# Patient Record
Sex: Female | Born: 1950
Health system: Southern US, Community
[De-identification: ages and names within clinical notes are randomized; demographics above are authoritative.]

## PROBLEM LIST (undated history)

## (undated) DIAGNOSIS — R519 Headache, unspecified: Secondary | ICD-10-CM

## (undated) DIAGNOSIS — J189 Pneumonia, unspecified organism: Secondary | ICD-10-CM

## (undated) DIAGNOSIS — F329 Major depressive disorder, single episode, unspecified: Secondary | ICD-10-CM

## (undated) DIAGNOSIS — T7840XA Allergy, unspecified, initial encounter: Secondary | ICD-10-CM

## (undated) DIAGNOSIS — H269 Unspecified cataract: Secondary | ICD-10-CM

## (undated) DIAGNOSIS — M797 Fibromyalgia: Secondary | ICD-10-CM

## (undated) DIAGNOSIS — M25512 Pain in left shoulder: Secondary | ICD-10-CM

## (undated) DIAGNOSIS — M1990 Unspecified osteoarthritis, unspecified site: Secondary | ICD-10-CM

## (undated) DIAGNOSIS — M75120 Complete rotator cuff tear or rupture of unspecified shoulder, not specified as traumatic: Secondary | ICD-10-CM

## (undated) DIAGNOSIS — I10 Essential (primary) hypertension: Secondary | ICD-10-CM

## (undated) DIAGNOSIS — E039 Hypothyroidism, unspecified: Secondary | ICD-10-CM

## (undated) DIAGNOSIS — R011 Cardiac murmur, unspecified: Secondary | ICD-10-CM

## (undated) DIAGNOSIS — F32A Depression, unspecified: Secondary | ICD-10-CM

## (undated) DIAGNOSIS — Z8619 Personal history of other infectious and parasitic diseases: Secondary | ICD-10-CM

## (undated) HISTORY — DX: Unspecified cataract: H26.9

## (undated) HISTORY — DX: Allergy, unspecified, initial encounter: T78.40XA

## (undated) HISTORY — PX: FRACTURE SURGERY: SHX138

## (undated) HISTORY — PX: SPINE SURGERY: SHX786

## (undated) HISTORY — PX: EYE SURGERY: SHX253

## (undated) HISTORY — PX: TONSILLECTOMY: SUR1361

## (undated) HISTORY — PX: BUNIONECTOMY: SHX129

## (undated) HISTORY — PX: OTHER SURGICAL HISTORY: SHX169

## (undated) HISTORY — PX: JOINT REPLACEMENT: SHX530

## (undated) HISTORY — PX: BREAST SURGERY: SHX581

## (undated) HISTORY — PX: TOTAL ABDOMINAL HYSTERECTOMY: SHX209

## (undated) HISTORY — PX: MANDIBLE FRACTURE SURGERY: SHX706

---

## 1976-07-18 HISTORY — PX: TUBAL LIGATION: SHX77

## 1979-07-19 HISTORY — PX: FRACTURE SURGERY: SHX138

## 1985-07-18 HISTORY — PX: BREAST BIOPSY: SHX20

## 2007-03-21 ENCOUNTER — Ambulatory Visit: Payer: Self-pay

## 2007-05-14 ENCOUNTER — Ambulatory Visit: Payer: Self-pay

## 2008-06-02 ENCOUNTER — Ambulatory Visit: Payer: Self-pay | Admitting: Family Medicine

## 2008-06-04 ENCOUNTER — Ambulatory Visit: Payer: Self-pay | Admitting: Family Medicine

## 2009-06-23 ENCOUNTER — Ambulatory Visit: Payer: Self-pay | Admitting: Internal Medicine

## 2009-07-18 HISTORY — PX: COLONOSCOPY: SHX174

## 2010-04-19 ENCOUNTER — Ambulatory Visit: Payer: Self-pay | Admitting: Family Medicine

## 2012-08-18 DIAGNOSIS — Z8739 Personal history of other diseases of the musculoskeletal system and connective tissue: Secondary | ICD-10-CM | POA: Insufficient documentation

## 2013-08-14 DIAGNOSIS — B999 Unspecified infectious disease: Secondary | ICD-10-CM | POA: Insufficient documentation

## 2013-12-25 ENCOUNTER — Ambulatory Visit: Payer: Self-pay | Admitting: Family Medicine

## 2014-11-03 DIAGNOSIS — E039 Hypothyroidism, unspecified: Secondary | ICD-10-CM | POA: Diagnosis not present

## 2014-11-21 DIAGNOSIS — M797 Fibromyalgia: Secondary | ICD-10-CM | POA: Diagnosis not present

## 2014-11-21 DIAGNOSIS — Z86010 Personal history of colonic polyps: Secondary | ICD-10-CM | POA: Diagnosis not present

## 2014-11-21 DIAGNOSIS — R5382 Chronic fatigue, unspecified: Secondary | ICD-10-CM | POA: Diagnosis not present

## 2014-11-21 DIAGNOSIS — I10 Essential (primary) hypertension: Secondary | ICD-10-CM | POA: Diagnosis not present

## 2014-11-21 DIAGNOSIS — B949 Sequelae of unspecified infectious and parasitic disease: Secondary | ICD-10-CM | POA: Diagnosis not present

## 2014-11-21 DIAGNOSIS — Z1239 Encounter for other screening for malignant neoplasm of breast: Secondary | ICD-10-CM | POA: Diagnosis not present

## 2014-11-21 DIAGNOSIS — E063 Autoimmune thyroiditis: Secondary | ICD-10-CM | POA: Diagnosis not present

## 2014-11-27 DIAGNOSIS — R5382 Chronic fatigue, unspecified: Secondary | ICD-10-CM | POA: Diagnosis not present

## 2014-11-27 DIAGNOSIS — Z0000 Encounter for general adult medical examination without abnormal findings: Secondary | ICD-10-CM | POA: Diagnosis not present

## 2014-11-27 DIAGNOSIS — M797 Fibromyalgia: Secondary | ICD-10-CM | POA: Diagnosis not present

## 2014-11-27 DIAGNOSIS — Z23 Encounter for immunization: Secondary | ICD-10-CM | POA: Diagnosis not present

## 2014-11-27 DIAGNOSIS — E063 Autoimmune thyroiditis: Secondary | ICD-10-CM | POA: Diagnosis not present

## 2014-11-27 DIAGNOSIS — I10 Essential (primary) hypertension: Secondary | ICD-10-CM | POA: Diagnosis not present

## 2014-11-27 DIAGNOSIS — M25512 Pain in left shoulder: Secondary | ICD-10-CM | POA: Diagnosis not present

## 2014-12-16 DIAGNOSIS — M7542 Impingement syndrome of left shoulder: Secondary | ICD-10-CM | POA: Diagnosis not present

## 2014-12-18 ENCOUNTER — Other Ambulatory Visit: Payer: Self-pay | Admitting: Unknown Physician Specialty

## 2014-12-18 DIAGNOSIS — M7542 Impingement syndrome of left shoulder: Secondary | ICD-10-CM

## 2014-12-30 ENCOUNTER — Ambulatory Visit
Admission: RE | Admit: 2014-12-30 | Discharge: 2014-12-30 | Disposition: A | Discharge status: Home / Self Care | Payer: Commercial Managed Care - HMO | Source: Ambulatory Visit | Attending: Unknown Physician Specialty | Admitting: Unknown Physician Specialty

## 2014-12-30 DIAGNOSIS — M25512 Pain in left shoulder: Secondary | ICD-10-CM | POA: Diagnosis not present

## 2014-12-30 DIAGNOSIS — M7552 Bursitis of left shoulder: Secondary | ICD-10-CM | POA: Insufficient documentation

## 2014-12-30 DIAGNOSIS — M7542 Impingement syndrome of left shoulder: Secondary | ICD-10-CM

## 2014-12-30 DIAGNOSIS — M75102 Unspecified rotator cuff tear or rupture of left shoulder, not specified as traumatic: Secondary | ICD-10-CM | POA: Diagnosis not present

## 2014-12-31 ENCOUNTER — Other Ambulatory Visit: Payer: Self-pay | Admitting: Internal Medicine

## 2014-12-31 ENCOUNTER — Encounter: Payer: Self-pay | Admitting: Internal Medicine

## 2014-12-31 DIAGNOSIS — I10 Essential (primary) hypertension: Secondary | ICD-10-CM | POA: Insufficient documentation

## 2014-12-31 DIAGNOSIS — M179 Osteoarthritis of knee, unspecified: Secondary | ICD-10-CM | POA: Insufficient documentation

## 2014-12-31 DIAGNOSIS — E079 Disorder of thyroid, unspecified: Secondary | ICD-10-CM | POA: Insufficient documentation

## 2014-12-31 DIAGNOSIS — M1990 Unspecified osteoarthritis, unspecified site: Secondary | ICD-10-CM | POA: Insufficient documentation

## 2014-12-31 DIAGNOSIS — F32A Depression, unspecified: Secondary | ICD-10-CM | POA: Insufficient documentation

## 2014-12-31 DIAGNOSIS — F329 Major depressive disorder, single episode, unspecified: Secondary | ICD-10-CM | POA: Insufficient documentation

## 2015-01-30 ENCOUNTER — Encounter: Payer: Self-pay | Admitting: Unknown Physician Specialty

## 2015-01-30 ENCOUNTER — Ambulatory Visit: Payer: Commercial Managed Care - HMO | Admitting: Anesthesiology

## 2015-01-30 ENCOUNTER — Encounter
Admission: RE | Disposition: A | Discharge status: Home / Self Care | Payer: Self-pay | Source: Ambulatory Visit | Attending: Unknown Physician Specialty

## 2015-01-30 ENCOUNTER — Ambulatory Visit
Admission: RE | Admit: 2015-01-30 | Discharge: 2015-01-30 | Disposition: A | Discharge status: Home / Self Care | Payer: Commercial Managed Care - HMO | Source: Ambulatory Visit | Attending: Unknown Physician Specialty | Admitting: Unknown Physician Specialty

## 2015-01-30 DIAGNOSIS — G8918 Other acute postprocedural pain: Secondary | ICD-10-CM | POA: Diagnosis not present

## 2015-01-30 DIAGNOSIS — Z79899 Other long term (current) drug therapy: Secondary | ICD-10-CM | POA: Diagnosis not present

## 2015-01-30 DIAGNOSIS — F329 Major depressive disorder, single episode, unspecified: Secondary | ICD-10-CM | POA: Diagnosis not present

## 2015-01-30 DIAGNOSIS — Z90710 Acquired absence of both cervix and uterus: Secondary | ICD-10-CM | POA: Insufficient documentation

## 2015-01-30 DIAGNOSIS — E079 Disorder of thyroid, unspecified: Secondary | ICD-10-CM | POA: Insufficient documentation

## 2015-01-30 DIAGNOSIS — I10 Essential (primary) hypertension: Secondary | ICD-10-CM | POA: Insufficient documentation

## 2015-01-30 DIAGNOSIS — M1990 Unspecified osteoarthritis, unspecified site: Secondary | ICD-10-CM | POA: Diagnosis not present

## 2015-01-30 DIAGNOSIS — Z8249 Family history of ischemic heart disease and other diseases of the circulatory system: Secondary | ICD-10-CM | POA: Insufficient documentation

## 2015-01-30 DIAGNOSIS — M75102 Unspecified rotator cuff tear or rupture of left shoulder, not specified as traumatic: Secondary | ICD-10-CM | POA: Diagnosis not present

## 2015-01-30 DIAGNOSIS — M75122 Complete rotator cuff tear or rupture of left shoulder, not specified as traumatic: Secondary | ICD-10-CM | POA: Diagnosis not present

## 2015-01-30 DIAGNOSIS — M7522 Bicipital tendinitis, left shoulder: Secondary | ICD-10-CM | POA: Insufficient documentation

## 2015-01-30 DIAGNOSIS — M7542 Impingement syndrome of left shoulder: Secondary | ICD-10-CM | POA: Insufficient documentation

## 2015-01-30 HISTORY — PX: SHOULDER ARTHROSCOPY WITH OPEN ROTATOR CUFF REPAIR: SHX6092

## 2015-01-30 HISTORY — DX: Major depressive disorder, single episode, unspecified: F32.9

## 2015-01-30 HISTORY — DX: Depression, unspecified: F32.A

## 2015-01-30 HISTORY — DX: Pain in left shoulder: M25.512

## 2015-01-30 HISTORY — DX: Essential (primary) hypertension: I10

## 2015-01-30 HISTORY — DX: Unspecified osteoarthritis, unspecified site: M19.90

## 2015-01-30 HISTORY — DX: Hypothyroidism, unspecified: E03.9

## 2015-01-30 HISTORY — DX: Cardiac murmur, unspecified: R01.1

## 2015-01-30 SURGERY — ARTHROSCOPY, SHOULDER WITH REPAIR, ROTATOR CUFF, OPEN
Anesthesia: Regional | Laterality: Left | Wound class: Clean

## 2015-01-30 MED ORDER — METOCLOPRAMIDE HCL 5 MG/ML IJ SOLN
INTRAMUSCULAR | Status: DC | PRN
  Administered 2015-01-30: 10 mg via INTRAVENOUS

## 2015-01-30 MED ORDER — LABETALOL HCL 5 MG/ML IV SOLN
INTRAVENOUS | Status: DC | PRN
  Administered 2015-01-30 (×4): 5 mg via INTRAVENOUS

## 2015-01-30 MED ORDER — FENTANYL CITRATE (PF) 100 MCG/2ML IJ SOLN
INTRAMUSCULAR | Status: DC | PRN
  Administered 2015-01-30: 100 ug via INTRAVENOUS
  Administered 2015-01-30: 50 ug via INTRAVENOUS

## 2015-01-30 MED ORDER — OXYCODONE-ACETAMINOPHEN 5-325 MG PO TABS: 1.0000 | ORAL_TABLET | Freq: Four times a day (QID) | ORAL | Status: DC | PRN

## 2015-01-30 MED ORDER — FENTANYL CITRATE (PF) 100 MCG/2ML IJ SOLN: 25.0000 ug | INTRAMUSCULAR | Status: DC | PRN

## 2015-01-30 MED ORDER — LIDOCAINE HCL (CARDIAC) 20 MG/ML IV SOLN
INTRAVENOUS | Status: DC | PRN
  Administered 2015-01-30: 30 mg via INTRAVENOUS

## 2015-01-30 MED ORDER — ACETAMINOPHEN 325 MG PO TABS: 325.0000 mg | ORAL_TABLET | ORAL | Status: DC | PRN

## 2015-01-30 MED ORDER — ONDANSETRON HCL 4 MG/2ML IJ SOLN
INTRAMUSCULAR | Status: DC | PRN
  Administered 2015-01-30: 4 mg via INTRAVENOUS

## 2015-01-30 MED ORDER — CEFAZOLIN SODIUM-DEXTROSE 2-3 GM-% IV SOLR
2.0000 g | Freq: Once | INTRAVENOUS | Status: AC
  Administered 2015-01-30: 2 g via INTRAVENOUS

## 2015-01-30 MED ORDER — ROPIVACAINE HCL 5 MG/ML IJ SOLN
INTRAMUSCULAR | Status: DC | PRN
  Administered 2015-01-30: 35 mL via PERINEURAL

## 2015-01-30 MED ORDER — DEXAMETHASONE SODIUM PHOSPHATE 4 MG/ML IJ SOLN
INTRAMUSCULAR | Status: DC | PRN
  Administered 2015-01-30: 4 mg via INTRAVENOUS
  Administered 2015-01-30: 4 mg via PERINEURAL

## 2015-01-30 MED ORDER — CEFAZOLIN SODIUM-DEXTROSE 2-3 GM-% IV SOLR
INTRAVENOUS | Status: DC | PRN
  Administered 2015-01-30: 2 g via INTRAVENOUS

## 2015-01-30 MED ORDER — OXYCODONE HCL 5 MG PO TABS: 5.0000 mg | ORAL_TABLET | Freq: Once | ORAL | Status: DC | PRN

## 2015-01-30 MED ORDER — MIDAZOLAM HCL 2 MG/2ML IJ SOLN
INTRAMUSCULAR | Status: DC | PRN
  Administered 2015-01-30: 2 mg via INTRAVENOUS
  Administered 2015-01-30: 1 mg via INTRAVENOUS

## 2015-01-30 MED ORDER — LACTATED RINGERS IV SOLN
INTRAVENOUS | Status: DC | PRN
  Administered 2015-01-30: 11100 mL

## 2015-01-30 MED ORDER — PROPOFOL 10 MG/ML IV BOLUS
INTRAVENOUS | Status: DC | PRN
  Administered 2015-01-30: 200 mg via INTRAVENOUS

## 2015-01-30 MED ORDER — ACETAMINOPHEN 160 MG/5ML PO SOLN: 325.0000 mg | ORAL | Status: DC | PRN

## 2015-01-30 MED ORDER — EPHEDRINE SULFATE 50 MG/ML IJ SOLN
INTRAMUSCULAR | Status: DC | PRN
  Administered 2015-01-30 (×6): 5 mg via INTRAVENOUS

## 2015-01-30 MED ORDER — LIDOCAINE HCL (CARDIAC) 20 MG/ML IV SOLN: INTRAVENOUS | Status: DC | PRN

## 2015-01-30 MED ORDER — ONDANSETRON HCL 4 MG/2ML IJ SOLN: 4.0000 mg | Freq: Once | INTRAMUSCULAR | Status: DC | PRN

## 2015-01-30 MED ORDER — GLYCOPYRROLATE 0.2 MG/ML IJ SOLN
INTRAMUSCULAR | Status: DC | PRN
  Administered 2015-01-30: .1 mg via INTRAVENOUS

## 2015-01-30 MED ORDER — LACTATED RINGERS IV SOLN
INTRAVENOUS | Status: DC
  Administered 2015-01-30 (×2): via INTRAVENOUS

## 2015-01-30 MED ORDER — OXYCODONE HCL 5 MG/5ML PO SOLN: 5.0000 mg | Freq: Once | ORAL | Status: DC | PRN

## 2015-01-30 SURGICAL SUPPLY — 77 items
ADAPTER IRRIG TUBE 2 SPIKE SOL (ADAPTER) ×6 IMPLANT
ANCHOR ALL-SUT Q-FIX 2.8 (Anchor) ×6 IMPLANT
ANCHOR SUT 5.5 SPEEDSCREW (Screw) ×6 IMPLANT
ARTHROWAND PARAGON T2 (SURGICAL WAND)
BLADE SURG 15 STRL LF DISP TIS (BLADE) IMPLANT
BLADE SURG 15 STRL SS (BLADE)
BUR BR 5.5 12 FLUTE (BURR) ×3 IMPLANT
BUR HOODED 3.0 ABRASION (BURR) IMPLANT
BUR RADIUS 4.0X18.5 (BURR) IMPLANT
BUR RADIUS 5.5 (BURR) IMPLANT
CANNULA 8.5X75 THRED (CANNULA) IMPLANT
CANNULA SHAVER 8MMX76MM (CANNULA) IMPLANT
CAP LOCK ULTRA CANNULA (MISCELLANEOUS) ×3 IMPLANT
CUTTER CANN W/HOLE 4.5 (CUTTER) ×3 IMPLANT
CUTTER SLOTTED WHISKER 4.0 (BURR) ×3 IMPLANT
DRAPE STERI 35X30 U-POUCH (DRAPES) ×3 IMPLANT
DURAPREP 26ML APPLICATOR (WOUND CARE) ×3 IMPLANT
GAUZE SPONGE 4X4 12PLY STRL (GAUZE/BANDAGES/DRESSINGS) ×3 IMPLANT
GLOVE BIO SURGEON STRL SZ7.5 (GLOVE) ×6 IMPLANT
GLOVE BIO SURGEON STRL SZ8 (GLOVE) ×3 IMPLANT
GLOVE INDICATOR 8.0 STRL GRN (GLOVE) ×6 IMPLANT
GOWN STRL REIN 2XL LVL4 (GOWN DISPOSABLE) IMPLANT
GOWN STRL REUS W/ TWL LRG LVL3 (GOWN DISPOSABLE) ×2 IMPLANT
GOWN STRL REUS W/TWL LRG LVL3 (GOWN DISPOSABLE) ×4
IV LACTATED RINGER IRRG 3000ML (IV SOLUTION) ×8
IV LR IRRIG 3000ML ARTHROMATIC (IV SOLUTION) ×4 IMPLANT
KIT SHOULDER TRACTION (DRAPES) ×3 IMPLANT
KIT SUTURE 2.8 Q-FIX DISP (MISCELLANEOUS) ×3 IMPLANT
MANIFOLD 4PT FOR NEPTUNE1 (MISCELLANEOUS) ×3 IMPLANT
NEEDLE 18GX1X1/2 (RX/OR ONLY) (NEEDLE) ×3 IMPLANT
NEEDLE MAYO CATGUT SZ 1.5 (NEEDLE)
NEEDLE MAYO CATGUT SZ 2 (NEEDLE) IMPLANT
NEEDLE SPNL 18GX3.5 QUINCKE PK (NEEDLE) IMPLANT
PACK ARTHROSCOPY SHOULDER (MISCELLANEOUS) IMPLANT
PAD GROUND ADULT SPLIT (MISCELLANEOUS) ×3 IMPLANT
PASSER SUT CAPTURE FIRST (SUTURE) ×3 IMPLANT
SET TUBE SUCT SHAVER OUTFL 24K (TUBING) ×3 IMPLANT
SLING ULTRA II M (MISCELLANEOUS) ×3 IMPLANT
SOL PREP PVP 2OZ (MISCELLANEOUS) ×3
SOLUTION PREP PVP 2OZ (MISCELLANEOUS) ×1 IMPLANT
STAPLER SKIN PROX 35W (STAPLE) ×3 IMPLANT
STRAP BODY AND KNEE 60X3 (MISCELLANEOUS) ×9 IMPLANT
SUCTION FRAZIER TIP 10 FR DISP (SUCTIONS) ×3 IMPLANT
SUT ETHIBOND NAB CT1 #1 30IN (SUTURE) ×3 IMPLANT
SUT ETHILON 3-0 FS-10 30 BLK (SUTURE) ×6
SUT MAGNUM WIRE 2 (SUTURE) IMPLANT
SUT PDS AB 1 CT1 27 (SUTURE) IMPLANT
SUT PDSII 0 (SUTURE) IMPLANT
SUT PERFECTPASSER WHITE CART (SUTURE) IMPLANT
SUT PROLENE 2 0 CT2 30 (SUTURE) IMPLANT
SUT SMART STITCH CARTRIDGE (SUTURE) IMPLANT
SUT TICRON 2-0 30IN 311381 (SUTURE) IMPLANT
SUT VIC AB 0 CT1 36 (SUTURE) IMPLANT
SUT VIC AB 0 CT2 27 (SUTURE) ×3 IMPLANT
SUT VIC AB 2-0 CT1 27 (SUTURE) ×2
SUT VIC AB 2-0 CT1 TAPERPNT 27 (SUTURE) ×1 IMPLANT
SUT VIC AB 2-0 CT2 27 (SUTURE) IMPLANT
SUT VIC AB 2-0 SH 27 (SUTURE)
SUT VIC AB 2-0 SH 27XBRD (SUTURE) IMPLANT
SUT VIC AB 3-0 SH 27 (SUTURE)
SUT VIC AB 3-0 SH 27X BRD (SUTURE) IMPLANT
SUTURE EHLN 3-0 FS-10 30 BLK (SUTURE) ×2 IMPLANT
SUTURE MAGNUM WIRE 2X48 BLK (SUTURE) IMPLANT
SYRINGE 10CC LL (SYRINGE) ×3 IMPLANT
TAPE MICROFOAM 4IN (TAPE) ×3 IMPLANT
TUBING ARTHRO INFLOW-ONLY STRL (TUBING) ×3 IMPLANT
WAND 30 DEG SABER W/CORD (SURGICAL WAND) IMPLANT
WAND ARTHRO PARAGON T2 (SURGICAL WAND) IMPLANT
WAND COVAC 50 IFS (MISCELLANEOUS) IMPLANT
WAND COVATOR 20 (MISCELLANEOUS) IMPLANT
WAND HAND CNTRL MULTIVAC 50 (MISCELLANEOUS) IMPLANT
WAND HAND CNTRL MULTIVAC 90 (MISCELLANEOUS) IMPLANT
WAND MEGAVAC 90 (MISCELLANEOUS) ×3 IMPLANT
WAND TENDON TOPAZ 0 ANGL (MISCELLANEOUS) IMPLANT
WAND TOPAZ EPF  WAS Q (MISCELLANEOUS)
WAND TOPAZ EPF WAS Q (MISCELLANEOUS) IMPLANT
WIRE MAGNUM (SUTURE) IMPLANT

## 2015-01-30 NOTE — Anesthesia Postprocedure Evaluation (Signed)
  Anesthesia Post-op Note  Patient: Deanna Johnson  Procedure(s) Performed: Procedure(s) with comments: SHOULDER ARTHROSCOPY WITH POSS OPEN ROTATOR CUFF REPAIR (Left) - Release of long head of biceps tendon Subacromial decompression Mini open rotator cuff repair  Anesthesia type:General LMA, Regional  Patient location: PACU  Post pain: Pain level controlled  Post assessment: Post-op Vital signs reviewed, Patient's Cardiovascular Status Stable, Respiratory Function Stable, Patent Airway and No signs of Nausea or vomiting  Post vital signs: Reviewed and stable  Last Vitals:  Filed Vitals:   01/30/15 1100  BP: 123/59  Pulse: 94  Temp:   Resp: 15    Level of consciousness: awake, alert  and patient cooperative  Complications: No apparent anesthesia complications

## 2015-01-30 NOTE — Anesthesia Procedure Notes (Signed)
Anesthesia Regional Block:  Interscalene brachial plexus block  Pre-Anesthetic Checklist: ,, timeout performed, Correct Patient, Correct Site, Correct Laterality, Correct Procedure, Correct Position, site marked, Risks and benefits discussed,  Surgical consent,  Pre-op evaluation,  At surgeon's request and post-op pain management  Laterality: Left  Prep: chloraprep       Needles:  Injection technique: Single-shot  Needle Type: Stimiplex     Needle Length: 10cm 10 cm Needle Gauge: 21 and 21 G    Additional Needles:  Procedures: ultrasound guided (picture in chart) Interscalene brachial plexus block Narrative:  Start time: 01/30/2015 6:59 AM End time: 01/30/2015 6:53 AM Injection made incrementally with aspirations every 5 mL.  Performed by: Personally  Anesthesiologist: Ronelle Nigh  Additional Notes: Functioning IV was confirmed and monitors applied. Ultrasound guidance: relevant anatomy identified, needle position confirmed, local anesthetic spread visualized around nerve(s)., vascular puncture avoided.  Image printed for medical record.  Negative aspiration and no paresthesias; incremental administration of local anesthetic. The patient tolerated the procedure well. Vitals signes recorded in RN notes.

## 2015-01-30 NOTE — Op Note (Signed)
01/30/2015  11:54 AM  Patient:   Deanna Johnson  Pre-Op Diagnosis:   IMPINGEMENT SYNDROME LEFT SHOULDER M75.42  Postoperative diagnosis: Impingement right shoulder plus bicipital tendinitis and small rotator cuff tear  Procedure: Arthroscopic subacromial decompression plus arthroscopic release of the long head of biceps tendon followed by mini incision rotator cuff repair left shoulder  Anesthesia: General endotracheal with interscalene block placed preoperatively by the anesthesiologist.  Findings: As above.   Complications: None  Estimated blood loss: negligable  Tourniquet time: None  Drains: None   Brief clinical note:  The patient's symptoms had progressed despite medications, activity modification, etc. The patient's history and examination were consistent with impingement symptomatology right shoulder and probable rotator cuff tear. These findings were confirmed by MRI scan. It was thought that the patient did have a small cuff tear on the MRI. The patient was admitted at this time for definitive management of these shoulder symptoms.  Procedure: The patient was brought into the operating room and placed in the supine position. The patient then underwent general endotracheal intubation and anesthesia before being repositioned in the lateral decubitus position. The left shoulder and upper extremity were prepped and draped in usual fashion. The shoulder was suspended with the Acufex shoulder suspension device. 10 pounds of traction was utilized. Preoperative antibiotics were administered. A timeout was performed . A posterior portal was created. The arthroscope was introduced into the glenohumeral joint. The joint was distended with lactated Ringer's. The glenohumeral joint was thoroughly inspected revealing grade 2-3 chondral changes in the glenoid and on the humeral head. These lesions were debrided with a turbo whisker.. An anterior portal was created. An  ArthroCare wand was inserted and used to obtain hemostasis as well as to perform a limited synovectomy.The biceps tendon was evaluated and then released from its labral attachment using an ArthroCare wand.  The scope was repositioned through the posterior portal into the subacromial space. A separate lateral portal was created using an outside-in technique. An ArthroCare 90 wand followed by a 4.0 full-radius resector was introduced and used to perform a subtotal bursectomy. The ArthroCare wand was then inserted and used to remove the periosteal tissue off the undersurface of the anterior third of the acromion as well as to recess the coracoacromial ligament from its attachment along the anterior and lateral margins of the acromion.   With the scope in the lateral portal a 5.30mm acromionizing bur was introduced through the posterior portal and used to perform the decompression by removing the undersurface of the anterior third of the acromion. At this time I was able to find a very small complete tear in the anterior portion of the supraspinatus insertion onto the greater tuberosity. The instruments were removed from the subacromial space.  An approximately 3 to 4 cm incision was made over the anterolateral aspect of the shoulder.   This incision was carried down through the subcutaneous tissues onto the deltoid. The deltoid was divided in line with its fibers to provide access into the subacromial space. The rotator cuff tear was readily identified. The margins were debrided. I then used a bur to lightly decorticate the greater tuberosity in the area of intended reattachment of the torn cuff. I used a small awl to make ventcement holes in the greater tuberosity at the area of the intended repair. The tear was repaired using 2 Q-Fix horizontal mattress sutures that were tied down and then crisscrossed over to 2 laterally placed 5.5 speed screws.   The wound was  copiously irrigated with sterile saline solution  before the deltoid was repaired to bone with #1 ethibond sutures.  Deltoid interval closed with 0 vicryl. The subcutaneous tissues were closed  using 2-0 Vicryl interrupted sutures before the skin was closed with 4-0 nylon sutures in vertical mattress fashion. The portal sites also were closed using 4-O nylon sutures. A sterile bulky dressing was applied to the shoulder followed by a shoulder immobilizer. The patient was then awakened, extubated, and returned to the recovery room in satisfactory condition after tolerating the procedure well.  Blood loss was negligible.

## 2015-01-30 NOTE — Anesthesia Preprocedure Evaluation (Signed)
Anesthesia Evaluation  Patient identified by MRN, date of birth, ID band  Reviewed: Allergy & Precautions, H&P , NPO status , Patient's Chart, lab work & pertinent test results  Airway Mallampati: II  TM Distance: >3 FB Neck ROM: full    Dental no notable dental hx.    Pulmonary former smoker,    Pulmonary exam normal       Cardiovascular hypertension, Rhythm:regular Rate:Normal     Neuro/Psych PSYCHIATRIC DISORDERS    GI/Hepatic   Endo/Other  Hypothyroidism Morbid obesity  Renal/GU      Musculoskeletal   Abdominal   Peds  Hematology   Anesthesia Other Findings   Reproductive/Obstetrics                             Anesthesia Physical Anesthesia Plan  ASA: II  Anesthesia Plan: General LMA and Regional   Post-op Pain Management: GA combined w/ Regional for post-op pain   Induction:   Airway Management Planned:   Additional Equipment:   Intra-op Plan:   Post-operative Plan:   Informed Consent: I have reviewed the patients History and Physical, chart, labs and discussed the procedure including the risks, benefits and alternatives for the proposed anesthesia with the patient or authorized representative who has indicated his/her understanding and acceptance.     Plan Discussed with: CRNA  Anesthesia Plan Comments:         Anesthesia Quick Evaluation

## 2015-01-30 NOTE — Progress Notes (Signed)
Assisted Mike Stella ANMD with left, interscalene  block. Side rails up, monitors on throughout procedure. See vital signs in flow sheet. Tolerated Procedure well.  

## 2015-01-30 NOTE — Transfer of Care (Signed)
Immediate Anesthesia Transfer of Care Note  Patient: Deanna Johnson  Procedure(s) Performed: Procedure(s) with comments: SHOULDER ARTHROSCOPY WITH POSS OPEN ROTATOR CUFF REPAIR (Left) - Release of long head of biceps tendon Subacromial decompression Mini open rotator cuff repair  Patient Location: PACU  Anesthesia Type: General LMA, Regional  Level of Consciousness: awake, alert  and patient cooperative  Airway and Oxygen Therapy: Patient Spontanous Breathing and Patient connected to supplemental oxygen  Post-op Assessment: Post-op Vital signs reviewed, Patient's Cardiovascular Status Stable, Respiratory Function Stable, Patent Airway and No signs of Nausea or vomiting  Post-op Vital Signs: Reviewed and stable  Complications: No apparent anesthesia complications

## 2015-01-30 NOTE — Discharge Instructions (Signed)
General Anesthesia, Care After Refer to this sheet in the next few weeks. These instructions provide you with information on caring for yourself after your procedure. Your health care provider may also give you more specific instructions. Your treatment has been planned according to current medical practices, but problems sometimes occur. Call your health care provider if you have any problems or questions after your procedure. WHAT TO EXPECT AFTER THE PROCEDURE After the procedure, it is typical to experience:  Sleepiness.  Nausea and vomiting. HOME CARE INSTRUCTIONS  For the first 24 hours after general anesthesia:  Have a responsible person with you.  Do not drive a car. If you are alone, do not take public transportation.  Do not drink alcohol.  Do not take medicine that has not been prescribed by your health care provider.  Do not sign important papers or make important decisions.  You may resume a normal diet and activities as directed by your health care provider.  Change bandages (dressings) as directed.  If you have questions or problems that seem related to general anesthesia, call the hospital and ask for the anesthetist or anesthesiologist on call. SEEK MEDICAL CARE IF:  You have nausea and vomiting that continue the day after anesthesia.  You develop a rash. SEEK IMMEDIATE MEDICAL CARE IF:   You have difficulty breathing.  You have chest pain.  You have any allergic problems. Document Released: 10/10/2000 Document Revised: 07/09/2013 Document Reviewed: 01/17/2013 [facility] Patient Information 2015 [place], [place]. This information is not intended to replace advice given to you by your health care provider. Make sure you discuss any questions you have with your health care provider. General Anesthesia, Care After Refer to this sheet in the next few weeks. These instructions provide you with information on caring for yourself after your procedure. Your health care  provider may also give you more specific instructions. Your treatment has been planned according to current medical practices, but problems sometimes occur. Call your health care provider if you have any problems or questions after your procedure. WHAT TO EXPECT AFTER THE PROCEDURE After the procedure, it is typical to experience:  Sleepiness.  Nausea and vomiting. HOME CARE INSTRUCTIONS  For the first 24 hours after general anesthesia:  Have a responsible person with you.  Do not drive a car. If you are alone, do not take public transportation.  Do not drink alcohol.  Do not take medicine that has not been prescribed by your health care provider.  Do not sign important papers or make important decisions.  You may resume a normal diet and activities as directed by your health care provider.  Change bandages (dressings) as directed.  If you have questions or problems that seem related to general anesthesia, call the hospital and ask for the anesthetist or anesthesiologist on call. SEEK MEDICAL CARE IF:  You have nausea and vomiting that continue the day after anesthesia.  You develop a rash. SEEK IMMEDIATE MEDICAL CARE IF:   You have difficulty breathing.  You have chest pain.  You have any allergic problems.  Document Released: 10/10/2000 Document Revised: 07/09/2013 Document Reviewed: 01/17/2013 [facility] Patient Information 2015 [place], [place]. This information is not intended to replace advice given to you by your health care provider. Make sure you discuss any questions you have with your health care provider. Diet: As you were doing prior to hospitalization   Shower:  May shower but keep the wounds dry, use an occlusive plastic wrap or extremity protector. NO SOAKING IN  TUB.   Dressing: Leave dressing in place  Activity:  Increase activity slowly as tolerated. Can drive when comfortable.    Sling: leave in place-   RTC: 1 week  Ice pack as needed   To  prevent constipation: you may use a stool softener such as - Miralax (over the counter) for constipation as needed.    To prevent venous clotting Take one 81 mg. ASA tablet  2X per day for about 2 weeks post surgery.  Precautions:  If you experience chest pain or shortness of breath - call 911 immediately for transfer to the hospital emergency department!!  If you develop a fever greater that 101 F, purulent drainage from wound, increased redness or drainage from wound, or calf pain -- Call the office at [phone]                                             Follow- Up Appointment:  Please call for an appointment to be seen in 1 wk.

## 2015-03-16 DIAGNOSIS — M6281 Muscle weakness (generalized): Secondary | ICD-10-CM | POA: Diagnosis not present

## 2015-03-16 DIAGNOSIS — M25612 Stiffness of left shoulder, not elsewhere classified: Secondary | ICD-10-CM | POA: Diagnosis not present

## 2015-03-16 DIAGNOSIS — Z9889 Other specified postprocedural states: Secondary | ICD-10-CM | POA: Diagnosis not present

## 2015-03-16 DIAGNOSIS — M25512 Pain in left shoulder: Secondary | ICD-10-CM | POA: Diagnosis not present

## 2015-03-18 DIAGNOSIS — M25512 Pain in left shoulder: Secondary | ICD-10-CM | POA: Diagnosis not present

## 2015-03-18 DIAGNOSIS — Z9889 Other specified postprocedural states: Secondary | ICD-10-CM | POA: Diagnosis not present

## 2015-03-18 DIAGNOSIS — M25612 Stiffness of left shoulder, not elsewhere classified: Secondary | ICD-10-CM | POA: Diagnosis not present

## 2015-03-18 DIAGNOSIS — M6281 Muscle weakness (generalized): Secondary | ICD-10-CM | POA: Diagnosis not present

## 2015-03-20 DIAGNOSIS — M25512 Pain in left shoulder: Secondary | ICD-10-CM | POA: Diagnosis not present

## 2015-03-20 DIAGNOSIS — M25612 Stiffness of left shoulder, not elsewhere classified: Secondary | ICD-10-CM | POA: Diagnosis not present

## 2015-03-20 DIAGNOSIS — M6281 Muscle weakness (generalized): Secondary | ICD-10-CM | POA: Diagnosis not present

## 2015-03-20 DIAGNOSIS — Z9889 Other specified postprocedural states: Secondary | ICD-10-CM | POA: Diagnosis not present

## 2015-03-24 DIAGNOSIS — Z9889 Other specified postprocedural states: Secondary | ICD-10-CM | POA: Diagnosis not present

## 2015-03-24 DIAGNOSIS — M6281 Muscle weakness (generalized): Secondary | ICD-10-CM | POA: Diagnosis not present

## 2015-03-24 DIAGNOSIS — M25612 Stiffness of left shoulder, not elsewhere classified: Secondary | ICD-10-CM | POA: Diagnosis not present

## 2015-03-24 DIAGNOSIS — M25512 Pain in left shoulder: Secondary | ICD-10-CM | POA: Diagnosis not present

## 2015-03-26 DIAGNOSIS — M6281 Muscle weakness (generalized): Secondary | ICD-10-CM | POA: Diagnosis not present

## 2015-03-26 DIAGNOSIS — M25512 Pain in left shoulder: Secondary | ICD-10-CM | POA: Diagnosis not present

## 2015-03-26 DIAGNOSIS — Z9889 Other specified postprocedural states: Secondary | ICD-10-CM | POA: Diagnosis not present

## 2015-03-26 DIAGNOSIS — M25612 Stiffness of left shoulder, not elsewhere classified: Secondary | ICD-10-CM | POA: Diagnosis not present

## 2015-04-01 DIAGNOSIS — Z9889 Other specified postprocedural states: Secondary | ICD-10-CM | POA: Diagnosis not present

## 2015-04-01 DIAGNOSIS — M6281 Muscle weakness (generalized): Secondary | ICD-10-CM | POA: Diagnosis not present

## 2015-04-01 DIAGNOSIS — M25612 Stiffness of left shoulder, not elsewhere classified: Secondary | ICD-10-CM | POA: Diagnosis not present

## 2015-04-06 DIAGNOSIS — M6281 Muscle weakness (generalized): Secondary | ICD-10-CM | POA: Diagnosis not present

## 2015-04-06 DIAGNOSIS — M25612 Stiffness of left shoulder, not elsewhere classified: Secondary | ICD-10-CM | POA: Diagnosis not present

## 2015-04-06 DIAGNOSIS — Z9889 Other specified postprocedural states: Secondary | ICD-10-CM | POA: Diagnosis not present

## 2015-04-06 DIAGNOSIS — M25512 Pain in left shoulder: Secondary | ICD-10-CM | POA: Diagnosis not present

## 2015-04-08 DIAGNOSIS — M25512 Pain in left shoulder: Secondary | ICD-10-CM | POA: Diagnosis not present

## 2015-04-08 DIAGNOSIS — M25612 Stiffness of left shoulder, not elsewhere classified: Secondary | ICD-10-CM | POA: Diagnosis not present

## 2015-04-08 DIAGNOSIS — Z9889 Other specified postprocedural states: Secondary | ICD-10-CM | POA: Diagnosis not present

## 2015-04-08 DIAGNOSIS — M6281 Muscle weakness (generalized): Secondary | ICD-10-CM | POA: Diagnosis not present

## 2015-04-10 DIAGNOSIS — Z9889 Other specified postprocedural states: Secondary | ICD-10-CM | POA: Diagnosis not present

## 2015-04-10 DIAGNOSIS — M6281 Muscle weakness (generalized): Secondary | ICD-10-CM | POA: Diagnosis not present

## 2015-04-10 DIAGNOSIS — M25512 Pain in left shoulder: Secondary | ICD-10-CM | POA: Diagnosis not present

## 2015-04-10 DIAGNOSIS — M25612 Stiffness of left shoulder, not elsewhere classified: Secondary | ICD-10-CM | POA: Diagnosis not present

## 2015-04-13 DIAGNOSIS — M6281 Muscle weakness (generalized): Secondary | ICD-10-CM | POA: Diagnosis not present

## 2015-04-13 DIAGNOSIS — M25512 Pain in left shoulder: Secondary | ICD-10-CM | POA: Diagnosis not present

## 2015-04-13 DIAGNOSIS — M25612 Stiffness of left shoulder, not elsewhere classified: Secondary | ICD-10-CM | POA: Diagnosis not present

## 2015-04-13 DIAGNOSIS — Z9889 Other specified postprocedural states: Secondary | ICD-10-CM | POA: Diagnosis not present

## 2015-04-17 DIAGNOSIS — M25512 Pain in left shoulder: Secondary | ICD-10-CM | POA: Diagnosis not present

## 2015-04-17 DIAGNOSIS — M6281 Muscle weakness (generalized): Secondary | ICD-10-CM | POA: Diagnosis not present

## 2015-04-17 DIAGNOSIS — Z9889 Other specified postprocedural states: Secondary | ICD-10-CM | POA: Diagnosis not present

## 2015-04-17 DIAGNOSIS — M25612 Stiffness of left shoulder, not elsewhere classified: Secondary | ICD-10-CM | POA: Diagnosis not present

## 2015-04-20 DIAGNOSIS — M25512 Pain in left shoulder: Secondary | ICD-10-CM | POA: Diagnosis not present

## 2015-04-20 DIAGNOSIS — Z9889 Other specified postprocedural states: Secondary | ICD-10-CM | POA: Diagnosis not present

## 2015-04-20 DIAGNOSIS — M6281 Muscle weakness (generalized): Secondary | ICD-10-CM | POA: Diagnosis not present

## 2015-04-20 DIAGNOSIS — M25612 Stiffness of left shoulder, not elsewhere classified: Secondary | ICD-10-CM | POA: Diagnosis not present

## 2015-04-23 DIAGNOSIS — M25512 Pain in left shoulder: Secondary | ICD-10-CM | POA: Diagnosis not present

## 2015-04-23 DIAGNOSIS — M25612 Stiffness of left shoulder, not elsewhere classified: Secondary | ICD-10-CM | POA: Diagnosis not present

## 2015-04-23 DIAGNOSIS — M6281 Muscle weakness (generalized): Secondary | ICD-10-CM | POA: Diagnosis not present

## 2015-04-23 DIAGNOSIS — Z9889 Other specified postprocedural states: Secondary | ICD-10-CM | POA: Diagnosis not present

## 2015-04-27 DIAGNOSIS — Z9889 Other specified postprocedural states: Secondary | ICD-10-CM | POA: Diagnosis not present

## 2015-04-27 DIAGNOSIS — M25512 Pain in left shoulder: Secondary | ICD-10-CM | POA: Diagnosis not present

## 2015-04-27 DIAGNOSIS — M6281 Muscle weakness (generalized): Secondary | ICD-10-CM | POA: Diagnosis not present

## 2015-04-27 DIAGNOSIS — M25612 Stiffness of left shoulder, not elsewhere classified: Secondary | ICD-10-CM | POA: Diagnosis not present

## 2015-04-29 DIAGNOSIS — M25612 Stiffness of left shoulder, not elsewhere classified: Secondary | ICD-10-CM | POA: Diagnosis not present

## 2015-04-29 DIAGNOSIS — M6281 Muscle weakness (generalized): Secondary | ICD-10-CM | POA: Diagnosis not present

## 2015-04-29 DIAGNOSIS — M25512 Pain in left shoulder: Secondary | ICD-10-CM | POA: Diagnosis not present

## 2015-04-29 DIAGNOSIS — Z9889 Other specified postprocedural states: Secondary | ICD-10-CM | POA: Diagnosis not present

## 2015-05-01 DIAGNOSIS — M25512 Pain in left shoulder: Secondary | ICD-10-CM | POA: Diagnosis not present

## 2015-05-01 DIAGNOSIS — M6281 Muscle weakness (generalized): Secondary | ICD-10-CM | POA: Diagnosis not present

## 2015-05-01 DIAGNOSIS — Z9889 Other specified postprocedural states: Secondary | ICD-10-CM | POA: Diagnosis not present

## 2015-05-01 DIAGNOSIS — M25612 Stiffness of left shoulder, not elsewhere classified: Secondary | ICD-10-CM | POA: Diagnosis not present

## 2015-05-04 DIAGNOSIS — Z9889 Other specified postprocedural states: Secondary | ICD-10-CM | POA: Diagnosis not present

## 2015-05-04 DIAGNOSIS — M25612 Stiffness of left shoulder, not elsewhere classified: Secondary | ICD-10-CM | POA: Diagnosis not present

## 2015-05-04 DIAGNOSIS — M6281 Muscle weakness (generalized): Secondary | ICD-10-CM | POA: Diagnosis not present

## 2015-05-04 DIAGNOSIS — M25512 Pain in left shoulder: Secondary | ICD-10-CM | POA: Diagnosis not present

## 2015-05-08 DIAGNOSIS — M75121 Complete rotator cuff tear or rupture of right shoulder, not specified as traumatic: Secondary | ICD-10-CM | POA: Diagnosis not present

## 2015-05-08 DIAGNOSIS — Z9889 Other specified postprocedural states: Secondary | ICD-10-CM | POA: Diagnosis not present

## 2015-05-11 DIAGNOSIS — M25512 Pain in left shoulder: Secondary | ICD-10-CM | POA: Diagnosis not present

## 2015-05-11 DIAGNOSIS — M6281 Muscle weakness (generalized): Secondary | ICD-10-CM | POA: Diagnosis not present

## 2015-05-11 DIAGNOSIS — Z9889 Other specified postprocedural states: Secondary | ICD-10-CM | POA: Diagnosis not present

## 2015-05-11 DIAGNOSIS — M25612 Stiffness of left shoulder, not elsewhere classified: Secondary | ICD-10-CM | POA: Diagnosis not present

## 2015-05-13 DIAGNOSIS — M25512 Pain in left shoulder: Secondary | ICD-10-CM | POA: Diagnosis not present

## 2015-05-13 DIAGNOSIS — M25612 Stiffness of left shoulder, not elsewhere classified: Secondary | ICD-10-CM | POA: Diagnosis not present

## 2015-05-13 DIAGNOSIS — Z9889 Other specified postprocedural states: Secondary | ICD-10-CM | POA: Diagnosis not present

## 2015-05-13 DIAGNOSIS — M6281 Muscle weakness (generalized): Secondary | ICD-10-CM | POA: Diagnosis not present

## 2015-05-15 DIAGNOSIS — Z9889 Other specified postprocedural states: Secondary | ICD-10-CM | POA: Diagnosis not present

## 2015-05-15 DIAGNOSIS — M6281 Muscle weakness (generalized): Secondary | ICD-10-CM | POA: Diagnosis not present

## 2015-05-15 DIAGNOSIS — M25612 Stiffness of left shoulder, not elsewhere classified: Secondary | ICD-10-CM | POA: Diagnosis not present

## 2015-05-15 DIAGNOSIS — M25512 Pain in left shoulder: Secondary | ICD-10-CM | POA: Diagnosis not present

## 2015-11-09 ENCOUNTER — Other Ambulatory Visit: Payer: Self-pay | Admitting: Internal Medicine

## 2015-11-09 DIAGNOSIS — Z1231 Encounter for screening mammogram for malignant neoplasm of breast: Secondary | ICD-10-CM

## 2015-11-16 ENCOUNTER — Other Ambulatory Visit: Payer: Self-pay | Admitting: Internal Medicine

## 2015-11-16 ENCOUNTER — Ambulatory Visit
Admission: RE | Admit: 2015-11-16 | Discharge: 2015-11-16 | Disposition: A | Discharge status: Home / Self Care | Payer: Commercial Managed Care - HMO | Source: Ambulatory Visit | Attending: Internal Medicine | Admitting: Internal Medicine

## 2015-11-16 DIAGNOSIS — R5381 Other malaise: Secondary | ICD-10-CM | POA: Diagnosis not present

## 2015-11-16 DIAGNOSIS — G909 Disorder of the autonomic nervous system, unspecified: Secondary | ICD-10-CM | POA: Diagnosis not present

## 2015-11-16 DIAGNOSIS — Z1231 Encounter for screening mammogram for malignant neoplasm of breast: Secondary | ICD-10-CM | POA: Insufficient documentation

## 2015-11-16 DIAGNOSIS — R5383 Other fatigue: Secondary | ICD-10-CM | POA: Diagnosis not present

## 2015-11-16 DIAGNOSIS — M2550 Pain in unspecified joint: Secondary | ICD-10-CM | POA: Diagnosis not present

## 2015-11-16 DIAGNOSIS — M549 Dorsalgia, unspecified: Secondary | ICD-10-CM | POA: Diagnosis not present

## 2015-11-30 NOTE — Telephone Encounter (Signed)
pts coming in on 6/7 for a check up on thryoid and 8/28 for her cpe

## 2015-12-23 ENCOUNTER — Ambulatory Visit: Payer: Self-pay | Admitting: Internal Medicine

## 2016-02-26 ENCOUNTER — Other Ambulatory Visit: Payer: Self-pay | Admitting: Internal Medicine

## 2016-03-02 DIAGNOSIS — M5442 Lumbago with sciatica, left side: Secondary | ICD-10-CM | POA: Diagnosis not present

## 2016-03-02 DIAGNOSIS — M545 Low back pain: Secondary | ICD-10-CM | POA: Diagnosis not present

## 2016-03-02 DIAGNOSIS — M5136 Other intervertebral disc degeneration, lumbar region: Secondary | ICD-10-CM | POA: Diagnosis not present

## 2016-03-05 ENCOUNTER — Other Ambulatory Visit: Payer: Self-pay | Admitting: Internal Medicine

## 2016-03-14 ENCOUNTER — Encounter: Payer: Self-pay | Admitting: Internal Medicine

## 2016-03-19 ENCOUNTER — Other Ambulatory Visit: Payer: Self-pay | Admitting: Internal Medicine

## 2016-04-11 ENCOUNTER — Ambulatory Visit (INDEPENDENT_AMBULATORY_CARE_PROVIDER_SITE_OTHER): Payer: Commercial Managed Care - HMO | Admitting: Internal Medicine

## 2016-04-11 ENCOUNTER — Encounter: Payer: Self-pay | Admitting: Internal Medicine

## 2016-04-11 VITALS — BP 118/78 | HR 84 | Resp 16 | Ht 61.0 in | Wt 219.0 lb

## 2016-04-11 DIAGNOSIS — Z0000 Encounter for general adult medical examination without abnormal findings: Secondary | ICD-10-CM | POA: Diagnosis not present

## 2016-04-11 DIAGNOSIS — E034 Atrophy of thyroid (acquired): Secondary | ICD-10-CM

## 2016-04-11 DIAGNOSIS — F3341 Major depressive disorder, recurrent, in partial remission: Secondary | ICD-10-CM | POA: Diagnosis not present

## 2016-04-11 DIAGNOSIS — Z8739 Personal history of other diseases of the musculoskeletal system and connective tissue: Secondary | ICD-10-CM | POA: Diagnosis not present

## 2016-04-11 DIAGNOSIS — Z1159 Encounter for screening for other viral diseases: Secondary | ICD-10-CM

## 2016-04-11 DIAGNOSIS — Z860100 Personal history of colon polyps, unspecified: Secondary | ICD-10-CM | POA: Insufficient documentation

## 2016-04-11 DIAGNOSIS — I10 Essential (primary) hypertension: Secondary | ICD-10-CM

## 2016-04-11 DIAGNOSIS — E038 Other specified hypothyroidism: Secondary | ICD-10-CM

## 2016-04-11 DIAGNOSIS — M7542 Impingement syndrome of left shoulder: Secondary | ICD-10-CM | POA: Diagnosis not present

## 2016-04-11 DIAGNOSIS — Z23 Encounter for immunization: Secondary | ICD-10-CM

## 2016-04-11 LAB — POCT URINALYSIS DIPSTICK
Bilirubin, UA: NEGATIVE
Glucose, UA: NEGATIVE
KETONES UA: NEGATIVE
Leukocytes, UA: NEGATIVE
Nitrite, UA: NEGATIVE
PROTEIN UA: NEGATIVE
RBC UA: NEGATIVE
Urobilinogen, UA: 0.2
pH, UA: 5

## 2016-04-11 MED ORDER — LISINOPRIL 20 MG PO TABS: 20.0000 mg | ORAL_TABLET | Freq: Every day | ORAL | 12 refills | Status: DC

## 2016-04-11 MED ORDER — FLUOXETINE HCL 20 MG PO CAPS: ORAL_CAPSULE | ORAL | 12 refills | Status: DC

## 2016-04-11 NOTE — Patient Instructions (Addendum)
Health Maintenance  Topic Date Due  . Hepatitis C Screening  [birth year]  . TETANUS/TDAP  09/29/1968  . ZOSTAVAX  09/29/2009  . DEXA SCAN  09/30/2014  . COLONOSCOPY  12/31/2014  . PNA vac Low Risk Adult (2 of 2 - PPSV23) 11/27/2015  . INFLUENZA VACCINE  02/16/2016  . MAMMOGRAM  11/15/2016   Pneumococcal Polysaccharide Vaccine: What You Need to Know 1. Why get vaccinated? Vaccination can protect older adults (and some children and younger adults) from pneumococcal disease. Pneumococcal disease is caused by bacteria that can spread from person to person through close contact. It can cause ear infections, and it can also lead to more serious infections of the:   Lungs (pneumonia),  Blood (bacteremia), and  Covering of the brain and spinal cord (meningitis). Meningitis can cause deafness and brain damage, and it can be fatal. Anyone can get pneumococcal disease, but children under 69 years of age, people with certain medical conditions, adults over 90 years of age, and cigarette smokers are at the highest risk. About 18,000 older adults die each year from pneumococcal disease in the [place]. Treatment of pneumococcal infections with penicillin and other drugs used to be more effective. But some strains of the disease have become resistant to these drugs. This makes prevention of the disease, through vaccination, even more important. 2. Pneumococcal polysaccharide vaccine (PPSV23) Pneumococcal polysaccharide vaccine (PPSV23) protects against 23 types of pneumococcal bacteria. It will not prevent all pneumococcal disease. PPSV23 is recommended for:  All adults 80 years of age and older,  Anyone 2 through 66 years of age with certain long-term health problems,  Anyone 2 through 66 years of age with a weakened immune system,  Adults 87 through 66 years of age who smoke cigarettes or have asthma. Most people need only one dose of PPSV. A second dose is recommended for certain high-risk  groups. People 40 and older should get a dose even if they have gotten one or more doses of the vaccine before they turned 65. Your healthcare provider can give you more information about these recommendations. Most healthy adults develop protection within 2 to 3 weeks of getting the shot. 3. Some people should not get this vaccine  Anyone who has had a life-threatening allergic reaction to PPSV should not get another dose.  Anyone who has a severe allergy to any component of PPSV should not receive it. Tell your provider if you have any severe allergies.  Anyone who is moderately or severely ill when the shot is scheduled may be asked to wait until they recover before getting the vaccine. Someone with a mild illness can usually be vaccinated.  Children less than 64 years of age should not receive this vaccine.  There is no evidence that PPSV is harmful to either a pregnant woman or to her fetus. However, as a precaution, women who need the vaccine should be vaccinated before becoming pregnant, if possible. 4. Risks of a vaccine reaction With any medicine, including vaccines, there is a chance of side effects. These are usually mild and go away on their own, but serious reactions are also possible. About half of people who get PPSV have mild side effects, such as redness or pain where the shot is given, which go away within about two days. Less than 1 out of 100 people develop a fever, muscle aches, or more severe local reactions. Problems that could happen after any vaccine:  People sometimes faint after a medical procedure, including vaccination. Sitting  or lying down for about 15 minutes can help prevent fainting, and injuries caused by a fall. Tell your doctor if you feel dizzy, or have vision changes or ringing in the ears.  Some people get severe pain in the shoulder and have difficulty moving the arm where a shot was given. This happens very rarely.  Any medication can cause a severe  allergic reaction. Such reactions from a vaccine are very rare, estimated at about 1 in a million doses, and would happen within a few minutes to a few hours after the vaccination. As with any medicine, there is a very remote chance of a vaccine causing a serious injury or death. The safety of vaccines is always being monitored. For more information, visit: http://www.aguilar.org/ 5. What if there is a serious reaction? What should I look for? Look for anything that concerns you, such as signs of a severe allergic reaction, very high fever, or unusual behavior.  Signs of a severe allergic reaction can include hives, swelling of the face and throat, difficulty breathing, a fast heartbeat, dizziness, and weakness. These would usually start a few minutes to a few hours after the vaccination. What should I do? If you think it is a severe allergic reaction or other emergency that can't wait, call 9-1-1 or get to the nearest hospital. Otherwise, call your doctor. Afterward, the reaction should be reported to the Vaccine Adverse Event Reporting System (VAERS). Your doctor might file this report, or you can do it yourself through the VAERS web site at www.vaers.SamedayNews.es, or by calling (571)717-2758.  VAERS does not give medical advice. 6. How can I learn more?  Ask your doctor. He or she can give you the vaccine package insert or suggest other sources of information.  Call your local or state health department.  Contact the Centers for Disease Control and Prevention (CDC):  Call 909-013-2630 (1-800-CDC-INFO) or  Visit CDC's website at http://hunter.com/ CDC Pneumococcal Polysaccharide Vaccine VIS (11/08/13)   This information is not intended to replace advice given to you by your health care provider. Make sure you discuss any questions you have with your health care provider.   Document Released: 05/01/2006 Document Revised: 07/25/2014 Document Reviewed: 11/11/2013 Elsevier Interactive  Patient Education [employer].

## 2016-04-11 NOTE — Progress Notes (Signed)
Patient: Deanna Johnson, Female    DOB: 1950/09/24, 65 y.o.   MRN: [id number] Visit Date: 04/11/2016  Today's Provider: Halina Maidens, MD   Chief Complaint  Patient presents with  . Medicare Wellness   Subjective:    Annual wellness visit Deanna Johnson is a 65 y.o. female who presents today for her Subsequent Annual Wellness Visit. She feels fairly well. She reports exercising walking. She reports she is sleeping fairly well.   ----------------------------------------------------------- Hypertension  Pertinent negatives include no chest pain, headaches, palpitations or shortness of breath. Hypertensive end-organ damage includes a thyroid problem.  Thyroid Problem  Presents for follow-up visit. Patient reports no anxiety, constipation, diarrhea, fatigue, palpitations or tremors. The symptoms have been stable. Her past medical history is significant for hyperlipidemia.  Hyperlipidemia  This is a chronic problem. The current episode started more than 1 year ago. Pertinent negatives include no chest pain or shortness of breath. Current antihyperlipidemic treatment includes herbal therapy.    Review of Systems  Constitutional: Negative for chills, fatigue and fever.  HENT: Negative for congestion, hearing loss, tinnitus, trouble swallowing and voice change.   Eyes: Negative for visual disturbance.  Respiratory: Negative for cough, chest tightness, shortness of breath and wheezing.   Cardiovascular: Negative for chest pain, palpitations and leg swelling.  Gastrointestinal: Negative for abdominal pain, constipation, diarrhea and vomiting.  Endocrine: Negative for polydipsia and polyuria.  Genitourinary: Negative for dysuria, frequency, genital sores, vaginal bleeding and vaginal discharge.  Musculoskeletal: Negative for arthralgias, gait problem and joint swelling.  Skin: Negative for color change and rash.  Neurological: Negative for dizziness, tremors, light-headedness and headaches.   Hematological: Negative for adenopathy. Does not bruise/bleed easily.  Psychiatric/Behavioral: Negative for dysphoric mood and sleep disturbance. The patient is not nervous/anxious.     Social History   Social History  . Marital status: Divorced    Spouse name: N/A  . Number of children: N/A  . Years of education: N/A   Occupational History  . Not on file.   Social History Main Topics  . Smoking status: Former Research scientist (life sciences)  . Smokeless tobacco: Never Used  . Alcohol use No  . Drug use: Unknown  . Sexual activity: Not on file   Other Topics Concern  . Not on file   Social History Narrative  . No narrative on file    Patient Active Problem List   Diagnosis Date Noted  . Hypothyroidism due to acquired atrophy of thyroid 04/11/2016  . Recurrent major depression in partial remission ([place]) 04/11/2016  . Hx of colonic polyp 04/11/2016  . Arthritis 12/31/2014  . Essential hypertension 12/31/2014  . Impingement syndrome of left shoulder 12/16/2014  . H/O fibromyalgia 08/18/2012    Past Surgical History:  Procedure Laterality Date  . BREAST BIOPSY Right 1987   neg  . BREAST SURGERY     RIGHT BREAST BIOPSY  . BUNIONECTOMY    . COLONOSCOPY  2011   benign polyps - f/u 2016  . MANDIBLE FRACTURE SURGERY     jaw resection  . shoulder arthroscopy Right   . SHOULDER ARTHROSCOPY WITH OPEN ROTATOR CUFF REPAIR Left 01/30/2015   Procedure: SHOULDER ARTHROSCOPY WITH POSS OPEN ROTATOR CUFF REPAIR;  Surgeon: Leanor Kail, MD;  Location: [facility];  Service: Orthopedics;  Laterality: Left;  Release of long head of biceps tendon Subacromial decompression Mini open rotator cuff repair  . TONSILLECTOMY    . TOTAL ABDOMINAL HYSTERECTOMY      Her family history includes  Breast cancer in her maternal aunt; Diabetes in her maternal aunt.    Previous Medications   CHOLECALCIFEROL 1000 UNITS TABLET    Take 1,000 Units by mouth daily.   FLAXSEED, LINSEED, (FLAXSEED OIL PO)    Take  by mouth.   FLUOXETINE (PROZAC) 20 MG CAPSULE    TAKE (1) CAPSULE BY MOUTH EVERY DAY   FLUTICASONE (FLONASE) 50 MCG/ACT NASAL SPRAY    INSTILL 2 SPRAYS INTO BOTH NOSTRILS ONCEA DAY AS DIRECTED   HYDROCHLOROTHIAZIDE (HYDRODIURIL) 25 MG TABLET    TAKE (1) TABLET BY MOUTH EVERY DAY   LISINOPRIL (PRINIVIL,ZESTRIL) 20 MG TABLET    TAKE (1) TABLET BY MOUTH EVERY DAY   MULTIPLE VITAMINS-MINERALS (CENTRUM SILVER PO)    Take by mouth.   NALTREXONE (DEPADE) 50 MG TABLET    Take 1 tablet by mouth daily.   OMEGA-3 FATTY ACIDS (OMEGA 3 PO)    Take by mouth.   PROBIOTIC PRODUCT (ACIDOPHILUS PROBIOTIC BLEND PO)    Take by mouth.   RED YEAST RICE 600 MG TABS    Take by mouth.   RIFAMPIN (RIFADIN) 300 MG CAPSULE    Take 1 capsule by mouth 2 (two) times daily as needed.    Patient Care Team: Glean Hess, MD as PCP - General (Family Medicine) Reche Dixon, PA-C (Orthopedic Surgery) Anne Hahn, MD as Referring Physician (Family Medicine) Leanor Kail, MD (Orthopedic Surgery)     Objective:   Vitals: BP 118/78   Pulse 84   Resp 16   Ht 5\' 1"  (1.549 m)   Wt 219 lb (99.3 kg)   SpO2 99%   BMI 41.38 kg/m   Physical Exam  Constitutional: She is oriented to person, place, and time. She appears well-developed and well-nourished. No distress.  HENT:  Head: Normocephalic and atraumatic.  Right Ear: Tympanic membrane and ear canal normal.  Left Ear: Tympanic membrane and ear canal normal.  Nose: Right sinus exhibits no maxillary sinus tenderness. Left sinus exhibits no maxillary sinus tenderness.  Mouth/Throat: Uvula is midline and oropharynx is clear and moist.  Eyes: Conjunctivae and EOM are normal. Right eye exhibits no discharge. Left eye exhibits no discharge. No scleral icterus.  Neck: Normal range of motion. Carotid bruit is not present. No erythema present. No thyromegaly present.  Cardiovascular: Normal rate, regular rhythm, normal heart sounds and normal pulses.   No murmur  heard. Pulmonary/Chest: Effort normal. No respiratory distress. She has no wheezes. Right breast exhibits no mass, no nipple discharge, no skin change and no tenderness. Left breast exhibits no mass, no nipple discharge, no skin change and no tenderness.  Abdominal: Soft. Bowel sounds are normal. There is no hepatosplenomegaly. There is no tenderness. There is no CVA tenderness.  Musculoskeletal: Normal range of motion. She exhibits edema. She exhibits no tenderness.  Lymphadenopathy:    She has no cervical adenopathy.    She has no axillary adenopathy.  Neurological: She is alert and oriented to person, place, and time. She has normal reflexes. No cranial nerve deficit or sensory deficit.  Skin: Skin is warm, dry and intact. No rash noted. No erythema.  Psychiatric: She has a normal mood and affect. Her speech is normal and behavior is normal. Thought content normal.  Nursing note and vitals reviewed.   Activities of Daily Living In your present state of health, do you have any difficulty performing the following activities: 04/11/2016  Hearing? N  Vision? N  Difficulty concentrating or making decisions? N  Walking  or climbing stairs? N  Dressing or bathing? N  Doing errands, shopping? N  Preparing Food and eating ? N  Using the Toilet? N  In the past six months, have you accidently leaked urine? Y  Do you have problems with loss of bowel control? N  Managing your Medications? N  Managing your Finances? N  Housekeeping or managing your Housekeeping? N  Some recent data might be hidden    Fall Risk Assessment Fall Risk  04/11/2016  Falls in the past year? No      Depression Screen PHQ 2/9 Scores 04/11/2016  PHQ - 2 Score 2  PHQ- 9 Score 2    Cognitive Testing - 6-CIT   Correct? Score   What year is it? yes 0 Yes = 0    No = 4  What month is it? yes 0 Yes = 0    No = 3  Remember:     Pia [address], [place], [place]     What time is it? yes 0 Yes = 0    No = 3  Count  backwards from 20 to 1 yes 0 Correct = 0    1 error = 2   More than 1 error = 4  Say the months of the year in reverse. yes 0 Correct = 0    1 error = 2   More than 1 error = 4  What address did I ask you to remember? yes 0 Correct = 0  1 error = 2    2 error = 4    3 error = 6    4 error = 8    All wrong = 10       TOTAL SCORE  0/28   Interpretation:  Normal  Normal (0-7) Abnormal (8-28)        Medicare Annual Wellness Visit Summary:  Reviewed patient's Family Medical History Reviewed and updated list of patient's medical providers Assessment of cognitive impairment was done Assessed patient's functional ability Established a written schedule for health screening [place] Completed and Reviewed  Exercise Activities and Dietary recommendations Goals    None      Immunization History  Administered Date(s) Administered  . Pneumococcal Conjugate-13 11/27/2014    Health Maintenance  Topic Date Due  . TETANUS/TDAP  09/29/1968  . ZOSTAVAX  09/29/2009  . DEXA SCAN  09/30/2014  . COLONOSCOPY  12/31/2014  . MAMMOGRAM  11/15/2016  . INFLUENZA VACCINE  Completed  . Hepatitis C Screening  Addressed  . PNA vac Low Risk Adult  Completed     Discussed health benefits of physical activity, and encouraged her to engage in regular exercise appropriate for her age and condition.    ------------------------------------------------------------------------------------------------------------   Assessment & Plan:     1. Medicare annual wellness visit, subsequent Measures satisfied Mammogram up to date - Lipid panel  2. Essential hypertension controlled - CBC with Differential/Platelet - Comprehensive metabolic panel - POCT urinalysis dipstick  3. Hypothyroidism due to acquired atrophy of thyroid On compounded medication - TSH  4. Impingement syndrome of left shoulder Followed by Ortho  5. H/O fibromyalgia Chronic Lyme managed by Dr. Anne Hahn  6. Recurrent major depression in partial remission ([facility]) Stable on medication  7. Need for pneumococcal vaccination - Pneumococcal polysaccharide vaccine 23-valent greater than or equal to 2yo subcutaneous/IM    Halina Maidens, MD [facility]  04/11/2016

## 2016-04-12 LAB — COMPREHENSIVE METABOLIC PANEL
ALBUMIN: 3.9 g/dL (ref 3.6–4.8)
ALT: 15 IU/L (ref 0–32)
AST: 14 IU/L (ref 0–40)
Albumin/Globulin Ratio: 1.6 (ref 1.2–2.2)
Alkaline Phosphatase: 63 IU/L (ref 39–117)
BUN/Creatinine Ratio: 20 (ref 12–28)
BUN: 15 mg/dL (ref 8–27)
Bilirubin Total: 0.6 mg/dL (ref 0.0–1.2)
CO2: 26 mmol/L (ref 18–29)
CREATININE: 0.74 mg/dL (ref 0.57–1.00)
Calcium: 9.5 mg/dL (ref 8.7–10.3)
Chloride: 103 mmol/L (ref 96–106)
GFR calc Af Amer: 98 mL/min/{1.73_m2} (ref >59)
GFR, EST NON AFRICAN AMERICAN: 85 mL/min/{1.73_m2} (ref >59)
GLUCOSE: 93 mg/dL (ref 65–99)
Globulin, Total: 2.4 g/dL (ref 1.5–4.5)
Potassium: 4.8 mmol/L (ref 3.5–5.2)
Sodium: 144 mmol/L (ref 134–144)
TOTAL PROTEIN: 6.3 g/dL (ref 6.0–8.5)

## 2016-04-12 LAB — CBC WITH DIFFERENTIAL/PLATELET
BASOS ABS: 0 10*3/uL (ref 0.0–0.2)
Basos: 0 %
EOS (ABSOLUTE): 0.2 10*3/uL (ref 0.0–0.4)
EOS: 4 %
HEMATOCRIT: 39.6 % (ref 34.0–46.6)
HEMOGLOBIN: 13.2 g/dL (ref 11.1–15.9)
Immature Grans (Abs): 0 10*3/uL (ref 0.0–0.1)
Immature Granulocytes: 0 %
LYMPHS: 30 %
Lymphocytes Absolute: 1.5 10*3/uL (ref 0.7–3.1)
MCH: 28.1 pg (ref 26.6–33.0)
MCHC: 33.3 g/dL (ref 31.5–35.7)
MCV: 84 fL (ref 79–97)
Monocytes Absolute: 0.5 10*3/uL (ref 0.1–0.9)
Monocytes: 10 %
Neutrophils Absolute: 2.8 10*3/uL (ref 1.4–7.0)
Neutrophils: 56 %
Platelets: 274 10*3/uL (ref 150–379)
RBC: 4.69 x10E6/uL (ref 3.77–5.28)
RDW: 14.1 % (ref 12.3–15.4)
WBC: 5 10*3/uL (ref 3.4–10.8)

## 2016-04-12 LAB — LIPID PANEL
CHOL/HDL RATIO: 3.3 ratio (ref 0.0–4.4)
Cholesterol, Total: 222 mg/dL — ABNORMAL HIGH (ref 100–199)
HDL: 68 mg/dL (ref >39)
LDL CALC: 126 mg/dL — AB (ref 0–99)
TRIGLYCERIDES: 139 mg/dL (ref 0–149)
VLDL CHOLESTEROL CAL: 28 mg/dL (ref 5–40)

## 2016-04-12 LAB — TSH

## 2016-04-29 DIAGNOSIS — H40003 Preglaucoma, unspecified, bilateral: Secondary | ICD-10-CM | POA: Diagnosis not present

## 2016-09-19 DIAGNOSIS — M2550 Pain in unspecified joint: Secondary | ICD-10-CM | POA: Diagnosis not present

## 2016-09-19 DIAGNOSIS — G909 Disorder of the autonomic nervous system, unspecified: Secondary | ICD-10-CM | POA: Diagnosis not present

## 2016-09-19 DIAGNOSIS — M549 Dorsalgia, unspecified: Secondary | ICD-10-CM | POA: Diagnosis not present

## 2016-09-19 DIAGNOSIS — R5383 Other fatigue: Secondary | ICD-10-CM | POA: Diagnosis not present

## 2016-09-19 DIAGNOSIS — R5381 Other malaise: Secondary | ICD-10-CM | POA: Diagnosis not present

## 2016-12-26 ENCOUNTER — Telehealth: Payer: Self-pay | Admitting: Internal Medicine

## 2016-12-26 DIAGNOSIS — M2550 Pain in unspecified joint: Secondary | ICD-10-CM | POA: Diagnosis not present

## 2016-12-26 DIAGNOSIS — M549 Dorsalgia, unspecified: Secondary | ICD-10-CM | POA: Diagnosis not present

## 2016-12-26 DIAGNOSIS — G909 Disorder of the autonomic nervous system, unspecified: Secondary | ICD-10-CM | POA: Diagnosis not present

## 2016-12-26 DIAGNOSIS — R5381 Other malaise: Secondary | ICD-10-CM | POA: Diagnosis not present

## 2016-12-26 DIAGNOSIS — R5383 Other fatigue: Secondary | ICD-10-CM | POA: Diagnosis not present

## 2016-12-26 NOTE — Telephone Encounter (Signed)
Pt came in stated she does need a referral for her colonoscopy with [facility]. Pt stated its her 5 year screening colonoscopy. Please advise DS.

## 2017-01-03 ENCOUNTER — Other Ambulatory Visit: Payer: Self-pay | Admitting: Internal Medicine

## 2017-01-03 DIAGNOSIS — Z86010 Personal history of colonic polyps: Secondary | ICD-10-CM

## 2017-01-03 NOTE — Telephone Encounter (Signed)
Done

## 2017-01-03 NOTE — Telephone Encounter (Signed)
Pt informed referral sent

## 2017-02-10 DIAGNOSIS — G909 Disorder of the autonomic nervous system, unspecified: Secondary | ICD-10-CM | POA: Diagnosis not present

## 2017-02-10 DIAGNOSIS — B600 Babesiosis: Secondary | ICD-10-CM | POA: Diagnosis not present

## 2017-02-10 DIAGNOSIS — M2550 Pain in unspecified joint: Secondary | ICD-10-CM | POA: Diagnosis not present

## 2017-02-10 DIAGNOSIS — A6920 Lyme disease, unspecified: Secondary | ICD-10-CM | POA: Diagnosis not present

## 2017-02-10 DIAGNOSIS — M549 Dorsalgia, unspecified: Secondary | ICD-10-CM | POA: Diagnosis not present

## 2017-02-10 DIAGNOSIS — A449 Bartonellosis, unspecified: Secondary | ICD-10-CM | POA: Diagnosis not present

## 2017-02-10 DIAGNOSIS — R5383 Other fatigue: Secondary | ICD-10-CM | POA: Diagnosis not present

## 2017-02-10 DIAGNOSIS — R5381 Other malaise: Secondary | ICD-10-CM | POA: Diagnosis not present

## 2017-02-11 LAB — VITAMIN D 25 HYDROXY (VIT D DEFICIENCY, FRACTURES): VIT D 25 HYDROXY: 35.5

## 2017-02-11 LAB — CBC AND DIFFERENTIAL
HCT: 43 (ref 36–46)
Hemoglobin: 14.5 (ref 12.0–16.0)
Platelets: 300 (ref 150–399)
WBC: 7.2

## 2017-02-11 LAB — BASIC METABOLIC PANEL
BUN: 13 (ref 4–21)
Creatinine: 1 (ref 0.5–1.1)
GLUCOSE: 121
POTASSIUM: 4.2 (ref 3.4–5.3)
Sodium: 140 (ref 137–147)

## 2017-02-11 LAB — TSH: TSH: 12.5 — AB (ref 0.41–5.90)

## 2017-02-11 LAB — HEPATIC FUNCTION PANEL
ALT: 11 (ref 7–35)
AST: 16 (ref 13–35)
Alkaline Phosphatase: 60 (ref 25–125)
BILIRUBIN, TOTAL: 0.4

## 2017-02-13 ENCOUNTER — Telehealth: Payer: Self-pay | Admitting: Internal Medicine

## 2017-02-13 NOTE — Telephone Encounter (Signed)
Called pt to schedule for Annual Wellness Visit with Nurse Health Advisor, Tiffany Hill, my c/b # is [phone]  Kathryn Brown ° °

## 2017-02-22 ENCOUNTER — Other Ambulatory Visit: Payer: Self-pay | Admitting: Internal Medicine

## 2017-02-27 DIAGNOSIS — Z86010 Personal history of colonic polyps: Secondary | ICD-10-CM | POA: Diagnosis not present

## 2017-03-06 ENCOUNTER — Telehealth: Payer: Self-pay | Admitting: Internal Medicine

## 2017-03-06 NOTE — Telephone Encounter (Signed)
Chassidy, will you please ck with Dr. Army Melia to see if she'd like this pt to come in early to do their Labs ahead of their physical? She has her AWV on that Monday 9/24 and the pt was wanting to know if necessary. Please call her to confirm. Thanks, knb

## 2017-03-06 NOTE — Telephone Encounter (Signed)
Deanna Johnson, She will not do labs prior to physicals. She will just do them at her visit. Thanks!

## 2017-03-06 NOTE — Telephone Encounter (Signed)
recvd vm from pt on Friday and have confimed her AWV for 11:30am Monday Sept 24th with [facility]. Followed up with email confirmation also. Pt does not need to fast for this appt. knb

## 2017-03-15 ENCOUNTER — Other Ambulatory Visit: Payer: Self-pay | Admitting: Internal Medicine

## 2017-04-10 ENCOUNTER — Ambulatory Visit (INDEPENDENT_AMBULATORY_CARE_PROVIDER_SITE_OTHER): Payer: Commercial Managed Care - HMO

## 2017-04-10 VITALS — BP 130/76 | HR 74 | Temp 97.4°F | Resp 16 | Ht 61.0 in | Wt 231.2 lb

## 2017-04-10 DIAGNOSIS — Z23 Encounter for immunization: Secondary | ICD-10-CM | POA: Diagnosis not present

## 2017-04-10 DIAGNOSIS — Z0000 Encounter for general adult medical examination without abnormal findings: Secondary | ICD-10-CM | POA: Diagnosis not present

## 2017-04-10 DIAGNOSIS — Z1231 Encounter for screening mammogram for malignant neoplasm of breast: Secondary | ICD-10-CM | POA: Diagnosis not present

## 2017-04-10 DIAGNOSIS — Z13820 Encounter for screening for osteoporosis: Secondary | ICD-10-CM | POA: Diagnosis not present

## 2017-04-10 DIAGNOSIS — G909 Disorder of the autonomic nervous system, unspecified: Secondary | ICD-10-CM | POA: Diagnosis not present

## 2017-04-10 DIAGNOSIS — M549 Dorsalgia, unspecified: Secondary | ICD-10-CM | POA: Diagnosis not present

## 2017-04-10 DIAGNOSIS — R5381 Other malaise: Secondary | ICD-10-CM | POA: Diagnosis not present

## 2017-04-10 DIAGNOSIS — M2550 Pain in unspecified joint: Secondary | ICD-10-CM | POA: Diagnosis not present

## 2017-04-10 DIAGNOSIS — R5383 Other fatigue: Secondary | ICD-10-CM | POA: Diagnosis not present

## 2017-04-10 NOTE — Patient Instructions (Addendum)
Deanna Johnson , Thank you for taking time to come for your Medicare Wellness Visit. I appreciate your ongoing commitment to your health goals. Please review the following plan we discussed and let me know if I can assist you in the future.   Screening recommendations/referrals: Colonoscopy: completed 07/18/2009 Mammogram: due now, Please call [phone] to schedule your mammogram.  Bone Density: due now, Please call [phone] to schedule Recommended yearly ophthalmology/optometry visit for glaucoma screening and checkup Recommended yearly dental visit for hygiene and checkup  Vaccinations: Influenza vaccine: done today  Pneumococcal vaccine: up to date Tdap vaccine: up to date Shingles vaccine: up to date  Advanced directives: Please bring a copy of your health care power of attorney and living will to the office at your convenience.  Conditions/risks identified: none  Next appointment: Follow up on 04/14/2017 at 10:30am with Dr.Berglund. Follow up in one year for your annual wellness exam.    Preventive Care 65 Years and Older, Female Preventive care refers to lifestyle choices and visits with your health care provider that can promote health and wellness. What does preventive care include?  A yearly physical exam. This is also called an annual well check.  Dental exams once or twice a year.  Routine eye exams. Ask your health care provider how often you should have your eyes checked.  Personal lifestyle choices, including:  Daily care of your teeth and gums.  Regular physical activity.  Eating a healthy diet.  Avoiding tobacco and drug use.  Limiting alcohol use.  Practicing safe sex.  Taking low-dose aspirin every day.  Taking vitamin and mineral supplements as recommended by your health care provider. What happens during an annual well check? The services and screenings done by your health care provider during your annual well check will depend on your age,  overall health, lifestyle risk factors, and family history of disease. Counseling  Your health care provider may ask you questions about your:  Alcohol use.  Tobacco use.  Drug use.  Emotional well-being.  Home and relationship well-being.  Sexual activity.  Eating habits.  History of falls.  Memory and ability to understand (cognition).  Work and work Statistician.  Reproductive health. Screening  You may have the following tests or measurements:  Height, weight, and BMI.  Blood pressure.  Lipid and cholesterol levels. These may be checked every 5 years, or more frequently if you are over 62 years old.  Skin check.  Lung cancer screening. You may have this screening every year starting at age 67 if you have a 30-pack-year history of smoking and currently smoke or have quit within the past 15 years.  Fecal occult blood test (FOBT) of the stool. You may have this test every year starting at age 65.  Flexible sigmoidoscopy or colonoscopy. You may have a sigmoidoscopy every 5 years or a colonoscopy every 10 years starting at age 60.  Hepatitis C blood test.  Hepatitis B blood test.  Sexually transmitted disease (STD) testing.  Diabetes screening. This is done by checking your blood sugar (glucose) after you have not eaten for a while (fasting). You may have this done every 1-3 years.  Bone density scan. This is done to screen for osteoporosis. You may have this done starting at age 72.  Mammogram. This may be done every 1-2 years. Talk to your health care provider about how often you should have regular mammograms. Talk with your health care provider about your test results, treatment options, and if necessary, the  need for more tests. Vaccines  Your health care provider may recommend certain vaccines, such as:  Influenza vaccine. This is recommended every year.  Tetanus, diphtheria, and acellular pertussis (Tdap, Td) vaccine. You may need a Td booster every 10  years.  Zoster vaccine. You may need this after age 60.  Pneumococcal 13-valent conjugate (PCV13) vaccine. One dose is recommended after age 61.  Pneumococcal polysaccharide (PPSV23) vaccine. One dose is recommended after age 12. Talk to your health care provider about which screenings and vaccines you need and how often you need them. This information is not intended to replace advice given to you by your health care provider. Make sure you discuss any questions you have with your health care provider. Document Released: 07/31/2015 Document Revised: 03/23/2016 Document Reviewed: 05/05/2015 Elsevier Interactive Patient Education  2017 [place] Prevention in the Home Falls can cause injuries. They can happen to people of all ages. There are many things you can do to make your home safe and to help prevent falls. What can I do on the outside of my home?  Regularly fix the edges of walkways and driveways and fix any cracks.  Remove anything that might make you trip as you walk through a door, such as a raised step or threshold.  Trim any bushes or trees on the path to your home.  Use bright outdoor lighting.  Clear any walking paths of anything that might make someone trip, such as rocks or tools.  Regularly check to see if handrails are loose or broken. Make sure that both sides of any steps have handrails.  Any raised decks and porches should have guardrails on the edges.  Have any leaves, snow, or ice cleared regularly.  Use sand or salt on walking paths during winter.  Clean up any spills in your garage right away. This includes oil or grease spills. What can I do in the bathroom?  Use night lights.  Install grab bars by the toilet and in the tub and shower. Do not use towel bars as grab bars.  Use non-skid mats or decals in the tub or shower.  If you need to sit down in the shower, use a plastic, non-slip stool.  Keep the floor dry. Clean up any water that  spills on the floor as soon as it happens.  Remove soap buildup in the tub or shower regularly.  Attach bath mats securely with double-sided non-slip rug tape.  Do not have throw rugs and other things on the floor that can make you trip. What can I do in the bedroom?  Use night lights.  Make sure that you have a light by your bed that is easy to reach.  Do not use any sheets or blankets that are too big for your bed. They should not hang down onto the floor.  Have a firm chair that has side arms. You can use this for support while you get dressed.  Do not have throw rugs and other things on the floor that can make you trip. What can I do in the kitchen?  Clean up any spills right away.  Avoid walking on wet floors.  Keep items that you use a lot in easy-to-reach places.  If you need to reach something above you, use a strong step stool that has a grab bar.  Keep electrical cords out of the way.  Do not use floor polish or wax that makes floors slippery. If you must use wax,  use non-skid floor wax.  Do not have throw rugs and other things on the floor that can make you trip. What can I do with my stairs?  Do not leave any items on the stairs.  Make sure that there are handrails on both sides of the stairs and use them. Fix handrails that are broken or loose. Make sure that handrails are as long as the stairways.  Check any carpeting to make sure that it is firmly attached to the stairs. Fix any carpet that is loose or worn.  Avoid having throw rugs at the top or bottom of the stairs. If you do have throw rugs, attach them to the floor with carpet tape.  Make sure that you have a light switch at the top of the stairs and the bottom of the stairs. If you do not have them, ask someone to add them for you. What else can I do to help prevent falls?  Wear shoes that:  Do not have high heels.  Have rubber bottoms.  Are comfortable and fit you well.  Are closed at the  toe. Do not wear sandals.  If you use a stepladder:  Make sure that it is fully opened. Do not climb a closed stepladder.  Make sure that both sides of the stepladder are locked into place.  Ask someone to hold it for you, if possible.  Clearly mark and make sure that you can see:  Any grab bars or handrails.  First and last steps.  Where the edge of each step is.  Use tools that help you move around (mobility aids) if they are needed. These include:  Canes.  Walkers.  Scooters.  Crutches.  Turn on the lights when you go into a dark area. Replace any light bulbs as soon as they burn out.  Set up your furniture so you have a clear path. Avoid moving your furniture around.  If any of your floors are uneven, fix them.  If there are any pets around you, be aware of where they are.  Review your medicines with your doctor. Some medicines can make you feel dizzy. This can increase your chance of falling. Ask your doctor what other things that you can do to help prevent falls. This information is not intended to replace advice given to you by your health care provider. Make sure you discuss any questions you have with your health care provider. Document Released: 04/30/2009 Document Revised: 12/10/2015 Document Reviewed: 08/08/2014 Elsevier Interactive Patient Education  2017 [place].  Influenza (Flu) Vaccine (Inactivated or Recombinant): What You Need to Know 1. Why get vaccinated? Influenza ("flu") is a contagious disease that spreads around the [place] every year, usually between October and May. Flu is caused by influenza viruses, and is spread mainly by coughing, sneezing, and close contact. Anyone can get flu. Flu strikes suddenly and can last several days. Symptoms vary by age, but can include:  fever/chills  sore throat  muscle aches  fatigue  cough  headache  runny or stuffy nose  Flu can also lead to pneumonia and blood infections, and cause  diarrhea and seizures in children. If you have a medical condition, such as heart or lung disease, flu can make it worse. Flu is more dangerous for some people. Infants and young children, people 17 years of age and older, pregnant women, and people with certain health conditions or a weakened immune system are at greatest risk. Each year thousands of people in the  [place] die from flu, and many more are hospitalized. Flu vaccine can:  keep you from getting flu,  make flu less severe if you do get it, and  keep you from spreading flu to your family and other people. 2. Inactivated and recombinant flu vaccines A dose of flu vaccine is recommended every flu season. Children 6 months through 92 years of age may need two doses during the same flu season. Everyone else needs only one dose each flu season. Some inactivated flu vaccines contain a very small amount of a mercury-based preservative called thimerosal. Studies have not shown thimerosal in vaccines to be harmful, but flu vaccines that do not contain thimerosal are available. There is no live flu virus in flu shots. They cannot cause the flu. There are many flu viruses, and they are always changing. Each year a new flu vaccine is made to protect against three or four viruses that are likely to cause disease in the upcoming flu season. But even when the vaccine doesn't exactly match these viruses, it may still provide some protection. Flu vaccine cannot prevent:  flu that is caused by a virus not covered by the vaccine, or  illnesses that look like flu but are not.  It takes about 2 weeks for protection to develop after vaccination, and protection lasts through the flu season. 3. Some people should not get this vaccine Tell the person who is giving you the vaccine:  If you have any severe, life-threatening allergies. If you ever had a life-threatening allergic reaction after a dose of flu vaccine, or have a severe allergy to any part  of this vaccine, you may be advised not to get vaccinated. Most, but not all, types of flu vaccine contain a small amount of egg protein.  If you ever had Guillain-Barr Syndrome (also called GBS). Some people with a history of GBS should not get this vaccine. This should be discussed with your doctor.  If you are not feeling well. It is usually okay to get flu vaccine when you have a mild illness, but you might be asked to come back when you feel better.  4. Risks of a vaccine reaction With any medicine, including vaccines, there is a chance of reactions. These are usually mild and go away on their own, but serious reactions are also possible. Most people who get a flu shot do not have any problems with it. Minor problems following a flu shot include:  soreness, redness, or swelling where the shot was given  hoarseness  sore, red or itchy eyes  cough  fever  aches  headache  itching  fatigue  If these problems occur, they usually begin soon after the shot and last 1 or 2 days. More serious problems following a flu shot can include the following:  There may be a small increased risk of Guillain-Barre Syndrome (GBS) after inactivated flu vaccine. This risk has been estimated at 1 or 2 additional cases per million people vaccinated. This is much lower than the risk of severe complications from flu, which can be prevented by flu vaccine.  Young children who get the flu shot along with pneumococcal vaccine (PCV13) and/or DTaP vaccine at the same time might be slightly more likely to have a seizure caused by fever. Ask your doctor for more information. Tell your doctor if a child who is getting flu vaccine has ever had a seizure.  Problems that could happen after any injected vaccine:  People sometimes faint after a  medical procedure, including vaccination. Sitting or lying down for about 15 minutes can help prevent fainting, and injuries caused by a fall. Tell your doctor if you  feel dizzy, or have vision changes or ringing in the ears.  Some people get severe pain in the shoulder and have difficulty moving the arm where a shot was given. This happens very rarely.  Any medication can cause a severe allergic reaction. Such reactions from a vaccine are very rare, estimated at about 1 in a million doses, and would happen within a few minutes to a few hours after the vaccination. As with any medicine, there is a very remote chance of a vaccine causing a serious injury or death. The safety of vaccines is always being monitored. For more information, visit: http://www.aguilar.org/ 5. What if there is a serious reaction? What should I look for? Look for anything that concerns you, such as signs of a severe allergic reaction, very high fever, or unusual behavior. Signs of a severe allergic reaction can include hives, swelling of the face and throat, difficulty breathing, a fast heartbeat, dizziness, and weakness. These would start a few minutes to a few hours after the vaccination. What should I do?  If you think it is a severe allergic reaction or other emergency that can't wait, call 9-1-1 and get the person to the nearest hospital. Otherwise, call your doctor.  Reactions should be reported to the Vaccine Adverse Event Reporting System (VAERS). Your doctor should file this report, or you can do it yourself through the VAERS web site at www.vaers.SamedayNews.es, or by calling (614) 805-1117. ? VAERS does not give medical advice. 6. The National Vaccine Injury Compensation Program The [employer] Vaccine Injury Compensation Program (VICP) is a federal program that was created to compensate people who may have been injured by certain vaccines. Persons who believe they may have been injured by a vaccine can learn about the program and about filing a claim by calling (804) 884-2535 or visiting the [place] website at GoldCloset.com.ee. There is a time limit to file a claim for  compensation. 7. How can I learn more?  Ask your healthcare provider. He or she can give you the vaccine package insert or suggest other sources of information.  Call your local or state health department.  Contact the Centers for Disease Control and Prevention (CDC): ? Call 413-850-7826 (1-800-CDC-INFO) or ? Visit CDC's website at https://gibson.com/ Vaccine Information Statement, Inactivated Influenza Vaccine (02/21/2014) This information is not intended to replace advice given to you by your health care provider. Make sure you discuss any questions you have with your health care provider. Document Released: 04/28/2006 Document Revised: 03/24/2016 Document Reviewed: 03/24/2016 Elsevier Interactive Patient Education  2017 [employer].

## 2017-04-10 NOTE — Progress Notes (Signed)
Subjective:   Deanna Johnson is a 66 y.o. female who presents for Medicare Annual (Subsequent) preventive examination.  Review of Systems:  Cardiac Risk Factors include: advanced age (>31men, >62 women);obesity (BMI >30kg/m2);hypertension     Objective:     Vitals: BP 130/76 (BP Location: Right Arm, Patient Position: Sitting)   Pulse 74   Temp (!) 97.4 F (36.3 C)   Resp 16   Ht 5\' 1"  (1.549 m)   Wt 231 lb 3.2 oz (104.9 kg)   BMI 43.68 kg/m   Body mass index is 43.68 kg/m.   Tobacco History  Smoking Status  . Former Smoker  Smokeless Tobacco  . Never Used    Comment: quit at age 57     Counseling given: Not Answered   Past Medical History:  Diagnosis Date  . Arthritis   . Depression   . Heart murmur    slight no problems  . Hypertension   . Hypothyroidism   . Shoulder pain, left    TORN ROTATOR CUFF   Past Surgical History:  Procedure Laterality Date  . BREAST BIOPSY Right 1987   neg  . BREAST SURGERY     RIGHT BREAST BIOPSY  . BUNIONECTOMY    . COLONOSCOPY  2011   benign polyps - f/u 2016  . MANDIBLE FRACTURE SURGERY     jaw resection  . shoulder arthroscopy Right   . SHOULDER ARTHROSCOPY WITH OPEN ROTATOR CUFF REPAIR Left 01/30/2015   Procedure: SHOULDER ARTHROSCOPY WITH POSS OPEN ROTATOR CUFF REPAIR;  Surgeon: Leanor Kail, MD;  Location: [facility];  Service: Orthopedics;  Laterality: Left;  Release of long head of biceps tendon Subacromial decompression Mini open rotator cuff repair  . TONSILLECTOMY    . TOTAL ABDOMINAL HYSTERECTOMY     Family History  Problem Relation Age of Onset  . Breast cancer Maternal Aunt   . Diabetes Maternal Aunt   . Hypertension Mother   . Bursitis Mother    History  Sexual Activity  . Sexual activity: Not on file    Outpatient Encounter Prescriptions as of 04/10/2017  Medication Sig  . Cholecalciferol 1000 UNITS tablet Take 1,000 Units by mouth daily.  . Flaxseed, Linseed, (FLAXSEED OIL PO)  Take by mouth.  Marland Kitchen FLUoxetine (PROZAC) 20 MG capsule TAKE (1) CAPSULE BY MOUTH EVERY DAY  . fluticasone (FLONASE) 50 MCG/ACT nasal spray INSTILL 2 SPRAYS INTO BOTH NOSTRILS ONCEA DAY AS DIRECTED  . hydrochlorothiazide (HYDRODIURIL) 25 MG tablet TAKE ONE (1) TABLET BY MOUTH ONCE DAILY  . lisinopril (PRINIVIL,ZESTRIL) 20 MG tablet Take 1 tablet (20 mg total) by mouth daily.  . Multiple Vitamins-Minerals (CENTRUM SILVER PO) Take by mouth.  . NONFORMULARY OR COMPOUNDED ITEM   . Probiotic Product (ACIDOPHILUS PROBIOTIC BLEND PO) Take by mouth.  . rifampin (RIFADIN) 300 MG capsule Take 1 capsule by mouth 2 (two) times daily as needed.  . [DISCONTINUED] naltrexone (DEPADE) 50 MG tablet Take 1 tablet by mouth daily.  . [DISCONTINUED] Omega-3 Fatty Acids (OMEGA 3 PO) Take by mouth.  . [DISCONTINUED] Red Yeast Rice 600 MG TABS Take by mouth.   No facility-administered encounter medications on file as of 04/10/2017.     Activities of Daily Living In your present state of health, do you have any difficulty performing the following activities: 04/10/2017 04/11/2016  Hearing? N N  Vision? Y N  Comment occasionally  -  Difficulty concentrating or making decisions? N N  Walking or climbing stairs? N N  Dressing or bathing? N N  Doing errands, shopping? N N  Preparing Food and eating ? N N  Using the Toilet? N N  In the past six months, have you accidently leaked urine? Y Y  Comment wears pads -  Do you have problems with loss of bowel control? N N  Managing your Medications? N N  Managing your Finances? N N  Housekeeping or managing your Housekeeping? N N  Some recent data might be hidden    Patient Care Team: Glean Hess, MD as PCP - General (Family Medicine) Reche Dixon, PA-C (Orthopedic Surgery) Anne Hahn, MD as Referring Physician (Family Medicine) Leanor Kail, MD (Orthopedic Surgery)    Assessment:     Exercise Activities and Dietary recommendations Current Exercise  Habits: Home exercise routine, Type of exercise: yoga, Time (Minutes): 60, Frequency (Times/Week): 3, Weekly Exercise (Minutes/Week): 180, Intensity: Mild, Exercise limited by: None identified  Goals    None     Fall Risk Fall Risk  04/10/2017 04/11/2016  Falls in the past year? No No   Depression Screen PHQ 2/9 Scores 04/10/2017 04/11/2016  PHQ - 2 Score 0 2  PHQ- 9 Score - 2     Cognitive Function     6CIT Screen 04/10/2017 04/11/2016  What Year? 0 points 0 points  What month? 0 points 0 points  What time? 0 points 0 points  Count back from 20 0 points 0 points  Months in reverse 0 points 0 points  Repeat phrase 2 points 0 points  Total Score 2 0    Immunization History  Administered Date(s) Administered  . Influenza, High Dose Seasonal PF 04/10/2017  . Influenza,inj,Quad PF,6+ Mos 04/11/2016  . Influenza-Unspecified 08/01/2011, 04/02/2014  . Pneumococcal Conjugate-13 11/27/2014  . Pneumococcal Polysaccharide-23 04/11/2016  . Tdap 08/14/2013  . Zoster 08/14/2013   Screening Tests Health Maintenance  Topic Date Due  . MAMMOGRAM  05/18/2017 (Originally 11/15/2016)  . DEXA SCAN  05/18/2017 (Originally 09/30/2014)  . COLONOSCOPY  07/22/2017 (Originally 12/31/2014)  . TETANUS/TDAP  08/15/2023  . INFLUENZA VACCINE  Completed  . Hepatitis C Screening  Addressed  . PNA vac Low Risk Adult  Completed      Plan:     I have personally reviewed and addressed the Medicare Annual Wellness questionnaire and have noted the following in the patient's chart:  A. Medical and social history B. Use of alcohol, tobacco or illicit drugs  C. Current medications and supplements D. Functional ability and status E.  Nutritional status F.  Physical activity G. Advance directives H. List of other physicians I.  Hospitalizations, surgeries, and ER visits in previous 12 months J.  [place] such as hearing and vision if needed, cognitive and depression L. Referrals and  appointments   In addition, I have reviewed and discussed with patient certain preventive protocols, quality metrics, and best practice recommendations. A written personalized care plan for preventive services as well as general preventive health recommendations were provided to patient.   Signed,  Tyler Aas, LPN Nurse Health Advisor   MD Recommendations: none

## 2017-04-14 ENCOUNTER — Encounter: Payer: Commercial Managed Care - HMO | Admitting: Internal Medicine

## 2017-04-20 ENCOUNTER — Other Ambulatory Visit: Payer: Self-pay | Admitting: Internal Medicine

## 2017-04-20 DIAGNOSIS — F3341 Major depressive disorder, recurrent, in partial remission: Secondary | ICD-10-CM

## 2017-04-20 DIAGNOSIS — I10 Essential (primary) hypertension: Secondary | ICD-10-CM

## 2017-04-23 ENCOUNTER — Encounter: Payer: Self-pay | Admitting: Internal Medicine

## 2017-04-24 ENCOUNTER — Ambulatory Visit: Payer: Commercial Managed Care - HMO | Admitting: Internal Medicine

## 2017-05-05 ENCOUNTER — Ambulatory Visit (INDEPENDENT_AMBULATORY_CARE_PROVIDER_SITE_OTHER): Payer: Medicare HMO | Admitting: Internal Medicine

## 2017-05-05 ENCOUNTER — Encounter: Payer: Self-pay | Admitting: Internal Medicine

## 2017-05-05 VITALS — BP 124/82 | HR 93 | Ht 61.0 in | Wt 228.0 lb

## 2017-05-05 DIAGNOSIS — I10 Essential (primary) hypertension: Secondary | ICD-10-CM

## 2017-05-05 DIAGNOSIS — E782 Mixed hyperlipidemia: Secondary | ICD-10-CM

## 2017-05-05 DIAGNOSIS — E063 Autoimmune thyroiditis: Secondary | ICD-10-CM | POA: Insufficient documentation

## 2017-05-05 DIAGNOSIS — E038 Other specified hypothyroidism: Secondary | ICD-10-CM | POA: Diagnosis not present

## 2017-05-05 DIAGNOSIS — Z23 Encounter for immunization: Secondary | ICD-10-CM | POA: Diagnosis not present

## 2017-05-05 DIAGNOSIS — F3341 Major depressive disorder, recurrent, in partial remission: Secondary | ICD-10-CM

## 2017-05-05 HISTORY — DX: Mixed hyperlipidemia: E78.2

## 2017-05-05 MED ORDER — FLUOXETINE HCL 20 MG PO CAPS: 20.0000 mg | ORAL_CAPSULE | Freq: Every day | ORAL | 12 refills | Status: DC

## 2017-05-05 MED ORDER — HYDROCHLOROTHIAZIDE 25 MG PO TABS: 25.0000 mg | ORAL_TABLET | Freq: Every day | ORAL | 12 refills | Status: DC

## 2017-05-05 MED ORDER — LISINOPRIL 20 MG PO TABS: 20.0000 mg | ORAL_TABLET | Freq: Every day | ORAL | 12 refills | Status: DC

## 2017-05-05 MED ORDER — ZOSTER VAC RECOMB ADJUVANTED 50 MCG/0.5ML IM SUSR: 0.5000 mL | Freq: Once | INTRAMUSCULAR | 1 refills | Status: AC

## 2017-05-05 NOTE — Progress Notes (Signed)
Date:  05/05/2017   Name:  Deanna Johnson   DOB:  Mar 14, 1951   MRN:  [id number]   Chief Complaint: Hypertension; Hypothyroidism; and Depression Hypertension  This is a chronic problem. The problem is controlled. Associated symptoms include headaches (occasional migraine). Pertinent negatives include no chest pain, palpitations or shortness of breath. Past treatments include diuretics and ACE inhibitors. The current treatment provides moderate improvement.  Depression         This is a chronic problem.  The problem occurs rarely.  Associated symptoms include fatigue and headaches (occasional migraine).  Past treatments include SSRIs - Selective serotonin reuptake inhibitors.  Compliance with treatment is good.  Hypothyroid - recent labs were very elevated.  Has been adjusting her thyroid compounded medication monthly.  She brings her labs for review.   Review of Systems  Constitutional: Positive for fatigue. Negative for chills, fever and unexpected weight change.  HENT: Negative for congestion.   Respiratory: Negative for chest tightness, shortness of breath and wheezing.   Cardiovascular: Positive for leg swelling (mild, intermittent). Negative for chest pain and palpitations.  Genitourinary: Negative for dysuria.  Skin: Negative for rash.  Neurological: Positive for headaches (occasional migraine). Negative for dizziness and tremors.  Psychiatric/Behavioral: Positive for depression. Negative for dysphoric mood and sleep disturbance.    Patient Active Problem List   Diagnosis Date Noted  . Hyperlipidemia, mixed 05/05/2017  . Hypothyroidism due to Hashimoto's thyroiditis 05/05/2017  . Recurrent major depression in partial remission ([place]) 04/11/2016  . Hx of colonic polyp 04/11/2016  . Arthritis 12/31/2014  . Essential hypertension 12/31/2014  . Impingement syndrome of left shoulder 12/16/2014  . H/O fibromyalgia 08/18/2012    Prior to Admission medications   Medication Sig  Start Date End Date Taking? Authorizing Provider  Cholecalciferol 1000 UNITS tablet Take 1,000 Units by mouth daily.   Yes [provider]  Flaxseed, Linseed, (FLAXSEED OIL PO) Take by mouth.   Yes [provider]  FLUoxetine (PROZAC) 20 MG capsule TAKE ONE (1) CAPSULE EACH DAY. 04/20/17  Yes Glean Hess, MD  fluticasone (FLONASE) 50 MCG/ACT nasal spray INSTILL 2 SPRAYS INTO BOTH NOSTRILS ONCEA DAY AS DIRECTED 02/22/17  Yes Glean Hess, MD  hydrochlorothiazide (HYDRODIURIL) 25 MG tablet TAKE ONE (1) TABLET BY MOUTH ONCE DAILY 03/15/17  Yes Glean Hess, MD  lisinopril (PRINIVIL,ZESTRIL) 20 MG tablet TAKE ONE (1) TABLET BY MOUTH ONCE DAILY 04/20/17  Yes Glean Hess, MD  Multiple Vitamins-Minerals (CENTRUM SILVER PO) Take by mouth.   Yes [provider]  NONFORMULARY OR COMPOUNDED ITEM 152 mcg T4/ 36 mcg T3   Yes [provider]  Probiotic Product (ACIDOPHILUS PROBIOTIC BLEND PO) Take by mouth.   Yes [provider]  rifampin (RIFADIN) 300 MG capsule Take 1 capsule by mouth 2 (two) times daily as needed.   Yes [provider]    No Known Allergies  Past Surgical History:  Procedure Laterality Date  . BREAST BIOPSY Right 1987   neg  . BREAST SURGERY     RIGHT BREAST BIOPSY  . BUNIONECTOMY    . COLONOSCOPY  2011   benign polyps - f/u 2016  . MANDIBLE FRACTURE SURGERY     jaw resection  . shoulder arthroscopy Right   . SHOULDER ARTHROSCOPY WITH OPEN ROTATOR CUFF REPAIR Left 01/30/2015   Procedure: SHOULDER ARTHROSCOPY WITH POSS OPEN ROTATOR CUFF REPAIR;  Surgeon: Leanor Kail, MD;  Location: [facility];  Service: Orthopedics;  Laterality:  Left;  Release of long head of biceps tendon Subacromial decompression Mini open rotator cuff repair  . TONSILLECTOMY    . TOTAL ABDOMINAL HYSTERECTOMY      Social History  Substance Use Topics  . Smoking status: Former Research scientist (life sciences)  . Smokeless tobacco: Never Used      Comment: quit at age 64  . Alcohol use No     Medication list has been reviewed and updated.  PHQ 2/9 Scores 05/05/2017 04/10/2017 04/11/2016  PHQ - 2 Score 0 0 2  PHQ- 9 Score 0 - 2    Physical Exam  Constitutional: She is oriented to person, place, and time. She appears well-developed. No distress.  HENT:  Head: Normocephalic and atraumatic.  Neck: Normal range of motion. Neck supple. No thyromegaly present.  Cardiovascular: Normal rate, regular rhythm and normal heart sounds.   Pulmonary/Chest: Effort normal and breath sounds normal. No respiratory distress. She has no wheezes.  Musculoskeletal: Normal range of motion. She exhibits edema. She exhibits no tenderness.  Neurological: She is alert and oriented to person, place, and time.  Skin: Skin is warm and dry. No rash noted.  Psychiatric: She has a normal mood and affect. Her behavior is normal. Thought content normal.  Nursing note and vitals reviewed.   BP 124/82   Pulse 93   Ht 5\' 1"  (1.549 m)   Wt 228 lb (103.4 kg)   SpO2 96%   BMI 43.08 kg/m   Assessment and Plan: 1. Essential hypertension controlled - lisinopril (PRINIVIL,ZESTRIL) 20 MG tablet; Take 1 tablet (20 mg total) by mouth daily.  Dispense: 30 tablet; Refill: 12 - hydrochlorothiazide (HYDRODIURIL) 25 MG tablet; Take 1 tablet (25 mg total) by mouth daily.  Dispense: 30 tablet; Refill: 12  2. Hypothyroidism due to Hashimoto's thyroiditis Followed by Dr. Maris Berger  3. Recurrent major depression in partial remission ([facility]) Doing well - FLUoxetine (PROZAC) 20 MG capsule; Take 1 capsule (20 mg total) by mouth daily.  Dispense: 30 capsule; Refill: 12  4. Hyperlipidemia, mixed Will check labs at next visit fasting  5. Need for shingles vaccine - Zoster Vaccine Adjuvanted [facility]) injection; Inject 0.5 mLs into the muscle once.  Dispense: 0.5 mL; Refill: 1   Meds ordered this encounter  Medications  . lisinopril (PRINIVIL,ZESTRIL) 20 MG tablet    Sig:  Take 1 tablet (20 mg total) by mouth daily.    Dispense:  30 tablet    Refill:  12  . hydrochlorothiazide (HYDRODIURIL) 25 MG tablet    Sig: Take 1 tablet (25 mg total) by mouth daily.    Dispense:  30 tablet    Refill:  12  . FLUoxetine (PROZAC) 20 MG capsule    Sig: Take 1 capsule (20 mg total) by mouth daily.    Dispense:  30 capsule    Refill:  12  . Zoster Vaccine Adjuvanted [facility]) injection    Sig: Inject 0.5 mLs into the muscle once.    Dispense:  0.5 mL    Refill:  1    Partially dictated using Editor, commissioning. Any errors are unintentional.  Halina Maidens, MD [facility]  05/05/2017

## 2017-05-25 ENCOUNTER — Other Ambulatory Visit: Payer: Self-pay

## 2017-05-25 ENCOUNTER — Ambulatory Visit
Admission: RE | Admit: 2017-05-25 | Discharge: 2017-05-25 | Disposition: A | Discharge status: Home / Self Care | Payer: Medicare HMO | Source: Ambulatory Visit | Attending: Internal Medicine | Admitting: Internal Medicine

## 2017-05-25 DIAGNOSIS — Z1231 Encounter for screening mammogram for malignant neoplasm of breast: Secondary | ICD-10-CM

## 2017-05-25 DIAGNOSIS — Z780 Asymptomatic menopausal state: Secondary | ICD-10-CM | POA: Diagnosis not present

## 2017-05-25 DIAGNOSIS — Z13820 Encounter for screening for osteoporosis: Secondary | ICD-10-CM | POA: Diagnosis not present

## 2017-07-20 ENCOUNTER — Encounter: Payer: Self-pay | Admitting: *Deleted

## 2017-07-21 ENCOUNTER — Encounter
Admission: RE | Disposition: A | Discharge status: Home / Self Care | Payer: Self-pay | Source: Ambulatory Visit | Attending: Gastroenterology

## 2017-07-21 ENCOUNTER — Encounter: Payer: Self-pay | Admitting: *Deleted

## 2017-07-21 ENCOUNTER — Ambulatory Visit: Payer: Medicare HMO | Admitting: Anesthesiology

## 2017-07-21 ENCOUNTER — Ambulatory Visit
Admission: RE | Admit: 2017-07-21 | Discharge: 2017-07-21 | Disposition: A | Discharge status: Home / Self Care | Payer: Medicare HMO | Source: Ambulatory Visit | Attending: Gastroenterology | Admitting: Gastroenterology

## 2017-07-21 DIAGNOSIS — Z87891 Personal history of nicotine dependence: Secondary | ICD-10-CM | POA: Diagnosis not present

## 2017-07-21 DIAGNOSIS — F329 Major depressive disorder, single episode, unspecified: Secondary | ICD-10-CM | POA: Insufficient documentation

## 2017-07-21 DIAGNOSIS — K5730 Diverticulosis of large intestine without perforation or abscess without bleeding: Secondary | ICD-10-CM | POA: Insufficient documentation

## 2017-07-21 DIAGNOSIS — R011 Cardiac murmur, unspecified: Secondary | ICD-10-CM | POA: Insufficient documentation

## 2017-07-21 DIAGNOSIS — E039 Hypothyroidism, unspecified: Secondary | ICD-10-CM | POA: Insufficient documentation

## 2017-07-21 DIAGNOSIS — K635 Polyp of colon: Secondary | ICD-10-CM | POA: Diagnosis not present

## 2017-07-21 DIAGNOSIS — M1990 Unspecified osteoarthritis, unspecified site: Secondary | ICD-10-CM | POA: Diagnosis not present

## 2017-07-21 DIAGNOSIS — D120 Benign neoplasm of cecum: Secondary | ICD-10-CM | POA: Diagnosis not present

## 2017-07-21 DIAGNOSIS — I10 Essential (primary) hypertension: Secondary | ICD-10-CM | POA: Insufficient documentation

## 2017-07-21 DIAGNOSIS — Z86010 Personal history of colonic polyps: Secondary | ICD-10-CM | POA: Insufficient documentation

## 2017-07-21 DIAGNOSIS — M797 Fibromyalgia: Secondary | ICD-10-CM | POA: Diagnosis not present

## 2017-07-21 DIAGNOSIS — Z79899 Other long term (current) drug therapy: Secondary | ICD-10-CM | POA: Insufficient documentation

## 2017-07-21 DIAGNOSIS — Z1211 Encounter for screening for malignant neoplasm of colon: Secondary | ICD-10-CM | POA: Insufficient documentation

## 2017-07-21 HISTORY — DX: Personal history of other infectious and parasitic diseases: Z86.19

## 2017-07-21 HISTORY — DX: Complete rotator cuff tear or rupture of unspecified shoulder, not specified as traumatic: M75.120

## 2017-07-21 HISTORY — PX: COLONOSCOPY WITH PROPOFOL: SHX5780

## 2017-07-21 HISTORY — DX: Fibromyalgia: M79.7

## 2017-07-21 SURGERY — COLONOSCOPY WITH PROPOFOL
Anesthesia: General

## 2017-07-21 MED ORDER — SODIUM CHLORIDE 0.9 % IV SOLN
INTRAVENOUS | Status: DC
  Administered 2017-07-21 (×2): via INTRAVENOUS

## 2017-07-21 MED ORDER — PROPOFOL 500 MG/50ML IV EMUL
INTRAVENOUS | Status: AC
  Filled 2017-07-21: qty 50

## 2017-07-21 MED ORDER — PROPOFOL 500 MG/50ML IV EMUL
INTRAVENOUS | Status: DC | PRN
  Administered 2017-07-21: 125 ug/kg/min via INTRAVENOUS

## 2017-07-21 MED ORDER — PROPOFOL 10 MG/ML IV BOLUS
INTRAVENOUS | Status: DC | PRN
  Administered 2017-07-21: 50 mg via INTRAVENOUS

## 2017-07-21 MED ORDER — SODIUM CHLORIDE 0.9 % IV SOLN: INTRAVENOUS | Status: DC

## 2017-07-21 NOTE — Anesthesia Post-op Follow-up Note (Signed)
Anesthesia QCDR form completed.        

## 2017-07-21 NOTE — Anesthesia Preprocedure Evaluation (Signed)
Anesthesia Evaluation  Patient identified by MRN, date of birth, ID band Patient awake    Reviewed: Allergy & Precautions, NPO status , Patient's Chart, lab work & pertinent test results  History of Anesthesia Complications Negative for: history of anesthetic complications  Airway Mallampati: III       Dental   Pulmonary neg sleep apnea, neg COPD, former smoker,           Cardiovascular hypertension, Pt. on medications (-) Past MI and (-) CHF (-) dysrhythmias + Valvular Problems/Murmurs (murmur, no tx)      Neuro/Psych neg Seizures Depression    GI/Hepatic Neg liver ROS, neg GERD  ,  Endo/Other  neg diabetesHypothyroidism Morbid obesity  Renal/GU negative Renal ROS     Musculoskeletal  (+) Arthritis , Fibromyalgia -  Abdominal   Peds  Hematology   Anesthesia Other Findings   Reproductive/Obstetrics                            Anesthesia Physical Anesthesia Plan  ASA: III  Anesthesia Plan: General   Post-op Pain Management:    Induction: Intravenous  PONV Risk Score and Plan: 3 and TIVA, Propofol infusion and Ondansetron  Airway Management Planned: Nasal Cannula  Additional Equipment:   Intra-op Plan:   Post-operative Plan:   Informed Consent: I have reviewed the patients History and Physical, chart, labs and discussed the procedure including the risks, benefits and alternatives for the proposed anesthesia with the patient or authorized representative who has indicated his/her understanding and acceptance.     Plan Discussed with:   Anesthesia Plan Comments:         Anesthesia Quick Evaluation

## 2017-07-21 NOTE — Anesthesia Postprocedure Evaluation (Signed)
Anesthesia Post Note  Patient: Deanna Johnson  Procedure(s) Performed: COLONOSCOPY WITH PROPOFOL (N/A )  Patient location during evaluation: Endoscopy Anesthesia Type: General Level of consciousness: awake and alert Pain management: pain level controlled Vital Signs Assessment: post-procedure vital signs reviewed and stable Respiratory status: spontaneous breathing, nonlabored ventilation, respiratory function stable and patient connected to nasal cannula oxygen Cardiovascular status: blood pressure returned to baseline and stable Postop Assessment: no apparent nausea or vomiting Anesthetic complications: no     Last Vitals:  Vitals:   07/21/17 1353 07/21/17 1403  BP: (!) 152/75 122/70  Pulse: 86 86  Resp: 20 17  Temp:    SpO2: 97% 97%    Last Pain:  Vitals:   07/21/17 1343  TempSrc: Tympanic                 Martha Clan

## 2017-07-21 NOTE — Transfer of Care (Signed)
Immediate Anesthesia Transfer of Care Note  Patient: Deanna Johnson  Procedure(s) Performed: COLONOSCOPY WITH PROPOFOL (N/A )  Patient Location: PACU  Anesthesia Type:General  Level of Consciousness: awake, alert  and oriented  Airway & Oxygen Therapy: Patient Spontanous Breathing and Patient connected to nasal cannula oxygen  Post-op Assessment: Report given to RN and Post -op Vital signs reviewed and stable  Post vital signs: Reviewed and stable  Last Vitals:  Vitals:   07/21/17 1221  BP: (!) 151/80  Pulse: 97  Resp: 18  Temp: 37.1 C  SpO2: 98%    Last Pain:  Vitals:   07/21/17 1221  TempSrc: Tympanic         Complications: No apparent anesthesia complications

## 2017-07-21 NOTE — Op Note (Signed)
[facility] Gastroenterology Patient Name: Deanna Johnson Procedure Date: 07/21/2017 1:10 PM MRN: [id number] Account #: [id number] Date of Birth: 09/11/1950 Admit Type: Outpatient Age: 67 Room: [facility] ENDO ROOM 3 Gender: Female Note Status: Finalized Procedure:            Colonoscopy Indications:          Personal history of colonic polyps Providers:            Lollie Sails, MD Referring MD:         Halina Maidens, MD (Referring MD) Medicines:            Monitored Anesthesia Care Complications:        No immediate complications. Procedure:            Pre-Anesthesia Assessment:                       - ASA Grade Assessment: III - A patient with severe                        systemic disease.                       After obtaining informed consent, the colonoscope was                        passed under direct vision. Throughout the procedure,                        the patient's blood pressure, pulse, and oxygen                        saturations were monitored continuously. The Olympus                        PCF-H180AL colonoscope ( S#: [id number] ) was introduced                        through the anus and advanced to the the cecum,                        identified by appendiceal orifice and ileocecal valve.                        The colonoscopy was performed without difficulty. The                        patient tolerated the procedure well. The quality of                        the bowel preparation was good. Findings:      Three sessile polyps were found in the cecum. The polyps were 1 to 2 mm       in size. These polyps were removed with a cold biopsy forceps. Resection       and retrieval were complete.      Multiple small-mouthed diverticula were found in the sigmoid colon and       distal descending colon.      The digital rectal exam was normal.      The exam was otherwise without abnormality. Impression:           -  Three 1 to 2 mm polyps in the cecum,  removed with a                        cold biopsy forceps. Resected and retrieved.                       - Diverticulosis in the sigmoid colon and in the distal                        descending colon.                       - The examination was otherwise normal. Recommendation:       - Discharge patient to home.                       - Telephone GI clinic for pathology results in 1 week.                       - Await pathology results. Procedure Code(s):    --- Professional ---                       [phone], Colonoscopy, flexible; with biopsy, single or                        multiple Diagnosis Code(s):    --- Professional ---                       D12.0, Benign neoplasm of cecum                       Z86.010, Personal history of colonic polyps                       K57.30, Diverticulosis of large intestine without                        perforation or abscess without bleeding CPT copyright 2016 American Medical Association. All rights reserved. The codes documented in this report are preliminary and upon coder review may  be revised to meet current compliance requirements. Lollie Sails, MD 07/21/2017 1:41:13 PM This report has been signed electronically. Number of Addenda: 0 Note Initiated On: 07/21/2017 1:10 PM Scope Withdrawal Time: 0 hours 12 minutes 19 seconds  Total Procedure Duration: 0 hours 22 minutes 17 seconds       [facility]

## 2017-07-21 NOTE — H&P (Signed)
Outpatient short stay form Pre-procedure 07/21/2017 1:09 PM Deanna Sails MD  Primary Physician: Dr. Halina Maidens  Reason for visit:  Colonoscopy  History of present illness:  Patient is a 67 year old female presenting today as above. She has personal history of adenomatous colon polyps with her last colonoscopy being about 6-7 years ago. She tolerated her prep well. She takes no aspirin or blood thinning agent.     Current Facility-Administered Medications:  .  0.9 %  sodium chloride infusion, , Intravenous, Continuous, Deanna Sails, MD, Last Rate: 20 mL/hr at 07/21/17 1251 .  0.9 %  sodium chloride infusion, , Intravenous, Continuous, Deanna Sails, MD  Medications Prior to Admission  Medication Sig Dispense Refill Last Dose  . Cholecalciferol 1000 UNITS tablet Take 1,000 Units by mouth daily.   Past Week at Unknown time  . Flaxseed, Linseed, (FLAXSEED OIL PO) Take by mouth.   Past Week at Unknown time  . FLUoxetine (PROZAC) 20 MG capsule Take 1 capsule (20 mg total) by mouth daily. 30 capsule 12 07/20/2017 at Unknown time  . fluticasone (FLONASE) 50 MCG/ACT nasal spray INSTILL 2 SPRAYS INTO BOTH NOSTRILS ONCEA DAY AS DIRECTED 16 g 5 Past Week at Unknown time  . hydrochlorothiazide (HYDRODIURIL) 25 MG tablet Take 1 tablet (25 mg total) by mouth daily. 30 tablet 12 07/20/2017 at Unknown time  . lisinopril (PRINIVIL,ZESTRIL) 20 MG tablet Take 1 tablet (20 mg total) by mouth daily. 30 tablet 12 07/21/2017 at Unknown time  . Multiple Vitamins-Minerals (CENTRUM SILVER PO) Take by mouth.   Past Week at Unknown time  . naproxen sodium (ALEVE) 220 MG tablet Take 220 mg by mouth.   Past Week at Unknown time  . NONFORMULARY OR COMPOUNDED ITEM 152 mcg T4/ 36 mcg T3   07/20/2017 at Unknown time  . polyethylene glycol powder (GLYCOLAX/MIRALAX) powder Take 1 Container by mouth once.     . tinidazole (TINDAMAX) 250 MG tablet Take by mouth daily with breakfast.     . Probiotic Product  (ACIDOPHILUS PROBIOTIC BLEND PO) Take by mouth.   Taking  . rifampin (RIFADIN) 300 MG capsule Take 1 capsule by mouth 2 (two) times daily as needed.   Not Taking at Unknown time     No Known Allergies   Past Medical History:  Diagnosis Date  . Arthritis   . Complete tear of rotator cuff    RIGHT  . Depression   . Fibromyalgia   . Heart murmur    slight no problems  . History of tick-borne disease   . Hypertension   . Hypothyroidism   . Shoulder pain, left    TORN ROTATOR CUFF    Review of systems:      Physical Exam    Heart and lungs: Regular rate and rhythm without rub or gallop, lungs are bilaterally clear.    HEENT: Normocephalic atraumatic eyes are anicteric    Other:     Pertinant exam for procedure: Soft nontender nondistended bowel sounds positive normoactive.    Planned proceedures: Colonoscopy and indicated procedures. I have discussed the risks benefits and complications of procedures to include not limited to bleeding, infection, perforation and the risk of sedation and the patient wishes to proceed.    Deanna Sails, MD Gastroenterology 07/21/2017  1:09 PM

## 2017-07-24 ENCOUNTER — Encounter: Payer: Self-pay | Admitting: Gastroenterology

## 2017-07-24 LAB — SURGICAL PATHOLOGY

## 2017-08-24 DIAGNOSIS — M2550 Pain in unspecified joint: Secondary | ICD-10-CM | POA: Diagnosis not present

## 2017-08-24 DIAGNOSIS — M549 Dorsalgia, unspecified: Secondary | ICD-10-CM | POA: Diagnosis not present

## 2017-08-24 DIAGNOSIS — A449 Bartonellosis, unspecified: Secondary | ICD-10-CM | POA: Diagnosis not present

## 2017-08-24 DIAGNOSIS — R5381 Other malaise: Secondary | ICD-10-CM | POA: Diagnosis not present

## 2017-08-24 DIAGNOSIS — A6920 Lyme disease, unspecified: Secondary | ICD-10-CM | POA: Diagnosis not present

## 2017-08-24 DIAGNOSIS — B600 Babesiosis: Secondary | ICD-10-CM | POA: Diagnosis not present

## 2017-08-24 DIAGNOSIS — R5383 Other fatigue: Secondary | ICD-10-CM | POA: Diagnosis not present

## 2017-08-24 DIAGNOSIS — G909 Disorder of the autonomic nervous system, unspecified: Secondary | ICD-10-CM | POA: Diagnosis not present

## 2017-08-24 LAB — BASIC METABOLIC PANEL
BUN: 16 (ref 4–21)
Creatinine: 1 (ref 0.5–1.1)

## 2017-08-24 LAB — TSH: TSH: 0.24 — AB (ref 0.41–5.90)

## 2017-08-28 ENCOUNTER — Encounter: Payer: Self-pay | Admitting: Internal Medicine

## 2017-10-23 ENCOUNTER — Ambulatory Visit (INDEPENDENT_AMBULATORY_CARE_PROVIDER_SITE_OTHER): Payer: Medicare HMO | Admitting: Internal Medicine

## 2017-10-23 ENCOUNTER — Encounter: Payer: Self-pay | Admitting: Internal Medicine

## 2017-10-23 VITALS — BP 134/84 | HR 91 | Resp 16 | Ht 61.0 in | Wt 235.0 lb

## 2017-10-23 DIAGNOSIS — I10 Essential (primary) hypertension: Secondary | ICD-10-CM

## 2017-10-23 DIAGNOSIS — Z0000 Encounter for general adult medical examination without abnormal findings: Secondary | ICD-10-CM

## 2017-10-23 DIAGNOSIS — E063 Autoimmune thyroiditis: Secondary | ICD-10-CM

## 2017-10-23 DIAGNOSIS — E782 Mixed hyperlipidemia: Secondary | ICD-10-CM

## 2017-10-23 DIAGNOSIS — Z1231 Encounter for screening mammogram for malignant neoplasm of breast: Secondary | ICD-10-CM | POA: Diagnosis not present

## 2017-10-23 DIAGNOSIS — E038 Other specified hypothyroidism: Secondary | ICD-10-CM

## 2017-10-23 DIAGNOSIS — F3341 Major depressive disorder, recurrent, in partial remission: Secondary | ICD-10-CM

## 2017-10-23 LAB — POCT URINALYSIS DIPSTICK
Bilirubin, UA: NEGATIVE
Blood, UA: NEGATIVE
Glucose, UA: NEGATIVE
Ketones, UA: NEGATIVE
LEUKOCYTES UA: NEGATIVE
Nitrite, UA: NEGATIVE
PH UA: 5 (ref 5.0–8.0)
PROTEIN UA: NEGATIVE
SPEC GRAV UA: 1.02 (ref 1.010–1.025)
UROBILINOGEN UA: 0.2 U/dL

## 2017-10-23 NOTE — Patient Instructions (Signed)

## 2017-10-23 NOTE — Progress Notes (Signed)
Date:  10/23/2017   Name:  Deanna Johnson   DOB:  1951-01-30   MRN:  [id number]   Chief Complaint: Annual Exam (Breast Exam. ) and Toe Injury (L foot 3 rd and 4 th digit toes bruised and feel broken after near falling hanging up pictures.) Deanna Johnson is a 67 y.o. female who presents today for her Complete Annual Exam. She feels fairly well. She reports exercising none. She reports she is sleeping fairly well. She had MAW in September.   She denies breast complaints.  Mammogram was done in November.  Hypertension  This is a chronic problem. The problem is controlled. Pertinent negatives include no chest pain, headaches, palpitations or shortness of breath. Past treatments include ACE inhibitors and diuretics. The current treatment provides significant improvement. Identifiable causes of hypertension include a thyroid problem.  Hyperlipidemia  This is a chronic problem. Pertinent negatives include no chest pain or shortness of breath. Current antihyperlipidemic treatment includes diet change.  Depression         This is a chronic problem.  The problem has been resolved since onset.  Associated symptoms include no fatigue and no headaches.  Past treatments include SSRIs - Selective serotonin reuptake inhibitors.  Previous treatment provided significant relief.  Past medical history includes thyroid problem.   Thyroid Problem  Presents for follow-up visit. Patient reports no anxiety, constipation, diarrhea, fatigue, palpitations or tremors. The symptoms have been stable. Her past medical history is significant for hyperlipidemia.  Toe pain - injured toe three days ago.  Stepped off a stool and hyperflexed her left toes.  Lab Results  Component Value Date   CREATININE 1.0 08/24/2017   BUN 16 08/24/2017   NA 140 02/11/2017   K 4.2 02/11/2017   CL 103 04/11/2016   CO2 26 04/11/2016   Lab Results  Component Value Date   TSH 0.24 (A) 08/24/2017     Review of Systems  Constitutional:  Negative for chills, fatigue and fever.  HENT: Negative for congestion, hearing loss, tinnitus, trouble swallowing and voice change.   Eyes: Negative for visual disturbance.  Respiratory: Negative for cough, chest tightness, shortness of breath and wheezing.   Cardiovascular: Negative for chest pain, palpitations and leg swelling.  Gastrointestinal: Negative for abdominal pain, constipation, diarrhea and vomiting.  Endocrine: Negative for polydipsia and polyuria.  Genitourinary: Negative for dysuria, frequency, genital sores, vaginal bleeding and vaginal discharge.  Musculoskeletal: Positive for arthralgias (left toes). Negative for gait problem and joint swelling.  Skin: Negative for color change and rash.  Neurological: Negative for dizziness, tremors, light-headedness and headaches.  Hematological: Negative for adenopathy. Does not bruise/bleed easily.  Psychiatric/Behavioral: Positive for depression. Negative for dysphoric mood and sleep disturbance. The patient is not nervous/anxious.     Patient Active Problem List   Diagnosis Date Noted  . Hyperlipidemia, mixed 05/05/2017  . Hypothyroidism due to Hashimoto's thyroiditis 05/05/2017  . Recurrent major depression in partial remission ([place]) 04/11/2016  . Hx of colonic polyp 04/11/2016  . Arthritis 12/31/2014  . Essential hypertension 12/31/2014  . Impingement syndrome of left shoulder 12/16/2014  . H/O fibromyalgia 08/18/2012    Prior to Admission medications   Medication Sig Start Date End Date Taking? Authorizing Provider  Cholecalciferol 1000 UNITS tablet Take 1,000 Units by mouth daily.    [provider]  Flaxseed, Linseed, (FLAXSEED OIL PO) Take by mouth.    [provider]  FLUoxetine (PROZAC) 20 MG capsule Take 1 capsule (20 mg total)  by mouth daily. 05/05/17   Glean Hess, MD  fluticasone [facility]) 50 MCG/ACT nasal spray INSTILL 2 SPRAYS INTO BOTH NOSTRILS ONCEA DAY AS DIRECTED 02/22/17   Glean Hess, MD  hydrochlorothiazide (HYDRODIURIL) 25 MG tablet Take 1 tablet (25 mg total) by mouth daily. 05/05/17   Glean Hess, MD  lisinopril (PRINIVIL,ZESTRIL) 20 MG tablet Take 1 tablet (20 mg total) by mouth daily. 05/05/17   Glean Hess, MD  Multiple Vitamins-Minerals (CENTRUM SILVER PO) Take by mouth.    [provider]  naproxen sodium (ALEVE) 220 MG tablet Take 220 mg by mouth.    [provider]  NONFORMULARY OR COMPOUNDED ITEM 152 mcg T4/ 36 mcg T3    [provider]  polyethylene glycol powder (GLYCOLAX/MIRALAX) powder Take 1 Container by mouth once.    [provider]  Probiotic Product (ACIDOPHILUS PROBIOTIC BLEND PO) Take by mouth.    [provider]  rifampin (RIFADIN) 300 MG capsule Take 1 capsule by mouth 2 (two) times daily as needed.    [provider]  tinidazole (TINDAMAX) 250 MG tablet Take by mouth daily with breakfast.    [provider]    No Known Allergies  Past Surgical History:  Procedure Laterality Date  . BREAST BIOPSY Right 1987   neg  . BREAST SURGERY     RIGHT BREAST BIOPSY  . BUNIONECTOMY    . COLONOSCOPY  2011   benign polyps - f/u 2016  . COLONOSCOPY WITH PROPOFOL N/A 07/21/2017   Procedure: COLONOSCOPY WITH PROPOFOL;  Surgeon: Lollie Sails, MD;  Location: [facility] ENDOSCOPY;  Service: Endoscopy;  Laterality: N/A;  . FRACTURE SURGERY     MAXILLARY FACIAL SURGERY  . MANDIBLE FRACTURE SURGERY     jaw resection  . shoulder arthroscopy Right   . SHOULDER ARTHROSCOPY WITH OPEN ROTATOR CUFF REPAIR Left 01/30/2015   Procedure: SHOULDER ARTHROSCOPY WITH POSS OPEN ROTATOR CUFF REPAIR;  Surgeon: Leanor Kail, MD;  Location: [place];  Service: Orthopedics;  Laterality: Left;  Release of long head of biceps tendon Subacromial decompression Mini open rotator cuff repair  . TONSILLECTOMY    . TOTAL ABDOMINAL HYSTERECTOMY      Social History   Tobacco Use  . Smoking  status: Former Research scientist (life sciences)  . Smokeless tobacco: Never Used  . Tobacco comment: quit at age 15  Substance Use Topics  . Alcohol use: No    Alcohol/week: 0.0 oz  . Drug use: No     Medication list has been reviewed and updated.  PHQ 2/9 Scores 10/23/2017 05/05/2017 04/10/2017 04/11/2016  PHQ - 2 Score 0 0 0 2  PHQ- 9 Score 0 0 - 2    Physical Exam  Constitutional: She is oriented to person, place, and time. She appears well-developed and well-nourished. No distress.  HENT:  Head: Normocephalic and atraumatic.  Right Ear: Tympanic membrane and ear canal normal.  Left Ear: Tympanic membrane and ear canal normal.  Nose: Right sinus exhibits no maxillary sinus tenderness. Left sinus exhibits no maxillary sinus tenderness.  Mouth/Throat: Uvula is midline and oropharynx is clear and moist.  Eyes: Conjunctivae and EOM are normal. Right eye exhibits no discharge. Left eye exhibits no discharge. No scleral icterus.  Neck: Normal range of motion. Carotid bruit is not present. No erythema present. No thyromegaly present.  Cardiovascular: Normal rate, regular rhythm, normal heart sounds and normal pulses.  Pulmonary/Chest: Effort normal. No respiratory distress. She has no wheezes. Right breast exhibits  no mass, no nipple discharge, no skin change and no tenderness. Left breast exhibits no mass, no nipple discharge, no skin change and no tenderness.  Abdominal: Soft. Bowel sounds are normal. There is no hepatosplenomegaly. There is no tenderness. There is no CVA tenderness.  Musculoskeletal: She exhibits tenderness.       Feet:  Lymphadenopathy:    She has no cervical adenopathy.    She has no axillary adenopathy.  Neurological: She is alert and oriented to person, place, and time. She has normal reflexes. No cranial nerve deficit or sensory deficit.  Skin: Skin is warm, dry and intact. No rash noted.  Psychiatric: She has a normal mood and affect. Her speech is normal and behavior is normal. Thought  content normal.  Nursing note and vitals reviewed.   BP 134/84 (BP Location: Left Arm, Patient Position: Sitting, Cuff Size: Large)   Pulse 91   Resp 16   Ht 5\' 1"  (1.549 m)   Wt 235 lb (106.6 kg)   SpO2 98%   BMI 44.40 kg/m   Assessment and Plan: 1. Annual physical exam Encourage low calorie diet and weight loss Schedule mammogram in November  2. Essential hypertension controlled  3. Hypothyroidism due to Hashimoto's thyroiditis Pt will have compounding pharmacy send me the Rx to sign Continue compounded thyroid T4/T3  4. Hyperlipidemia, mixed Check labs  5. Recurrent major depression in partial remission ([place]) Doing well on Prozac   No orders of the defined types were placed in this encounter.   Partially dictated using Editor, commissioning. Any errors are unintentional.  Halina Maidens, MD [facility]  10/23/2017

## 2017-10-24 LAB — LIPID PANEL
CHOL/HDL RATIO: 4 ratio (ref 0.0–4.4)
Cholesterol, Total: 279 mg/dL — ABNORMAL HIGH (ref 100–199)
HDL: 70 mg/dL (ref >39)
LDL CALC: 172 mg/dL — AB (ref 0–99)
Triglycerides: 186 mg/dL — ABNORMAL HIGH (ref 0–149)
VLDL CHOLESTEROL CAL: 37 mg/dL (ref 5–40)

## 2017-11-08 ENCOUNTER — Encounter: Payer: Self-pay | Admitting: Internal Medicine

## 2017-11-08 ENCOUNTER — Ambulatory Visit (INDEPENDENT_AMBULATORY_CARE_PROVIDER_SITE_OTHER): Payer: Medicare HMO | Admitting: Internal Medicine

## 2017-11-08 VITALS — BP 144/84 | HR 99 | Ht 61.0 in | Wt 234.0 lb

## 2017-11-08 DIAGNOSIS — E038 Other specified hypothyroidism: Secondary | ICD-10-CM | POA: Diagnosis not present

## 2017-11-08 DIAGNOSIS — E063 Autoimmune thyroiditis: Secondary | ICD-10-CM

## 2017-11-08 NOTE — Progress Notes (Signed)
Date:  11/08/2017   Name:  Deanna Johnson   DOB:  13-Dec-1950   MRN:  [id number]   Chief Complaint: Hypothyroidism (Wants to discuss labs from previous physician who was monitoring her thyroid. States she is extremely tired and states she " can't take it anymore. Weight gain. Sleeping 12 hours a day. )  HPI Hypothyroidism - has been on compounded T4-T3 for some time.  Previous MD no longer working so needs new Rx.  She has recent labs to review. Her current dose is at 4 grains of armour thyroid equivalent divided bid.She feels that her dose needs to be increased slightly.  Review of Systems  Constitutional: Positive for fatigue and unexpected weight change.  Musculoskeletal: Positive for myalgias.  Psychiatric/Behavioral: Positive for sleep disturbance (excessive sleep).    Patient Active Problem List   Diagnosis Date Noted  . Hyperlipidemia, mixed 05/05/2017  . Hypothyroidism due to Hashimoto's thyroiditis 05/05/2017  . Recurrent major depression in partial remission ([place]) 04/11/2016  . Hx of colonic polyp 04/11/2016  . Arthritis 12/31/2014  . Essential hypertension 12/31/2014  . Impingement syndrome of left shoulder 12/16/2014  . H/O fibromyalgia 08/18/2012    Prior to Admission medications   Medication Sig Start Date End Date Taking? Authorizing Provider  Cholecalciferol 1000 UNITS tablet Take 1,000 Units by mouth daily.    [provider]  Flaxseed, Linseed, (FLAXSEED OIL PO) Take by mouth.    [provider]  FLUoxetine (PROZAC) 20 MG capsule Take 1 capsule (20 mg total) by mouth daily. 05/05/17   Glean Hess, MD  fluticasone [facility]) 50 MCG/ACT nasal spray INSTILL 2 SPRAYS INTO BOTH NOSTRILS ONCEA DAY AS DIRECTED 02/22/17   Glean Hess, MD  hydrochlorothiazide (HYDRODIURIL) 25 MG tablet Take 1 tablet (25 mg total) by mouth daily. 05/05/17   Glean Hess, MD  lisinopril (PRINIVIL,ZESTRIL) 20 MG tablet Take 1 tablet (20 mg total) by mouth  daily. 05/05/17   Glean Hess, MD  Multiple Vitamins-Minerals (CENTRUM SILVER PO) Take by mouth.    [provider]  naproxen sodium (ALEVE) 220 MG tablet Take 220 mg by mouth.    [provider]  NONFORMULARY OR COMPOUNDED ITEM 152 mcg T4/ 36 mcg T3    [provider]  Probiotic Product (ACIDOPHILUS PROBIOTIC BLEND PO) Take by mouth.    [provider]    No Known Allergies  Past Surgical History:  Procedure Laterality Date  . BREAST BIOPSY Right 1987   neg  . BREAST SURGERY     RIGHT BREAST BIOPSY  . BUNIONECTOMY    . COLONOSCOPY  2011   benign polyps - f/u 2016  . COLONOSCOPY WITH PROPOFOL N/A 07/21/2017   Procedure: COLONOSCOPY WITH PROPOFOL;  Surgeon: Lollie Sails, MD;  Location[facility] ENDOSCOPY;  Service: Endoscopy;  Laterality: N/A;  . FRACTURE SURGERY     MAXILLARY FACIAL SURGERY  . MANDIBLE FRACTURE SURGERY     jaw resection  . shoulder arthroscopy Right   . SHOULDER ARTHROSCOPY WITH OPEN ROTATOR CUFF REPAIR Left 01/30/2015   Procedure: SHOULDER ARTHROSCOPY WITH POSS OPEN ROTATOR CUFF REPAIR;  Surgeon: Leanor Kail, MD;  Location: [place];  Service: Orthopedics;  Laterality: Left;  Release of long head of biceps tendon Subacromial decompression Mini open rotator cuff repair  . TONSILLECTOMY    . TOTAL ABDOMINAL HYSTERECTOMY      Social History   Tobacco Use  . Smoking status: Former Research scientist (life sciences)  . Smokeless tobacco:  Never Used  . Tobacco comment: quit at age 68  Substance Use Topics  . Alcohol use: No    Alcohol/week: 0.0 oz  . Drug use: No     Medication list has been reviewed and updated.  PHQ 2/9 Scores 10/23/2017 05/05/2017 04/10/2017 04/11/2016  PHQ - 2 Score 0 0 0 2  PHQ- 9 Score 0 0 - 2    Physical Exam  Constitutional: She is oriented to person, place, and time. She appears well-developed. No distress.  HENT:  Head: Normocephalic and atraumatic.  Pulmonary/Chest: Effort normal. No respiratory  distress.  Musculoskeletal: Normal range of motion.  Neurological: She is alert and oriented to person, place, and time.  Skin: Skin is warm and dry. No rash noted.  Psychiatric: She has a normal mood and affect. Her behavior is normal. Thought content normal.    BP (!) 144/84   Pulse 99   Ht 5\' 1"  (1.549 m)   Wt 234 lb (106.1 kg)   SpO2 97%   BMI 44.21 kg/m   Assessment and Plan: 1. Hypothyroidism due to Hashimoto's thyroiditis Will give written Rx for T4 95 mcg/T3 22.5 mcg - take twice a day Call for follow up labs in 6-8 weeks   No orders of the defined types were placed in this encounter.   Partially dictated using Editor, commissioning. Any errors are unintentional.  Halina Maidens, MD [facility]  11/08/2017

## 2017-11-29 ENCOUNTER — Other Ambulatory Visit: Payer: Self-pay | Admitting: Internal Medicine

## 2018-04-24 ENCOUNTER — Ambulatory Visit (INDEPENDENT_AMBULATORY_CARE_PROVIDER_SITE_OTHER): Payer: Medicare HMO | Admitting: Internal Medicine

## 2018-04-24 ENCOUNTER — Encounter: Payer: Self-pay | Admitting: Internal Medicine

## 2018-04-24 VITALS — BP 132/78 | HR 90 | Temp 98.2°F | Ht 61.0 in | Wt 228.0 lb

## 2018-04-24 DIAGNOSIS — E063 Autoimmune thyroiditis: Secondary | ICD-10-CM

## 2018-04-24 DIAGNOSIS — E559 Vitamin D deficiency, unspecified: Secondary | ICD-10-CM | POA: Diagnosis not present

## 2018-04-24 DIAGNOSIS — E782 Mixed hyperlipidemia: Secondary | ICD-10-CM

## 2018-04-24 DIAGNOSIS — E038 Other specified hypothyroidism: Secondary | ICD-10-CM | POA: Diagnosis not present

## 2018-04-24 DIAGNOSIS — J0100 Acute maxillary sinusitis, unspecified: Secondary | ICD-10-CM | POA: Diagnosis not present

## 2018-04-24 DIAGNOSIS — I10 Essential (primary) hypertension: Secondary | ICD-10-CM | POA: Diagnosis not present

## 2018-04-24 DIAGNOSIS — F3341 Major depressive disorder, recurrent, in partial remission: Secondary | ICD-10-CM | POA: Diagnosis not present

## 2018-04-24 MED ORDER — FLUOXETINE HCL 20 MG PO CAPS: 20.0000 mg | ORAL_CAPSULE | Freq: Every day | ORAL | 12 refills | Status: DC

## 2018-04-24 MED ORDER — HYDROCHLOROTHIAZIDE 25 MG PO TABS: 25.0000 mg | ORAL_TABLET | Freq: Every day | ORAL | 12 refills | Status: DC

## 2018-04-24 MED ORDER — LISINOPRIL 20 MG PO TABS: 20.0000 mg | ORAL_TABLET | Freq: Every day | ORAL | 12 refills | Status: DC

## 2018-04-24 MED ORDER — AMOXICILLIN 875 MG PO TABS: 875.0000 mg | ORAL_TABLET | Freq: Two times a day (BID) | ORAL | 0 refills | Status: AC

## 2018-04-24 NOTE — Progress Notes (Signed)
Date:  04/24/2018   Name:  Deanna Johnson   DOB:  1950-11-03   MRN:  [id number]   Chief Complaint: Hypertension; Hypothyroidism (Recheck thyroid.); and Nasal Congestion (2 weeks. Drainage, and left facial pressure/ pain. Discharge is green. No fever. No cough.)  Hypertension  This is a chronic problem. The problem is controlled. Pertinent negatives include no chest pain, headaches, palpitations or shortness of breath. Past treatments include ACE inhibitors and diuretics. The current treatment provides significant improvement. Identifiable causes of hypertension include a thyroid problem.  Thyroid Problem  Presents for follow-up visit. Patient reports no anxiety, diaphoresis or palpitations. Her past medical history is significant for hyperlipidemia.  Hyperlipidemia  This is a chronic problem. The problem is uncontrolled. Associated symptoms include myalgias. Pertinent negatives include no chest pain or shortness of breath.  Depression         This is a chronic problem.  Associated symptoms include myalgias.  Associated symptoms include no headaches.  Past treatments include SSRIs - Selective serotonin reuptake inhibitors.  Compliance with treatment is good.  Past medical history includes thyroid problem.   Sinusitis  This is a new problem. The current episode started 1 to 4 weeks ago. The problem is unchanged. There has been no fever. Associated symptoms include congestion and sinus pressure. Pertinent negatives include no chills, coughing, diaphoresis, ear pain, headaches, shortness of breath, sore throat or swollen glands.    Lab Results  Component Value Date   TSH 0.24 (A) 08/24/2017   Lab Results  Component Value Date   CREATININE 1.0 08/24/2017   BUN 16 08/24/2017   NA 140 02/11/2017   K 4.2 02/11/2017   CL 103 04/11/2016   CO2 26 04/11/2016   Lab Results  Component Value Date   CHOL 279 (H) 10/23/2017   HDL 70 10/23/2017   LDLCALC 172 (H) 10/23/2017   TRIG 186 (H)  10/23/2017   CHOLHDL 4.0 10/23/2017      Review of Systems  Constitutional: Negative for chills, diaphoresis and fever.  HENT: Positive for congestion and sinus pressure. Negative for ear pain and sore throat.   Eyes: Negative for visual disturbance.  Respiratory: Negative for cough, chest tightness, shortness of breath and wheezing.   Cardiovascular: Negative for chest pain, palpitations and leg swelling.  Gastrointestinal: Negative for abdominal pain.  Musculoskeletal: Positive for arthralgias and myalgias.  Neurological: Negative for dizziness, seizures, light-headedness, numbness and headaches.  Hematological: Negative for adenopathy.  Psychiatric/Behavioral: Positive for depression. Negative for dysphoric mood and sleep disturbance. The patient is not nervous/anxious.     Patient Active Problem List   Diagnosis Date Noted  . Hyperlipidemia, mixed 05/05/2017  . Hypothyroidism due to Hashimoto's thyroiditis 05/05/2017  . Recurrent major depression in partial remission ([place]) 04/11/2016  . Hx of colonic polyp 04/11/2016  . Arthritis 12/31/2014  . Essential hypertension 12/31/2014  . Impingement syndrome of left shoulder 12/16/2014  . H/O fibromyalgia 08/18/2012    No Known Allergies  Past Surgical History:  Procedure Laterality Date  . BREAST BIOPSY Right 1987   neg  . BREAST SURGERY     RIGHT BREAST BIOPSY  . BUNIONECTOMY    . COLONOSCOPY  2011   benign polyps - f/u 2016  . COLONOSCOPY WITH PROPOFOL N/A 07/21/2017   Procedure: COLONOSCOPY WITH PROPOFOL;  Surgeon: Lollie Sails, MD;  Location: [facility] ENDOSCOPY;  Service: Endoscopy;  Laterality: N/A;  . FRACTURE SURGERY     MAXILLARY FACIAL SURGERY  . MANDIBLE FRACTURE SURGERY  jaw resection  . shoulder arthroscopy Right   . SHOULDER ARTHROSCOPY WITH OPEN ROTATOR CUFF REPAIR Left 01/30/2015   Procedure: SHOULDER ARTHROSCOPY WITH POSS OPEN ROTATOR CUFF REPAIR;  Surgeon: Leanor Kail, MD;  Location: [place];  Service: Orthopedics;  Laterality: Left;  Release of long head of biceps tendon Subacromial decompression Mini open rotator cuff repair  . TONSILLECTOMY    . TOTAL ABDOMINAL HYSTERECTOMY      Social History   Tobacco Use  . Smoking status: Former Research scientist (life sciences)  . Smokeless tobacco: Never Used  . Tobacco comment: quit at age 86  Substance Use Topics  . Alcohol use: No    Alcohol/week: 0.0 standard drinks  . Drug use: No     Medication list has been reviewed and updated.  Current Meds  Medication Sig  . Cholecalciferol 2000 units TABS Take 6,000 Units by mouth daily.   . Flaxseed, Linseed, (FLAXSEED OIL PO) Take by mouth.  Marland Kitchen FLUoxetine (PROZAC) 20 MG capsule Take 1 capsule (20 mg total) by mouth daily.  . fluticasone (FLONASE) 50 MCG/ACT nasal spray INSTILL 2 SPRAYS INTO BOTH NOSTRILS ONCEA DAY AS DIRECTED  . hydrochlorothiazide (HYDRODIURIL) 25 MG tablet Take 1 tablet (25 mg total) by mouth daily.  Marland Kitchen lisinopril (PRINIVIL,ZESTRIL) 20 MG tablet Take 1 tablet (20 mg total) by mouth daily.  . Multiple Vitamins-Minerals (CENTRUM SILVER PO) Take by mouth.  . naproxen sodium (ALEVE) 220 MG tablet Take 220 mg by mouth.  . NONFORMULARY OR COMPOUNDED ITEM 152 mcg T4/ 36 mcg T3  . Probiotic Product (ACIDOPHILUS PROBIOTIC BLEND PO) Take by mouth.  . [DISCONTINUED] FLUoxetine (PROZAC) 20 MG capsule Take 1 capsule (20 mg total) by mouth daily.  . [DISCONTINUED] hydrochlorothiazide (HYDRODIURIL) 25 MG tablet Take 1 tablet (25 mg total) by mouth daily.  . [DISCONTINUED] lisinopril (PRINIVIL,ZESTRIL) 20 MG tablet Take 1 tablet (20 mg total) by mouth daily.    PHQ 2/9 Scores 04/24/2018 10/23/2017 05/05/2017 04/10/2017  PHQ - 2 Score 0 0 0 0  PHQ- 9 Score 0 0 0 -    Physical Exam  Constitutional: She is oriented to person, place, and time. She appears well-developed and well-nourished. No distress.  HENT:  Head: Normocephalic and atraumatic.  Right Ear: External ear and ear canal  normal. Tympanic membrane is not erythematous and not retracted.  Left Ear: External ear and ear canal normal. Tympanic membrane is not erythematous and not retracted.  Nose: Right sinus exhibits maxillary sinus tenderness and frontal sinus tenderness. Left sinus exhibits maxillary sinus tenderness and frontal sinus tenderness.  Mouth/Throat: Uvula is midline and mucous membranes are normal. No oral lesions. Posterior oropharyngeal erythema present. No oropharyngeal exudate.  Neck: Normal range of motion. Neck supple.  Cardiovascular: Normal rate, regular rhythm and normal heart sounds.  Pulmonary/Chest: Effort normal and breath sounds normal. No respiratory distress. She has no wheezes. She has no rales.  Musculoskeletal: She exhibits no edema or tenderness.  Lymphadenopathy:    She has no cervical adenopathy.  Neurological: She is alert and oriented to person, place, and time.  Skin: Skin is warm and dry. No rash noted.  Psychiatric: She has a normal mood and affect. Her behavior is normal. Thought content normal.  Nursing note and vitals reviewed.   BP 132/78 (BP Location: Right Arm, Patient Position: Sitting, Cuff Size: Normal)   Pulse 90   Temp 98.2 F (36.8 C) (Oral)   Ht 5\' 1"  (1.549 m)   Wt 228 lb (103.4 kg)  SpO2 98%   BMI 43.08 kg/m   Assessment and Plan: 1. Essential hypertension controlled - CBC with Differential/Platelet - Comprehensive metabolic panel - lisinopril (PRINIVIL,ZESTRIL) 20 MG tablet; Take 1 tablet (20 mg total) by mouth daily.  Dispense: 30 tablet; Refill: 12 - hydrochlorothiazide (HYDRODIURIL) 25 MG tablet; Take 1 tablet (25 mg total) by mouth daily.  Dispense: 30 tablet; Refill: 12  2. Hypothyroidism due to Hashimoto's thyroiditis Continue compounded medication - Thyroid Panel With TSH - Thyroid Peroxidase Antibodies (TPO) (REFL) - T3  3. Hyperlipidemia, mixed Pt declines statin therapy due to fibromyalgia   4. Recurrent major depression in  partial remission ([facility]) Doing well on prozac - FLUoxetine (PROZAC) 20 MG capsule; Take 1 capsule (20 mg total) by mouth daily.  Dispense: 30 capsule; Refill: 12  5. Acute non-recurrent maxillary sinusitis Continue sudafed, zyrtec and Neti Pot rinses - amoxicillin (AMOXIL) 875 MG tablet; Take 1 tablet (875 mg total) by mouth 2 (two) times daily for 10 days.  Dispense: 20 tablet; Refill: 0  6. Vitamin D deficiency Continue supplementation - VITAMIN D 25 Hydroxy (Vit-D Deficiency, Fractures)   Partially dictated using Editor, commissioning. Any errors are unintentional.  Halina Maidens, MD [facility]  04/24/2018

## 2018-04-25 LAB — CBC WITH DIFFERENTIAL/PLATELET
BASOS ABS: 0 10*3/uL (ref 0.0–0.2)
BASOS: 1 %
EOS (ABSOLUTE): 0.2 10*3/uL (ref 0.0–0.4)
EOS: 4 %
HEMOGLOBIN: 13.3 g/dL (ref 11.1–15.9)
Hematocrit: 39.5 % (ref 34.0–46.6)
IMMATURE GRANS (ABS): 0 10*3/uL (ref 0.0–0.1)
IMMATURE GRANULOCYTES: 0 %
LYMPHS: 29 %
Lymphocytes Absolute: 1.8 10*3/uL (ref 0.7–3.1)
MCH: 27.5 pg (ref 26.6–33.0)
MCHC: 33.7 g/dL (ref 31.5–35.7)
MCV: 82 fL (ref 79–97)
MONOCYTES: 9 %
Monocytes Absolute: 0.6 10*3/uL (ref 0.1–0.9)
NEUTROS ABS: 3.4 10*3/uL (ref 1.4–7.0)
Neutrophils: 57 %
Platelets: 318 10*3/uL (ref 150–450)
RBC: 4.83 x10E6/uL (ref 3.77–5.28)
RDW: 13 % (ref 12.3–15.4)
WBC: 6.1 10*3/uL (ref 3.4–10.8)

## 2018-04-25 LAB — COMPREHENSIVE METABOLIC PANEL WITH GFR
ALT: 14 [IU]/L (ref 0–32)
AST: 16 [IU]/L (ref 0–40)
Albumin/Globulin Ratio: 1.7 (ref 1.2–2.2)
Albumin: 4.1 g/dL (ref 3.6–4.8)
Alkaline Phosphatase: 71 [IU]/L (ref 39–117)
BUN/Creatinine Ratio: 19 (ref 12–28)
BUN: 14 mg/dL (ref 8–27)
Bilirubin Total: 0.5 mg/dL (ref 0.0–1.2)
CO2: 25 mmol/L (ref 20–29)
Calcium: 9.6 mg/dL (ref 8.7–10.3)
Chloride: 100 mmol/L (ref 96–106)
Creatinine, Ser: 0.73 mg/dL (ref 0.57–1.00)
GFR calc Af Amer: 98 mL/min/{1.73_m2}
GFR calc non Af Amer: 85 mL/min/{1.73_m2}
Globulin, Total: 2.4 g/dL (ref 1.5–4.5)
Glucose: 90 mg/dL (ref 65–99)
Potassium: 4.6 mmol/L (ref 3.5–5.2)
Sodium: 139 mmol/L (ref 134–144)
Total Protein: 6.5 g/dL (ref 6.0–8.5)

## 2018-04-25 LAB — T3: T3 TOTAL: 184 ng/dL — AB (ref 71–180)

## 2018-04-25 LAB — THYROID PANEL WITH TSH
FREE THYROXINE INDEX: 3.3 (ref 1.2–4.9)
T3 Uptake Ratio: 37 % (ref 24–39)
T4, Total: 8.9 ug/dL (ref 4.5–12.0)

## 2018-04-25 LAB — VITAMIN D 25 HYDROXY (VIT D DEFICIENCY, FRACTURES): Vit D, 25-Hydroxy: 40.5 ng/mL (ref 30.0–100.0)

## 2018-04-25 NOTE — Progress Notes (Signed)
Tried calling patient about results. Unable to reach her about this. Unable to leave Vm. Phone hangs up. Will try again.

## 2018-05-28 ENCOUNTER — Ambulatory Visit: Payer: Medicare HMO

## 2018-06-25 ENCOUNTER — Other Ambulatory Visit: Payer: Self-pay | Admitting: Physician Assistant

## 2018-06-25 DIAGNOSIS — J320 Chronic maxillary sinusitis: Secondary | ICD-10-CM

## 2018-06-25 DIAGNOSIS — J019 Acute sinusitis, unspecified: Secondary | ICD-10-CM | POA: Diagnosis not present

## 2018-06-25 DIAGNOSIS — K219 Gastro-esophageal reflux disease without esophagitis: Secondary | ICD-10-CM | POA: Diagnosis not present

## 2018-06-25 DIAGNOSIS — M792 Neuralgia and neuritis, unspecified: Secondary | ICD-10-CM | POA: Diagnosis not present

## 2018-06-25 DIAGNOSIS — J309 Allergic rhinitis, unspecified: Secondary | ICD-10-CM | POA: Diagnosis not present

## 2018-06-25 DIAGNOSIS — G501 Atypical facial pain: Secondary | ICD-10-CM | POA: Diagnosis not present

## 2018-06-27 DIAGNOSIS — M546 Pain in thoracic spine: Secondary | ICD-10-CM | POA: Diagnosis not present

## 2018-06-27 DIAGNOSIS — Z6841 Body Mass Index (BMI) 40.0 and over, adult: Secondary | ICD-10-CM

## 2018-06-27 DIAGNOSIS — M549 Dorsalgia, unspecified: Secondary | ICD-10-CM | POA: Diagnosis not present

## 2018-06-27 DIAGNOSIS — M5430 Sciatica, unspecified side: Secondary | ICD-10-CM | POA: Diagnosis not present

## 2018-06-27 DIAGNOSIS — G8929 Other chronic pain: Secondary | ICD-10-CM | POA: Diagnosis not present

## 2018-06-29 ENCOUNTER — Ambulatory Visit: Admission: RE | Admit: 2018-06-29 | Payer: Medicare HMO | Source: Ambulatory Visit

## 2018-06-29 ENCOUNTER — Other Ambulatory Visit: Payer: Self-pay | Admitting: Unknown Physician Specialty

## 2018-06-29 DIAGNOSIS — M546 Pain in thoracic spine: Secondary | ICD-10-CM

## 2018-07-26 ENCOUNTER — Ambulatory Visit: Payer: Medicare HMO

## 2018-07-30 ENCOUNTER — Other Ambulatory Visit: Payer: Self-pay | Admitting: Internal Medicine

## 2018-07-30 DIAGNOSIS — Z1231 Encounter for screening mammogram for malignant neoplasm of breast: Secondary | ICD-10-CM

## 2018-08-14 ENCOUNTER — Ambulatory Visit
Admission: RE | Admit: 2018-08-14 | Discharge: 2018-08-14 | Disposition: A | Discharge status: Home / Self Care | Payer: Medicare HMO | Source: Ambulatory Visit | Attending: Internal Medicine | Admitting: Internal Medicine

## 2018-08-14 DIAGNOSIS — Z1231 Encounter for screening mammogram for malignant neoplasm of breast: Secondary | ICD-10-CM | POA: Insufficient documentation

## 2018-08-24 DIAGNOSIS — H40003 Preglaucoma, unspecified, bilateral: Secondary | ICD-10-CM | POA: Diagnosis not present

## 2018-09-20 DIAGNOSIS — M542 Cervicalgia: Secondary | ICD-10-CM | POA: Diagnosis not present

## 2018-09-20 DIAGNOSIS — Z6841 Body Mass Index (BMI) 40.0 and over, adult: Secondary | ICD-10-CM | POA: Diagnosis not present

## 2018-09-20 DIAGNOSIS — M5030 Other cervical disc degeneration, unspecified cervical region: Secondary | ICD-10-CM | POA: Diagnosis not present

## 2018-09-20 DIAGNOSIS — M5412 Radiculopathy, cervical region: Secondary | ICD-10-CM | POA: Diagnosis not present

## 2018-10-03 DIAGNOSIS — M5412 Radiculopathy, cervical region: Secondary | ICD-10-CM | POA: Diagnosis not present

## 2018-10-03 DIAGNOSIS — G8929 Other chronic pain: Secondary | ICD-10-CM | POA: Diagnosis not present

## 2018-10-03 DIAGNOSIS — M6281 Muscle weakness (generalized): Secondary | ICD-10-CM | POA: Diagnosis not present

## 2018-10-03 DIAGNOSIS — M436 Torticollis: Secondary | ICD-10-CM | POA: Diagnosis not present

## 2018-10-03 DIAGNOSIS — M542 Cervicalgia: Secondary | ICD-10-CM | POA: Diagnosis not present

## 2018-10-08 DIAGNOSIS — M6281 Muscle weakness (generalized): Secondary | ICD-10-CM | POA: Diagnosis not present

## 2018-10-08 DIAGNOSIS — M5412 Radiculopathy, cervical region: Secondary | ICD-10-CM | POA: Diagnosis not present

## 2018-10-08 DIAGNOSIS — M436 Torticollis: Secondary | ICD-10-CM | POA: Diagnosis not present

## 2018-10-08 DIAGNOSIS — M542 Cervicalgia: Secondary | ICD-10-CM | POA: Diagnosis not present

## 2018-10-08 DIAGNOSIS — G8929 Other chronic pain: Secondary | ICD-10-CM | POA: Diagnosis not present

## 2018-10-16 ENCOUNTER — Other Ambulatory Visit: Payer: Self-pay | Admitting: Internal Medicine

## 2018-10-16 ENCOUNTER — Encounter: Payer: Self-pay | Admitting: Internal Medicine

## 2018-10-16 NOTE — Telephone Encounter (Signed)
Please advise patient questions.

## 2018-10-17 ENCOUNTER — Ambulatory Visit: Payer: Medicare HMO

## 2018-10-17 DIAGNOSIS — M436 Torticollis: Secondary | ICD-10-CM | POA: Diagnosis not present

## 2018-10-17 DIAGNOSIS — M542 Cervicalgia: Secondary | ICD-10-CM | POA: Diagnosis not present

## 2018-10-17 DIAGNOSIS — M6281 Muscle weakness (generalized): Secondary | ICD-10-CM | POA: Diagnosis not present

## 2018-10-17 DIAGNOSIS — M5412 Radiculopathy, cervical region: Secondary | ICD-10-CM | POA: Diagnosis not present

## 2018-10-17 DIAGNOSIS — G8929 Other chronic pain: Secondary | ICD-10-CM | POA: Diagnosis not present

## 2018-10-18 ENCOUNTER — Telehealth: Payer: Self-pay

## 2018-10-18 NOTE — Telephone Encounter (Signed)
Attempted to reach patient to scheduled AWV virtual visit for Monday 10/22/18 prior to office visit last week. Able to schedule patient via WebEx, FaceTime or [employer].   Requested patient to call the office or reach me directly at [phone] to schedule virtual visit or r/s AWV for later in the year.

## 2018-10-22 ENCOUNTER — Encounter: Payer: Self-pay | Admitting: Internal Medicine

## 2018-10-22 DIAGNOSIS — M6281 Muscle weakness (generalized): Secondary | ICD-10-CM | POA: Diagnosis not present

## 2018-10-22 DIAGNOSIS — G8929 Other chronic pain: Secondary | ICD-10-CM | POA: Diagnosis not present

## 2018-10-22 DIAGNOSIS — M542 Cervicalgia: Secondary | ICD-10-CM | POA: Diagnosis not present

## 2018-10-22 DIAGNOSIS — M5412 Radiculopathy, cervical region: Secondary | ICD-10-CM | POA: Diagnosis not present

## 2018-10-22 DIAGNOSIS — M436 Torticollis: Secondary | ICD-10-CM | POA: Diagnosis not present

## 2018-10-23 ENCOUNTER — Ambulatory Visit: Payer: Medicare HMO | Admitting: Internal Medicine

## 2018-10-24 ENCOUNTER — Ambulatory Visit: Payer: Medicare HMO | Admitting: Internal Medicine

## 2018-10-24 ENCOUNTER — Telehealth: Payer: Self-pay | Admitting: Internal Medicine

## 2018-10-24 DIAGNOSIS — G8929 Other chronic pain: Secondary | ICD-10-CM | POA: Diagnosis not present

## 2018-10-24 DIAGNOSIS — M5412 Radiculopathy, cervical region: Secondary | ICD-10-CM | POA: Diagnosis not present

## 2018-10-24 DIAGNOSIS — M542 Cervicalgia: Secondary | ICD-10-CM | POA: Diagnosis not present

## 2018-10-24 DIAGNOSIS — M436 Torticollis: Secondary | ICD-10-CM | POA: Diagnosis not present

## 2018-10-24 DIAGNOSIS — M6281 Muscle weakness (generalized): Secondary | ICD-10-CM | POA: Diagnosis not present

## 2018-10-24 NOTE — Telephone Encounter (Signed)
Called to RE-schedule Medicare Annual Wellness Visit with Nurse Health Advisor.  ° °If patient returns call, please schedule AWV with NHA ~~AFTER June 1ST~~ ° ° °For any questions please contact:  °Kathryn Brown [phone] or skype at: [email or web address]  ° ° °

## 2018-10-25 ENCOUNTER — Other Ambulatory Visit: Payer: Self-pay

## 2018-10-25 ENCOUNTER — Encounter: Payer: Self-pay | Admitting: Internal Medicine

## 2018-10-25 ENCOUNTER — Ambulatory Visit (INDEPENDENT_AMBULATORY_CARE_PROVIDER_SITE_OTHER): Payer: Medicare HMO | Admitting: Internal Medicine

## 2018-10-25 VITALS — BP 138/78 | HR 97 | Resp 16 | Ht 61.0 in | Wt 223.0 lb

## 2018-10-25 DIAGNOSIS — F3341 Major depressive disorder, recurrent, in partial remission: Secondary | ICD-10-CM

## 2018-10-25 DIAGNOSIS — Z0000 Encounter for general adult medical examination without abnormal findings: Secondary | ICD-10-CM

## 2018-10-25 DIAGNOSIS — Z1231 Encounter for screening mammogram for malignant neoplasm of breast: Secondary | ICD-10-CM | POA: Diagnosis not present

## 2018-10-25 DIAGNOSIS — E782 Mixed hyperlipidemia: Secondary | ICD-10-CM | POA: Diagnosis not present

## 2018-10-25 DIAGNOSIS — I10 Essential (primary) hypertension: Secondary | ICD-10-CM

## 2018-10-25 DIAGNOSIS — E063 Autoimmune thyroiditis: Secondary | ICD-10-CM

## 2018-10-25 DIAGNOSIS — M5412 Radiculopathy, cervical region: Secondary | ICD-10-CM | POA: Diagnosis not present

## 2018-10-25 DIAGNOSIS — M4712 Other spondylosis with myelopathy, cervical region: Secondary | ICD-10-CM | POA: Insufficient documentation

## 2018-10-25 DIAGNOSIS — E038 Other specified hypothyroidism: Secondary | ICD-10-CM | POA: Diagnosis not present

## 2018-10-25 DIAGNOSIS — Z23 Encounter for immunization: Secondary | ICD-10-CM | POA: Diagnosis not present

## 2018-10-25 LAB — POCT URINALYSIS DIPSTICK
Bilirubin, UA: NEGATIVE
Blood, UA: NEGATIVE
Glucose, UA: NEGATIVE
Ketones, UA: NEGATIVE
Leukocytes, UA: NEGATIVE
Nitrite, UA: NEGATIVE
Protein, UA: NEGATIVE
Spec Grav, UA: 1.01 (ref 1.010–1.025)
Urobilinogen, UA: 0.2 E.U./dL
pH, UA: 7.5 (ref 5.0–8.0)

## 2018-10-25 MED ORDER — ZOSTER VAC RECOMB ADJUVANTED 50 MCG/0.5ML IM SUSR: 0.5000 mL | Freq: Once | INTRAMUSCULAR | 1 refills | Status: AC

## 2018-10-25 NOTE — Progress Notes (Signed)
Date:  10/25/2018   Name:  DEDEE LISS   DOB:  09-22-1950   MRN:  [id number]   Chief Complaint: Annual Exam (Breast Exam. ) Deanna Johnson is a 68 y.o. female who presents today for her Complete Annual Exam. She feels fairly well. She reports exercising some, gardening and doing home PT. She reports she is sleeping fairly well.  Mammogram 07/2018 CRC screening:  08/2017 Immunizations up to date  Hypertension  This is a chronic problem. The problem is controlled. Associated symptoms include neck pain. Pertinent negatives include no chest pain, headaches, palpitations or shortness of breath. Identifiable causes of hypertension include a thyroid problem.  Hyperlipidemia  This is a chronic problem. The problem is uncontrolled. Pertinent negatives include no chest pain or shortness of breath.  Thyroid Problem  Presents for follow-up visit. Patient reports no anxiety, constipation, diarrhea, fatigue, palpitations or tremors. The symptoms have been stable. Her past medical history is significant for hyperlipidemia.  Depression         This is a chronic problem.The problem is unchanged.  Associated symptoms include no fatigue and no headaches.  Past treatments include SSRIs - Selective serotonin reuptake inhibitors.  Compliance with treatment is good.  Previous treatment provided significant relief.  Past medical history includes thyroid problem.   Neck Pain   This is a chronic problem. The problem occurs constantly. The problem has been gradually improving (pinched nerve in neck with left arm sx). The pain is mild. Pertinent negatives include no chest pain, fever, headaches or trouble swallowing. She has tried home exercises and NSAIDs (Physical therapy from [facility]) for the symptoms. The treatment provided mild relief.  Tick Bite - about 10 days ago, attached only for a few hours.  She is sure it was a [employer] tick.  So far, no symptoms of RMSF or infection at the site.  Lab Results  Component  Value Date   CREATININE 0.73 04/24/2018   BUN 14 04/24/2018   NA 139 04/24/2018   K 4.6 04/24/2018   CL 100 04/24/2018   CO2 25 04/24/2018   Lab Results  Component Value Date   CHOL 279 (H) 10/23/2017   HDL 70 10/23/2017   LDLCALC 172 (H) 10/23/2017   TRIG 186 (H) 10/23/2017   CHOLHDL 4.0 10/23/2017   Lab Results  Component Value Date   TSH <0.006 (L) 04/24/2018     Review of Systems  Constitutional: Negative for chills, fatigue and fever.  HENT: Negative for congestion, hearing loss, tinnitus, trouble swallowing and voice change.   Eyes: Negative for visual disturbance.  Respiratory: Negative for cough, chest tightness, shortness of breath and wheezing.   Cardiovascular: Negative for chest pain, palpitations and leg swelling.  Gastrointestinal: Negative for abdominal pain, constipation, diarrhea and vomiting.  Endocrine: Negative for polydipsia and polyuria.  Genitourinary: Negative for dysuria, frequency, genital sores, vaginal bleeding and vaginal discharge.  Musculoskeletal: Positive for neck pain. Negative for arthralgias, gait problem and joint swelling.  Skin: Positive for wound (tick bite). Negative for color change and rash.  Neurological: Negative for dizziness, tremors, light-headedness and headaches.  Hematological: Negative for adenopathy. Does not bruise/bleed easily.  Psychiatric/Behavioral: Positive for depression. Negative for dysphoric mood and sleep disturbance. The patient is not nervous/anxious.     Patient Active Problem List   Diagnosis Date Noted  . Morbid obesity with BMI of 40.0-44.9, adult ([place]) 06/27/2018  . Hyperlipidemia, mixed 05/05/2017  . Hypothyroidism due to Hashimoto's thyroiditis 05/05/2017  .  Recurrent major depression in partial remission ([place]) 04/11/2016  . Hx of colonic polyp 04/11/2016  . Arthritis 12/31/2014  . Essential hypertension 12/31/2014  . Impingement syndrome of left shoulder 12/16/2014  . H/O fibromyalgia 08/18/2012     No Known Allergies  Past Surgical History:  Procedure Laterality Date  . BREAST BIOPSY Right 1987   neg  . BREAST SURGERY     RIGHT BREAST BIOPSY  . BUNIONECTOMY    . COLONOSCOPY  2011   benign polyps - f/u 2016  . COLONOSCOPY WITH PROPOFOL N/A 07/21/2017   Procedure: COLONOSCOPY WITH PROPOFOL;  Surgeon: Lollie Sails, MD;  Location: [facility] ENDOSCOPY;  Service: Endoscopy;  Laterality: N/A;  . FRACTURE SURGERY     MAXILLARY FACIAL SURGERY  . MANDIBLE FRACTURE SURGERY     jaw resection  . shoulder arthroscopy Right   . SHOULDER ARTHROSCOPY WITH OPEN ROTATOR CUFF REPAIR Left 01/30/2015   Procedure: SHOULDER ARTHROSCOPY WITH POSS OPEN ROTATOR CUFF REPAIR;  Surgeon: Leanor Kail, MD;  Location: [place];  Service: Orthopedics;  Laterality: Left;  Release of long head of biceps tendon Subacromial decompression Mini open rotator cuff repair  . TONSILLECTOMY    . TOTAL ABDOMINAL HYSTERECTOMY      Social History   Tobacco Use  . Smoking status: Former Smoker    Packs/day: 0.25    Years: 2.00    Pack years: 0.50    Types: Cigarettes  . Smokeless tobacco: Never Used  . Tobacco comment: quit at age 43  Substance Use Topics  . Alcohol use: No    Alcohol/week: 0.0 standard drinks  . Drug use: No     Medication list has been reviewed and updated.  Current Meds  Medication Sig  . celecoxib (CELEBREX) 50 MG capsule Take 50 mg by mouth 2 (two) times daily.  . Cholecalciferol 2000 units TABS Take 6,000 Units by mouth daily.   Marland Kitchen FLUoxetine (PROZAC) 20 MG capsule Take 1 capsule (20 mg total) by mouth daily.  . fluticasone (FLONASE) 50 MCG/ACT nasal spray INSTILL 2 SPRAYS INTO BOTH NOSTRILS ONCEA DAY AS DIRECTED  . hydrochlorothiazide (HYDRODIURIL) 25 MG tablet Take 1 tablet (25 mg total) by mouth daily.  Marland Kitchen lisinopril (PRINIVIL,ZESTRIL) 20 MG tablet Take 1 tablet (20 mg total) by mouth daily.  . Multiple Vitamins-Minerals (CENTRUM SILVER PO) Take by mouth.  .  NONFORMULARY OR COMPOUNDED ITEM 152 mcg T4/ 36 mcg T3  . Probiotic Product (ACIDOPHILUS PROBIOTIC BLEND PO) Take by mouth.    PHQ 2/9 Scores 10/25/2018 04/24/2018 10/23/2017 05/05/2017  PHQ - 2 Score 1 0 0 0  PHQ- 9 Score 3 0 0 0   Fall Risk  10/25/2018 04/24/2018 04/10/2017 04/11/2016  Falls in the past year? 0 No No No  Number falls in past yr: 0 - - -  Injury with Fall? 0 - - -    BP Readings from Last 3 Encounters:  10/25/18 138/78  04/24/18 132/78  11/08/17 (!) 144/84    Physical Exam Vitals signs and nursing note reviewed.  Constitutional:      General: She is not in acute distress.    Appearance: She is well-developed.  HENT:     Head: Normocephalic and atraumatic.     Right Ear: Tympanic membrane and ear canal normal.     Left Ear: Tympanic membrane and ear canal normal.     Nose: Nose normal.     Right Sinus: No maxillary sinus tenderness.     Left Sinus:  No maxillary sinus tenderness.     Mouth/Throat:     Mouth: Mucous membranes are moist.     Pharynx: Uvula midline.  Eyes:     General: No scleral icterus.       Right eye: No discharge.        Left eye: No discharge.     Conjunctiva/sclera: Conjunctivae normal.  Neck:     Musculoskeletal: Normal range of motion. No erythema.     Thyroid: No thyromegaly.     Vascular: No carotid bruit.  Cardiovascular:     Rate and Rhythm: Normal rate and regular rhythm.     Pulses: Normal pulses.     Heart sounds: Normal heart sounds.  Pulmonary:     Effort: Pulmonary effort is normal. No respiratory distress.     Breath sounds: No wheezing.  Chest:     Breasts:        Right: No mass, nipple discharge, skin change or tenderness.        Left: No mass, nipple discharge, skin change or tenderness.  Abdominal:     General: Bowel sounds are normal.     Palpations: Abdomen is soft.     Tenderness: There is no abdominal tenderness.  Musculoskeletal: Normal range of motion.  Lymphadenopathy:     Cervical: No cervical adenopathy.   Skin:    General: Skin is warm and dry.     Findings: No rash.       Neurological:     Mental Status: She is alert and oriented to person, place, and time.     Cranial Nerves: No cranial nerve deficit.     Sensory: No sensory deficit.     Deep Tendon Reflexes: Reflexes are normal and symmetric.  Psychiatric:        Speech: Speech normal.        Behavior: Behavior normal.        Thought Content: Thought content normal.     Wt Readings from Last 3 Encounters:  10/25/18 223 lb (101.2 kg)  04/24/18 228 lb (103.4 kg)  11/08/17 234 lb (106.1 kg)    BP 138/78   Pulse 97   Resp 16   Ht 5\' 1"  (1.549 m)   Wt 223 lb (101.2 kg)   SpO2 95%   BMI 42.14 kg/m   Assessment and Plan: 1. Annual physical exam Normal exam except for weight Work on more activity, improve diet - POCT urinalysis dipstick  2. Encounter for screening mammogram for breast cancer Due next January  3. Essential hypertension controlled - CBC with Differential/Platelet - Comprehensive metabolic panel  4. Hypothyroidism due to Hashimoto's thyroiditis On bio-identical supplements from [facility] - TSH + free T4 - T3, free  5. Hyperlipidemia, mixed Check labs and advise - Lipid panel  6. Recurrent major depression in partial remission ([place]) Doing well on Prozac  7. Need for shingles vaccine She will get this at her pharmacy - Zoster Vaccine Adjuvanted [facility]) injection; Inject 0.5 mLs into the muscle once for 1 dose.  Dispense: 0.5 mL; Refill: 1  8. Cervical radiculopathy Continue Celebrex, PTx Follow up with Orthopedics if needed   Partially dictated using Editor, commissioning. Any errors are unintentional.  Halina Maidens, MD [facility]  10/25/2018

## 2018-10-25 NOTE — Patient Instructions (Signed)
This information is directly available on the CDC website: https://www.cdc.gov/coronavirus/2019-ncov/if-you-are-sick/steps-when-sick.html    Source:CDC Reference to specific commercial products, manufacturers, companies, or trademarks does not constitute its endorsement or recommendation by the U.S. Government, Department of Health and Human Services, or Centers for Disease Control and Prevention.  

## 2018-10-26 LAB — COMPREHENSIVE METABOLIC PANEL
ALT: 14 IU/L (ref 0–32)
AST: 15 IU/L (ref 0–40)
Albumin/Globulin Ratio: 2 (ref 1.2–2.2)
Albumin: 4.4 g/dL (ref 3.8–4.8)
Alkaline Phosphatase: 68 IU/L (ref 39–117)
BUN/Creatinine Ratio: 21 (ref 12–28)
BUN: 17 mg/dL (ref 8–27)
Bilirubin Total: 0.4 mg/dL (ref 0.0–1.2)
CO2: 24 mmol/L (ref 20–29)
Calcium: 9.4 mg/dL (ref 8.7–10.3)
Chloride: 100 mmol/L (ref 96–106)
Creatinine, Ser: 0.8 mg/dL (ref 0.57–1.00)
GFR calc Af Amer: 87 mL/min/{1.73_m2} (ref >59)
GFR calc non Af Amer: 75 mL/min/{1.73_m2} (ref >59)
Globulin, Total: 2.2 g/dL (ref 1.5–4.5)
Glucose: 98 mg/dL (ref 65–99)
Potassium: 4.4 mmol/L (ref 3.5–5.2)
Sodium: 140 mmol/L (ref 134–144)
Total Protein: 6.6 g/dL (ref 6.0–8.5)

## 2018-10-26 LAB — TSH+FREE T4
Free T4: 1.15 ng/dL (ref 0.82–1.77)
TSH: 0.022 u[IU]/mL — ABNORMAL LOW (ref 0.450–4.500)

## 2018-10-26 LAB — CBC WITH DIFFERENTIAL/PLATELET
Basophils Absolute: 0.1 10*3/uL (ref 0.0–0.2)
Basos: 1 %
EOS (ABSOLUTE): 0.3 10*3/uL (ref 0.0–0.4)
Eos: 4 %
Hematocrit: 41.9 % (ref 34.0–46.6)
Hemoglobin: 13.6 g/dL (ref 11.1–15.9)
Immature Grans (Abs): 0 10*3/uL (ref 0.0–0.1)
Immature Granulocytes: 0 %
Lymphocytes Absolute: 2 10*3/uL (ref 0.7–3.1)
Lymphs: 28 %
MCH: 27.2 pg (ref 26.6–33.0)
MCHC: 32.5 g/dL (ref 31.5–35.7)
MCV: 84 fL (ref 79–97)
Monocytes Absolute: 0.6 10*3/uL (ref 0.1–0.9)
Monocytes: 8 %
Neutrophils Absolute: 4.2 10*3/uL (ref 1.4–7.0)
Neutrophils: 59 %
Platelets: 355 10*3/uL (ref 150–450)
RBC: 5 x10E6/uL (ref 3.77–5.28)
RDW: 14.4 % (ref 11.7–15.4)
WBC: 7 10*3/uL (ref 3.4–10.8)

## 2018-10-26 LAB — LIPID PANEL
Chol/HDL Ratio: 3.6 ratio (ref 0.0–4.4)
Cholesterol, Total: 266 mg/dL — ABNORMAL HIGH (ref 100–199)
HDL: 73 mg/dL (ref >39)
LDL Calculated: 163 mg/dL — ABNORMAL HIGH (ref 0–99)
Triglycerides: 149 mg/dL (ref 0–149)
VLDL Cholesterol Cal: 30 mg/dL (ref 5–40)

## 2018-10-26 LAB — T3, FREE: T3, Free: 4.5 pg/mL — ABNORMAL HIGH (ref 2.0–4.4)

## 2018-11-01 DIAGNOSIS — M542 Cervicalgia: Secondary | ICD-10-CM | POA: Diagnosis not present

## 2018-11-01 DIAGNOSIS — G8929 Other chronic pain: Secondary | ICD-10-CM | POA: Diagnosis not present

## 2018-11-01 DIAGNOSIS — M436 Torticollis: Secondary | ICD-10-CM | POA: Diagnosis not present

## 2018-11-01 DIAGNOSIS — M5412 Radiculopathy, cervical region: Secondary | ICD-10-CM | POA: Diagnosis not present

## 2018-11-01 DIAGNOSIS — M6281 Muscle weakness (generalized): Secondary | ICD-10-CM | POA: Diagnosis not present

## 2018-11-05 DIAGNOSIS — G8929 Other chronic pain: Secondary | ICD-10-CM | POA: Diagnosis not present

## 2018-11-05 DIAGNOSIS — M5412 Radiculopathy, cervical region: Secondary | ICD-10-CM | POA: Diagnosis not present

## 2018-11-05 DIAGNOSIS — M436 Torticollis: Secondary | ICD-10-CM | POA: Diagnosis not present

## 2018-11-05 DIAGNOSIS — M542 Cervicalgia: Secondary | ICD-10-CM | POA: Diagnosis not present

## 2018-11-05 DIAGNOSIS — M6281 Muscle weakness (generalized): Secondary | ICD-10-CM | POA: Diagnosis not present

## 2018-11-07 DIAGNOSIS — G8929 Other chronic pain: Secondary | ICD-10-CM | POA: Diagnosis not present

## 2018-11-07 DIAGNOSIS — M542 Cervicalgia: Secondary | ICD-10-CM | POA: Diagnosis not present

## 2018-11-07 DIAGNOSIS — M5412 Radiculopathy, cervical region: Secondary | ICD-10-CM | POA: Diagnosis not present

## 2018-11-07 DIAGNOSIS — M6281 Muscle weakness (generalized): Secondary | ICD-10-CM | POA: Diagnosis not present

## 2018-11-07 DIAGNOSIS — M436 Torticollis: Secondary | ICD-10-CM | POA: Diagnosis not present

## 2018-11-12 DIAGNOSIS — M542 Cervicalgia: Secondary | ICD-10-CM | POA: Diagnosis not present

## 2018-11-12 DIAGNOSIS — G8929 Other chronic pain: Secondary | ICD-10-CM | POA: Diagnosis not present

## 2018-11-12 DIAGNOSIS — M6281 Muscle weakness (generalized): Secondary | ICD-10-CM | POA: Diagnosis not present

## 2018-11-12 DIAGNOSIS — M5412 Radiculopathy, cervical region: Secondary | ICD-10-CM | POA: Diagnosis not present

## 2018-11-12 DIAGNOSIS — M436 Torticollis: Secondary | ICD-10-CM | POA: Diagnosis not present

## 2018-11-14 DIAGNOSIS — G8929 Other chronic pain: Secondary | ICD-10-CM | POA: Diagnosis not present

## 2018-11-14 DIAGNOSIS — M436 Torticollis: Secondary | ICD-10-CM | POA: Diagnosis not present

## 2018-11-14 DIAGNOSIS — M542 Cervicalgia: Secondary | ICD-10-CM | POA: Diagnosis not present

## 2018-11-14 DIAGNOSIS — M6281 Muscle weakness (generalized): Secondary | ICD-10-CM | POA: Diagnosis not present

## 2018-11-14 DIAGNOSIS — M5412 Radiculopathy, cervical region: Secondary | ICD-10-CM | POA: Diagnosis not present

## 2018-11-26 DIAGNOSIS — M436 Torticollis: Secondary | ICD-10-CM | POA: Diagnosis not present

## 2018-11-26 DIAGNOSIS — G8929 Other chronic pain: Secondary | ICD-10-CM | POA: Diagnosis not present

## 2018-11-26 DIAGNOSIS — M542 Cervicalgia: Secondary | ICD-10-CM | POA: Diagnosis not present

## 2018-11-26 DIAGNOSIS — M6281 Muscle weakness (generalized): Secondary | ICD-10-CM | POA: Diagnosis not present

## 2018-11-26 DIAGNOSIS — M5412 Radiculopathy, cervical region: Secondary | ICD-10-CM | POA: Diagnosis not present

## 2018-11-28 DIAGNOSIS — M542 Cervicalgia: Secondary | ICD-10-CM | POA: Diagnosis not present

## 2018-11-28 DIAGNOSIS — G8929 Other chronic pain: Secondary | ICD-10-CM | POA: Diagnosis not present

## 2018-11-28 DIAGNOSIS — M6281 Muscle weakness (generalized): Secondary | ICD-10-CM | POA: Diagnosis not present

## 2018-11-28 DIAGNOSIS — M5412 Radiculopathy, cervical region: Secondary | ICD-10-CM | POA: Diagnosis not present

## 2018-11-28 DIAGNOSIS — M436 Torticollis: Secondary | ICD-10-CM | POA: Diagnosis not present

## 2018-12-03 ENCOUNTER — Ambulatory Visit: Admit: 2018-12-03 | Payer: Medicare HMO | Admitting: Ophthalmology

## 2018-12-03 DIAGNOSIS — M436 Torticollis: Secondary | ICD-10-CM | POA: Diagnosis not present

## 2018-12-03 DIAGNOSIS — M542 Cervicalgia: Secondary | ICD-10-CM | POA: Diagnosis not present

## 2018-12-03 DIAGNOSIS — G8929 Other chronic pain: Secondary | ICD-10-CM | POA: Diagnosis not present

## 2018-12-03 DIAGNOSIS — M6281 Muscle weakness (generalized): Secondary | ICD-10-CM | POA: Diagnosis not present

## 2018-12-03 DIAGNOSIS — M5412 Radiculopathy, cervical region: Secondary | ICD-10-CM | POA: Diagnosis not present

## 2018-12-03 SURGERY — PHACOEMULSIFICATION, CATARACT, WITH IOL INSERTION
Anesthesia: Topical | Laterality: Left

## 2018-12-05 DIAGNOSIS — G8929 Other chronic pain: Secondary | ICD-10-CM | POA: Diagnosis not present

## 2018-12-05 DIAGNOSIS — M436 Torticollis: Secondary | ICD-10-CM | POA: Diagnosis not present

## 2018-12-05 DIAGNOSIS — M542 Cervicalgia: Secondary | ICD-10-CM | POA: Diagnosis not present

## 2018-12-05 DIAGNOSIS — M6281 Muscle weakness (generalized): Secondary | ICD-10-CM | POA: Diagnosis not present

## 2018-12-05 DIAGNOSIS — M5412 Radiculopathy, cervical region: Secondary | ICD-10-CM | POA: Diagnosis not present

## 2018-12-12 DIAGNOSIS — G8929 Other chronic pain: Secondary | ICD-10-CM | POA: Diagnosis not present

## 2018-12-12 DIAGNOSIS — M542 Cervicalgia: Secondary | ICD-10-CM | POA: Diagnosis not present

## 2018-12-12 DIAGNOSIS — M5412 Radiculopathy, cervical region: Secondary | ICD-10-CM | POA: Diagnosis not present

## 2018-12-12 DIAGNOSIS — M436 Torticollis: Secondary | ICD-10-CM | POA: Diagnosis not present

## 2018-12-14 DIAGNOSIS — M5412 Radiculopathy, cervical region: Secondary | ICD-10-CM | POA: Diagnosis not present

## 2018-12-14 DIAGNOSIS — G8929 Other chronic pain: Secondary | ICD-10-CM | POA: Diagnosis not present

## 2018-12-14 DIAGNOSIS — M542 Cervicalgia: Secondary | ICD-10-CM | POA: Diagnosis not present

## 2018-12-14 DIAGNOSIS — M436 Torticollis: Secondary | ICD-10-CM | POA: Diagnosis not present

## 2018-12-19 DIAGNOSIS — M436 Torticollis: Secondary | ICD-10-CM | POA: Diagnosis not present

## 2018-12-19 DIAGNOSIS — M542 Cervicalgia: Secondary | ICD-10-CM | POA: Diagnosis not present

## 2018-12-19 DIAGNOSIS — M5412 Radiculopathy, cervical region: Secondary | ICD-10-CM | POA: Diagnosis not present

## 2018-12-19 DIAGNOSIS — G8929 Other chronic pain: Secondary | ICD-10-CM | POA: Diagnosis not present

## 2018-12-24 DIAGNOSIS — G8929 Other chronic pain: Secondary | ICD-10-CM | POA: Diagnosis not present

## 2018-12-24 DIAGNOSIS — M6281 Muscle weakness (generalized): Secondary | ICD-10-CM | POA: Diagnosis not present

## 2018-12-24 DIAGNOSIS — M436 Torticollis: Secondary | ICD-10-CM | POA: Diagnosis not present

## 2018-12-24 DIAGNOSIS — M5412 Radiculopathy, cervical region: Secondary | ICD-10-CM | POA: Diagnosis not present

## 2018-12-24 DIAGNOSIS — M542 Cervicalgia: Secondary | ICD-10-CM | POA: Diagnosis not present

## 2018-12-26 DIAGNOSIS — M436 Torticollis: Secondary | ICD-10-CM | POA: Diagnosis not present

## 2018-12-26 DIAGNOSIS — M5412 Radiculopathy, cervical region: Secondary | ICD-10-CM | POA: Diagnosis not present

## 2018-12-26 DIAGNOSIS — M6281 Muscle weakness (generalized): Secondary | ICD-10-CM | POA: Diagnosis not present

## 2018-12-26 DIAGNOSIS — G8929 Other chronic pain: Secondary | ICD-10-CM | POA: Diagnosis not present

## 2018-12-26 DIAGNOSIS — M542 Cervicalgia: Secondary | ICD-10-CM | POA: Diagnosis not present

## 2018-12-31 ENCOUNTER — Encounter: Payer: Self-pay | Admitting: *Deleted

## 2018-12-31 ENCOUNTER — Other Ambulatory Visit: Payer: Self-pay

## 2018-12-31 DIAGNOSIS — H2512 Age-related nuclear cataract, left eye: Secondary | ICD-10-CM | POA: Diagnosis not present

## 2018-12-31 DIAGNOSIS — I10 Essential (primary) hypertension: Secondary | ICD-10-CM | POA: Diagnosis not present

## 2018-12-31 DIAGNOSIS — R011 Cardiac murmur, unspecified: Secondary | ICD-10-CM | POA: Diagnosis not present

## 2019-01-01 ENCOUNTER — Other Ambulatory Visit
Admission: RE | Admit: 2019-01-01 | Discharge: 2019-01-01 | Disposition: A | Discharge status: Home / Self Care | Payer: Medicare HMO | Source: Ambulatory Visit | Attending: Ophthalmology | Admitting: Ophthalmology

## 2019-01-01 DIAGNOSIS — Z1159 Encounter for screening for other viral diseases: Secondary | ICD-10-CM | POA: Insufficient documentation

## 2019-01-01 NOTE — Anesthesia Preprocedure Evaluation (Addendum)
Anesthesia Evaluation  Patient identified by MRN, date of birth, ID band Patient awake    Reviewed: Allergy & Precautions, NPO status , Patient's Chart, lab work & pertinent test results  History of Anesthesia Complications Negative for: history of anesthetic complications  Airway Mallampati: III   Neck ROM: Full    Dental  (+)    Pulmonary former smoker (quit 1972),    Pulmonary exam normal breath sounds clear to auscultation       Cardiovascular hypertension,  Rhythm:Regular Rate:Normal + Systolic murmurs Murmur    Neuro/Psych PSYCHIATRIC DISORDERS Depression negative neurological ROS     GI/Hepatic negative GI ROS,   Endo/Other  Hypothyroidism Morbid obesity  Renal/GU negative Renal ROS     Musculoskeletal  (+) Arthritis , Fibromyalgia -  Abdominal   Peds  Hematology negative hematology ROS (+)   Anesthesia Other Findings   Reproductive/Obstetrics                            Anesthesia Physical Anesthesia Plan  ASA: II  Anesthesia Plan: MAC   Post-op Pain Management:    Induction: Intravenous  PONV Risk Score and Plan: 2 and TIVA and Midazolam  Airway Management Planned: Natural Airway  Additional Equipment:   Intra-op Plan:   Post-operative Plan:   Informed Consent: I have reviewed the patients History and Physical, chart, labs and discussed the procedure including the risks, benefits and alternatives for the proposed anesthesia with the patient or authorized representative who has indicated his/her understanding and acceptance.       Plan Discussed with: CRNA  Anesthesia Plan Comments:        Anesthesia Quick Evaluation

## 2019-01-02 DIAGNOSIS — M436 Torticollis: Secondary | ICD-10-CM | POA: Diagnosis not present

## 2019-01-02 DIAGNOSIS — G8929 Other chronic pain: Secondary | ICD-10-CM | POA: Diagnosis not present

## 2019-01-02 DIAGNOSIS — M5412 Radiculopathy, cervical region: Secondary | ICD-10-CM | POA: Diagnosis not present

## 2019-01-02 DIAGNOSIS — M542 Cervicalgia: Secondary | ICD-10-CM | POA: Diagnosis not present

## 2019-01-02 DIAGNOSIS — M6281 Muscle weakness (generalized): Secondary | ICD-10-CM | POA: Diagnosis not present

## 2019-01-02 LAB — NOVEL CORONAVIRUS, NAA (HOSP ORDER, SEND-OUT TO REF LAB; TAT 18-24 HRS): SARS-CoV-2, NAA: NOT DETECTED

## 2019-01-03 NOTE — Discharge Instructions (Signed)

## 2019-01-04 ENCOUNTER — Ambulatory Visit
Admission: RE | Admit: 2019-01-04 | Discharge: 2019-01-04 | Disposition: A | Discharge status: Home / Self Care | Payer: Medicare HMO | Attending: Ophthalmology | Admitting: Ophthalmology

## 2019-01-04 ENCOUNTER — Ambulatory Visit: Payer: Medicare HMO | Admitting: Anesthesiology

## 2019-01-04 ENCOUNTER — Encounter
Admission: RE | Disposition: A | Discharge status: Home / Self Care | Payer: Self-pay | Source: Home / Self Care | Attending: Ophthalmology

## 2019-01-04 ENCOUNTER — Other Ambulatory Visit: Payer: Self-pay

## 2019-01-04 DIAGNOSIS — Z6841 Body Mass Index (BMI) 40.0 and over, adult: Secondary | ICD-10-CM | POA: Diagnosis not present

## 2019-01-04 DIAGNOSIS — Z79899 Other long term (current) drug therapy: Secondary | ICD-10-CM | POA: Diagnosis not present

## 2019-01-04 DIAGNOSIS — I10 Essential (primary) hypertension: Secondary | ICD-10-CM | POA: Insufficient documentation

## 2019-01-04 DIAGNOSIS — H2512 Age-related nuclear cataract, left eye: Secondary | ICD-10-CM | POA: Insufficient documentation

## 2019-01-04 DIAGNOSIS — F329 Major depressive disorder, single episode, unspecified: Secondary | ICD-10-CM | POA: Diagnosis not present

## 2019-01-04 DIAGNOSIS — Z87891 Personal history of nicotine dependence: Secondary | ICD-10-CM | POA: Insufficient documentation

## 2019-01-04 DIAGNOSIS — E063 Autoimmune thyroiditis: Secondary | ICD-10-CM | POA: Diagnosis not present

## 2019-01-04 DIAGNOSIS — H25812 Combined forms of age-related cataract, left eye: Secondary | ICD-10-CM | POA: Diagnosis not present

## 2019-01-04 HISTORY — PX: CATARACT EXTRACTION W/PHACO: SHX586

## 2019-01-04 SURGERY — PHACOEMULSIFICATION, CATARACT, WITH IOL INSERTION
Anesthesia: Monitor Anesthesia Care | Site: Eye | Laterality: Left

## 2019-01-04 MED ORDER — LIDOCAINE HCL (PF) 2 % IJ SOLN
INTRAOCULAR | Status: DC | PRN
  Administered 2019-01-04: 14:00:00 1 mL via INTRAOCULAR

## 2019-01-04 MED ORDER — TETRACAINE HCL 0.5 % OP SOLN
1.0000 [drp] | OPHTHALMIC | Status: DC | PRN
  Administered 2019-01-04 (×3): 1 [drp] via OPHTHALMIC

## 2019-01-04 MED ORDER — FENTANYL CITRATE (PF) 100 MCG/2ML IJ SOLN
INTRAMUSCULAR | Status: DC | PRN
  Administered 2019-01-04: 50 ug via INTRAVENOUS

## 2019-01-04 MED ORDER — MOXIFLOXACIN HCL 0.5 % OP SOLN
OPHTHALMIC | Status: DC | PRN
  Administered 2019-01-04: 0.2 mL via OPHTHALMIC

## 2019-01-04 MED ORDER — EPINEPHRINE PF 1 MG/ML IJ SOLN
INTRAOCULAR | Status: DC | PRN
  Administered 2019-01-04: 14:00:00 71 mL via OPHTHALMIC

## 2019-01-04 MED ORDER — SODIUM HYALURONATE 10 MG/ML IO SOLN
INTRAOCULAR | Status: DC | PRN
  Administered 2019-01-04: 0.55 mL via INTRAOCULAR

## 2019-01-04 MED ORDER — MIDAZOLAM HCL 2 MG/2ML IJ SOLN
INTRAMUSCULAR | Status: DC | PRN
  Administered 2019-01-04 (×2): 1 mg via INTRAVENOUS

## 2019-01-04 MED ORDER — ARMC OPHTHALMIC DILATING DROPS
1.0000 "application " | OPHTHALMIC | Status: DC | PRN
  Administered 2019-01-04 (×3): 1 via OPHTHALMIC

## 2019-01-04 MED ORDER — SODIUM HYALURONATE 23 MG/ML IO SOLN
INTRAOCULAR | Status: DC | PRN
  Administered 2019-01-04: 0.6 mL via INTRAOCULAR

## 2019-01-04 MED ORDER — HYDRALAZINE HCL 20 MG/ML IJ SOLN
10.0000 mg | Freq: Once | INTRAMUSCULAR | Status: AC
  Administered 2019-01-04: 10 mg via INTRAVENOUS

## 2019-01-04 SURGICAL SUPPLY — 17 items
CANNULA ANT/CHMB 27GA (MISCELLANEOUS) ×6 IMPLANT
DISSECTOR HYDRO NUCLEUS 50X22 (MISCELLANEOUS) ×3 IMPLANT
GLOVE SURG LX 7.5 STRW (GLOVE) ×2
GLOVE SURG LX STRL 7.5 STRW (GLOVE) ×1 IMPLANT
GLOVE SURG SYN 8.5  E (GLOVE) ×2
GLOVE SURG SYN 8.5 E (GLOVE) ×1 IMPLANT
GOWN STRL REUS W/ TWL LRG LVL3 (GOWN DISPOSABLE) ×2 IMPLANT
GOWN STRL REUS W/TWL LRG LVL3 (GOWN DISPOSABLE) ×4
LENS IOL TECNIS ITEC 16.5 (Intraocular Lens) ×3 IMPLANT
MARKER SKIN DUAL TIP RULER LAB (MISCELLANEOUS) ×3 IMPLANT
PACK DR. KING ARMS (PACKS) ×3 IMPLANT
PACK EYE AFTER SURG (MISCELLANEOUS) ×3 IMPLANT
PACK OPTHALMIC (MISCELLANEOUS) ×3 IMPLANT
SYR 3ML LL SCALE MARK (SYRINGE) ×3 IMPLANT
SYR TB 1ML LUER SLIP (SYRINGE) ×3 IMPLANT
WATER STERILE IRR 250ML POUR (IV SOLUTION) ×3 IMPLANT
WIPE NON LINTING 3.25X3.25 (MISCELLANEOUS) ×3 IMPLANT

## 2019-01-04 NOTE — H&P (Signed)

## 2019-01-04 NOTE — Op Note (Signed)
OPERATIVE NOTE  Deanna Johnson [id number] 01/04/2019   PREOPERATIVE DIAGNOSIS:  Nuclear sclerotic cataract left eye.  H25.12   POSTOPERATIVE DIAGNOSIS:    Nuclear sclerotic cataract left eye.     PROCEDURE:  Phacoemusification with posterior chamber intraocular lens placement of the left eye   LENS:   Implant Name Type Inv. Item Serial No. Manufacturer Lot No. LRB No. Used Action  LENS IOL DIOP 16.5 - [id number] Intraocular Lens LENS IOL DIOP 16.5 [id number] AMO  Left 1 Implanted       PCB00 +16.5   ULTRASOUND TIME: 1 minutes 11 seconds.  CDE 12.49   SURGEON:  Benay Pillow, MD, MPH   ANESTHESIA:  Topical with tetracaine drops augmented with 1% preservative-free intracameral lidocaine.  ESTIMATED BLOOD LOSS: <1 mL   COMPLICATIONS:  None.   DESCRIPTION OF PROCEDURE:  The patient was identified in the holding room and transported to the operating room and placed in the supine position under the operating microscope.  The left eye was identified as the operative eye and it was prepped and draped in the usual sterile ophthalmic fashion.   A 1.0 millimeter clear-corneal paracentesis was made at the 5:00 position. 0.5 ml of preservative-free 1% lidocaine with epinephrine was injected into the anterior chamber.  The anterior chamber was filled with Healon 5 viscoelastic.  A 2.4 millimeter keratome was used to make a near-clear corneal incision at the 2:00 position.  A curvilinear capsulorrhexis was made with a cystotome and capsulorrhexis forceps.  Balanced salt solution was used to hydrodissect and hydrodelineate the nucleus.   Phacoemulsification was then used in stop and chop fashion to remove the lens nucleus and epinucleus.  The remaining cortex was then removed using the irrigation and aspiration handpiece. Healon was then placed into the capsular bag to distend it for lens placement.  A lens was then injected into the capsular bag.  The remaining viscoelastic was aspirated.    Wounds were hydrated with balanced salt solution.  The anterior chamber was inflated to a physiologic pressure with balanced salt solution.  Intracameral vigamox 0.1 mL undiltued was injected into the eye and a drop placed onto the ocular surface.  No wound leaks were noted.  The patient was taken to the recovery room in stable condition without complications of anesthesia or surgery  Benay Pillow 01/04/2019, 2:12 PM

## 2019-01-04 NOTE — Anesthesia Procedure Notes (Signed)
Procedure Name: MAC Date/Time: 01/04/2019 1:50 PM Performed by: Cameron Ali, CRNA Pre-anesthesia Checklist: Patient identified, Emergency Drugs available, Suction available, Timeout performed and Patient being monitored Patient Re-evaluated:Patient Re-evaluated prior to induction Oxygen Delivery Method: Nasal cannula Placement Confirmation: positive ETCO2

## 2019-01-04 NOTE — Transfer of Care (Signed)
Immediate Anesthesia Transfer of Care Note  Patient: Deanna Johnson  Procedure(s) Performed: CATARACT EXTRACTION PHACO AND INTRAOCULAR LENS PLACEMENT (IOC)  LEFT (Left Eye)  Patient Location: PACU  Anesthesia Type: MAC  Level of Consciousness: awake, alert  and patient cooperative  Airway and Oxygen Therapy: Patient Spontanous Breathing and Patient connected to supplemental oxygen  Post-op Assessment: Post-op Vital signs reviewed, Patient's Cardiovascular Status Stable, Respiratory Function Stable, Patent Airway and No signs of Nausea or vomiting  Post-op Vital Signs: Reviewed and stable  Complications: No apparent anesthesia complications

## 2019-01-04 NOTE — Anesthesia Postprocedure Evaluation (Signed)
Anesthesia Post Note  Patient: Deanna Johnson  Procedure(s) Performed: CATARACT EXTRACTION PHACO AND INTRAOCULAR LENS PLACEMENT ([place])  LEFT (Left Eye)  Patient location during evaluation: PACU Anesthesia Type: MAC Level of consciousness: awake and alert, oriented and patient cooperative Pain management: pain level controlled Vital Signs Assessment: post-procedure vital signs reviewed and stable Respiratory status: spontaneous breathing, nonlabored ventilation and respiratory function stable Cardiovascular status: blood pressure returned to baseline and stable Postop Assessment: adequate PO intake Anesthetic complications: no    Darrin Nipper

## 2019-01-07 ENCOUNTER — Other Ambulatory Visit: Payer: Self-pay | Admitting: Internal Medicine

## 2019-01-07 ENCOUNTER — Encounter: Payer: Self-pay | Admitting: Ophthalmology

## 2019-01-11 DIAGNOSIS — E039 Hypothyroidism, unspecified: Secondary | ICD-10-CM | POA: Diagnosis not present

## 2019-01-11 DIAGNOSIS — H2511 Age-related nuclear cataract, right eye: Secondary | ICD-10-CM | POA: Diagnosis not present

## 2019-01-14 DIAGNOSIS — M542 Cervicalgia: Secondary | ICD-10-CM | POA: Diagnosis not present

## 2019-01-14 DIAGNOSIS — M5412 Radiculopathy, cervical region: Secondary | ICD-10-CM | POA: Diagnosis not present

## 2019-01-14 DIAGNOSIS — M436 Torticollis: Secondary | ICD-10-CM | POA: Diagnosis not present

## 2019-01-14 DIAGNOSIS — M6281 Muscle weakness (generalized): Secondary | ICD-10-CM | POA: Diagnosis not present

## 2019-01-14 DIAGNOSIS — G8929 Other chronic pain: Secondary | ICD-10-CM | POA: Diagnosis not present

## 2019-01-16 DIAGNOSIS — M5412 Radiculopathy, cervical region: Secondary | ICD-10-CM | POA: Diagnosis not present

## 2019-01-16 DIAGNOSIS — M436 Torticollis: Secondary | ICD-10-CM | POA: Diagnosis not present

## 2019-01-16 DIAGNOSIS — G8929 Other chronic pain: Secondary | ICD-10-CM | POA: Diagnosis not present

## 2019-01-16 DIAGNOSIS — M542 Cervicalgia: Secondary | ICD-10-CM | POA: Diagnosis not present

## 2019-01-16 DIAGNOSIS — M6281 Muscle weakness (generalized): Secondary | ICD-10-CM | POA: Diagnosis not present

## 2019-01-21 ENCOUNTER — Ambulatory Visit (INDEPENDENT_AMBULATORY_CARE_PROVIDER_SITE_OTHER): Payer: Medicare HMO

## 2019-01-21 ENCOUNTER — Other Ambulatory Visit: Payer: Self-pay

## 2019-01-21 VITALS — BP 132/70 | HR 86 | Temp 98.2°F | Resp 16 | Ht 61.0 in | Wt 223.4 lb

## 2019-01-21 DIAGNOSIS — Z0000 Encounter for general adult medical examination without abnormal findings: Secondary | ICD-10-CM

## 2019-01-21 NOTE — Progress Notes (Signed)
Subjective:   Deanna Johnson is a 68 y.o. female who presents for Medicare Annual (Subsequent) preventive examination.  Review of Systems:   Cardiac Risk Factors include: advanced age (>36men, >84 women);hypertension;obesity (BMI >30kg/m2)     Objective:     Vitals: BP 132/70 (BP Location: Left Arm, Patient Position: Sitting, Cuff Size: Large)   Pulse 86   Temp 98.2 F (36.8 C) (Oral)   Resp 16   Ht 5\' 1"  (1.549 m)   Wt 223 lb 6.4 oz (101.3 kg)   SpO2 96%   BMI 42.21 kg/m   Body mass index is 42.21 kg/m.  Advanced Directives 01/21/2019 01/04/2019 07/21/2017 04/10/2017 04/11/2016 01/30/2015  Does Patient Have a Medical Advance Directive? Yes Yes Yes Yes Yes No  Type of Paramedic of [place];Living will [place];Living will [place];Living will [place];Living will [place];Living will -  Copy of [place] in Chart? No - copy requested No - copy requested No - copy requested No - copy requested - -  Would patient like information on creating a medical advance directive? - - - - - Yes - Educational materials given    Tobacco Social History   Tobacco Use  Smoking Status Former Smoker  . Packs/day: 0.25  . Years: 2.00  . Pack years: 0.50  . Types: Cigarettes  . Quit date: 81  . Years since quitting: 48.5  Smokeless Tobacco Never Used  Tobacco Comment   quit at age 19     Counseling given: Not Answered Comment: quit at age 89   Clinical Intake:  Pre-visit preparation completed: Yes  Pain : 0-10 Pain Score: 3  Pain Type: Acute pain Pain Location: Neck Pain Orientation: Left Pain Descriptors / Indicators: Pins and needles, Spasm, Discomfort Pain Onset: More than a month ago Pain Frequency: Constant     BMI - recorded: 42.21 Nutritional Status: BMI > 30  Obese Nutritional Risks: None Diabetes: No  How often do you need to have someone  help you when you read instructions, pamphlets, or other written materials from your doctor or pharmacy?: 1 - Never  Interpreter Needed?: No  Information entered by :: Clemetine Marker LPN  Past Medical History:  Diagnosis Date  . Arthritis   . Complete tear of rotator cuff    RIGHT  . Depression   . Fibromyalgia   . Heart murmur    slight no problems  . History of tick-borne disease   . Hypertension   . Hypothyroidism   . Shoulder pain, left    TORN ROTATOR CUFF   Past Surgical History:  Procedure Laterality Date  . BREAST BIOPSY Right 1987   neg  . BREAST SURGERY     RIGHT BREAST BIOPSY  . BUNIONECTOMY    . CATARACT EXTRACTION W/PHACO Left 01/04/2019   Procedure: CATARACT EXTRACTION PHACO AND INTRAOCULAR LENS PLACEMENT ([place])  LEFT;  Surgeon: Eulogio Bear, MD;  Location: [facility];  Service: Ophthalmology;  Laterality: Left;  . COLONOSCOPY  2011   benign polyps - f/u 2016  . COLONOSCOPY WITH PROPOFOL N/A 07/21/2017   Procedure: COLONOSCOPY WITH PROPOFOL;  Surgeon: Lollie Sails, MD;  Location: [facility] ENDOSCOPY;  Service: Endoscopy;  Laterality: N/A;  . FRACTURE SURGERY     MAXILLARY FACIAL SURGERY  . MANDIBLE FRACTURE SURGERY     jaw resection  . shoulder arthroscopy Right   . SHOULDER ARTHROSCOPY WITH OPEN ROTATOR  CUFF REPAIR Left 01/30/2015   Procedure: SHOULDER ARTHROSCOPY WITH POSS OPEN ROTATOR CUFF REPAIR;  Surgeon: Leanor Kail, MD;  Location: [facility];  Service: Orthopedics;  Laterality: Left;  Release of long head of biceps tendon Subacromial decompression Mini open rotator cuff repair  . TONSILLECTOMY    . TOTAL ABDOMINAL HYSTERECTOMY    . TUBAL LIGATION  1978   Family History  Problem Relation Age of Onset  . Breast cancer Maternal Aunt 86  . Diabetes Maternal Aunt   . Hypertension Mother   . Bursitis Mother   . Early death Father    Social History   Socioeconomic History  . Marital status: Divorced    Spouse name: Not on  file  . Number of children: 1  . Years of education: Not on file  . Highest education level: Not on file  Occupational History  . Occupation: retired  Scientific laboratory technician  . Financial resource strain: Not hard at all  . Food insecurity    Worry: Never true    Inability: Never true  . Transportation needs    Medical: No    Non-medical: No  Tobacco Use  . Smoking status: Former Smoker    Packs/day: 0.25    Years: 2.00    Pack years: 0.50    Types: Cigarettes    Quit date: 1972    Years since quitting: 48.5  . Smokeless tobacco: Never Used  . Tobacco comment: quit at age 74  Substance and Sexual Activity  . Alcohol use: No    Alcohol/week: 0.0 standard drinks  . Drug use: No  . Sexual activity: Not Currently    Birth control/protection: Post-menopausal  Lifestyle  . Physical activity    Days per week: 0 days    Minutes per session: 0 min  . Stress: Not at all  Relationships  . Social connections    Talks on phone: More than three times a week    Gets together: Three times a week    Attends religious service: Never    Active member of club or organization: No    Attends meetings of clubs or organizations: Never    Relationship status: Divorced  Other Topics Concern  . Not on file  Social History Narrative  . Not on file    Outpatient Encounter Medications as of 01/21/2019  Medication Sig  . Cholecalciferol 2000 units TABS Take 6,000 Units by mouth daily.   Marland Kitchen FLUoxetine (PROZAC) 20 MG capsule Take 1 capsule (20 mg total) by mouth daily.  . fluticasone (FLONASE) 50 MCG/ACT nasal spray INSTILL 2 SPRAYS INTO BOTH NOSTRILS ONCEA DAY AS DIRECTED  . hydrochlorothiazide (HYDRODIURIL) 25 MG tablet Take 1 tablet (25 mg total) by mouth daily.  Marland Kitchen lisinopril (PRINIVIL,ZESTRIL) 20 MG tablet Take 1 tablet (20 mg total) by mouth daily.  . Multiple Vitamins-Minerals (CENTRUM SILVER PO) Take by mouth.  . naproxen sodium (ALEVE) 220 MG tablet Take 220 mg by mouth 2 (two) times daily as  needed.  . NONFORMULARY OR COMPOUNDED ITEM 152 mcg T4/ 36 mcg T3  . Probiotic Product (ACIDOPHILUS PROBIOTIC BLEND PO) Take by mouth.   No facility-administered encounter medications on file as of 01/21/2019.     Activities of Daily Living In your present state of health, do you have any difficulty performing the following activities: 01/21/2019 01/04/2019  Hearing? N N  Comment declines hearing aids -  Vision? N N  Comment wears glasses, having cataract surgery -  Difficulty concentrating or making  decisions? N N  Walking or climbing stairs? N N  Dressing or bathing? N N  Doing errands, shopping? N -  Preparing Food and eating ? N -  Using the Toilet? N -  In the past six months, have you accidently leaked urine? Y -  Comment wears pads for protection -  Do you have problems with loss of bowel control? N -  Managing your Medications? N -  Managing your Finances? N -  Housekeeping or managing your Housekeeping? N -  Some recent data might be hidden    Patient Care Team: Glean Hess, MD as PCP - General (Internal Medicine) Reche Dixon, PA-C (Orthopedic Surgery) Anne Hahn, MD as Referring Physician (Family Medicine) Leanor Kail, MD (Orthopedic Surgery)    Assessment:   This is a routine wellness examination for Deanna Johnson.  Exercise Activities and Dietary recommendations Current Exercise Habits: The patient does not participate in regular exercise at present, Exercise limited by: orthopedic condition(s)  Goals    . Increase physical activity     Recommend increasing physical activity to at least 3 days per week       Fall Risk Fall Risk  01/21/2019 10/25/2018 04/24/2018 04/10/2017 04/11/2016  Falls in the past year? 0 0 No No No  Number falls in past yr: 0 0 - - -  Injury with Fall? 0 0 - - -  Follow up Falls prevention discussed - - - -   FALL RISK PREVENTION PERTAINING TO THE HOME:  Any stairs in or around the home? Yes  If so, do they handrails? Yes   Home  free of loose throw rugs in walkways, pet beds, electrical cords, etc? Yes  Adequate lighting in your home to reduce risk of falls? Yes   ASSISTIVE DEVICES UTILIZED TO PREVENT FALLS:  Life alert? No  Use of a cane, walker or w/c? No  Grab bars in the bathroom? No  Shower chair or bench in shower? Yes  Elevated toilet seat or a handicapped toilet? No   DME ORDERS:  DME order needed?  No   TIMED UP AND GO:  Was the test performed? Yes .  Length of time to ambulate 10 feet: 5 sec.   GAIT:  Appearance of gait: Gait stead-fast and without the use of an assistive device.    Education: Fall risk prevention has been discussed.  Intervention(s) required? No    Depression Screen PHQ 2/9 Scores 01/21/2019 10/25/2018 04/24/2018 10/23/2017  PHQ - 2 Score 1 1 0 0  PHQ- 9 Score 3 3 0 0     Cognitive Function     6CIT Screen 01/21/2019 04/10/2017 04/11/2016  What Year? 0 points 0 points 0 points  What month? 0 points 0 points 0 points  What time? 0 points 0 points 0 points  Count back from 20 0 points 0 points 0 points  Months in reverse 0 points 0 points 0 points  Repeat phrase 0 points 2 points 0 points  Total Score 0 2 0    Immunization History  Administered Date(s) Administered  . Influenza, High Dose Seasonal PF 04/10/2017  . Influenza,inj,Quad PF,6+ Mos 04/11/2016  . Influenza,inj,quad, With Preservative 05/18/2018  . Influenza-Unspecified 08/01/2011, 04/02/2014  . Pneumococcal Conjugate-13 11/27/2014  . Pneumococcal Polysaccharide-23 04/11/2016  . Tdap 08/14/2013  . Zoster 08/14/2013    Qualifies for Shingles Vaccine? Yes  Zostavax completed 2015. Due for Shingrix. Education has been provided regarding the importance of this vaccine. Pt has been advised to  call insurance company to determine out of pocket expense. Advised may also receive vaccine at local pharmacy or Health Dept. Verbalized acceptance and understanding.  Tdap: Up to date  Flu Vaccine: Up to date   Pneumococcal Vaccine: Up to date   Screening Tests Health Maintenance  Topic Date Due  . INFLUENZA VACCINE  02/16/2019  . MAMMOGRAM  08/15/2019  . COLONOSCOPY  07/21/2022  . TETANUS/TDAP  08/15/2023  . DEXA SCAN  Completed  . PNA vac Low Risk Adult  Completed  . Hepatitis C Screening  Addressed    Cancer Screenings:  Colorectal Screening: Completed 07/21/17. Repeat every 5 years  Mammogram: Completed 08/14/18. Repeat every year  Bone Density: Completed 05/25/17. Results reflect NORMAL. Repeat every 2 years.    Lung Cancer Screening: (Low Dose CT Chest recommended if Age 20-80 years, 30 pack-year currently smoking OR have quit w/in 15years.) does not qualify.   Additional Screening:  Hepatitis C Screening: does qualify; Completed 04/11/16  Vision Screening: Recommended annual ophthalmology exams for early detection of glaucoma and other disorders of the eye. Is the patient up to date with their annual eye exam?  Yes  Who is the provider or what is the name of the office in which the pt attends annual eye exams? [facility] Screening: Recommended annual dental exams for proper oral hygiene  Community Resource Referral:  CRR required this visit?  No      Plan:    I have personally reviewed and addressed the Medicare Annual Wellness questionnaire and have noted the following in the patient's chart:  A. Medical and social history B. Use of alcohol, tobacco or illicit drugs  C. Current medications and supplements D. Functional ability and status E.  Nutritional status F.  Physical activity G. Advance directives H. List of other physicians I.  Hospitalizations, surgeries, and ER visits in previous 12 months J.  [place] such as hearing and vision if needed, cognitive and depression L. Referrals and appointments   In addition, I have reviewed and discussed with patient certain preventive protocols, quality metrics, and best practice  recommendations. A written personalized care plan for preventive services as well as general preventive health recommendations were provided to patient.   Signed,  Clemetine Marker, LPN Nurse Health Advisor   Nurse Notes: pt doing well and appreciative of visit today.

## 2019-01-21 NOTE — Patient Instructions (Signed)
Deanna Johnson , Thank you for taking time to come for your Medicare Wellness Visit. I appreciate your ongoing commitment to your health goals. Please review the following plan we discussed and let me know if I can assist you in the future.   Screening recommendations/referrals: Colonoscopy: done 07/21/17. Repeat in 2024. Mammogram: done 08/14/18. Bone Density: done 05/25/17. Recommended yearly ophthalmology/optometry visit for glaucoma screening and checkup Recommended yearly dental visit for hygiene and checkup  Vaccinations: Influenza vaccine: done 05/18/18 Pneumococcal vaccine: done 04/11/16 Tdap vaccine: done 08/14/13 Shingles vaccine: Shingrix discussed. Please contact your pharmacy for coverage information.   Advanced directives: Please bring a copy of your health care power of attorney and living will to the office at your convenience.  Conditions/risks identified: recommend increasing physical activity to at least 3 days per week  Next appointment: Please follow up in one year for your Medicare Annual Wellness visit.     Preventive Care 27 Years and Older, Female Preventive care refers to lifestyle choices and visits with your health care provider that can promote health and wellness. What does preventive care include?  A yearly physical exam. This is also called an annual well check.  Dental exams once or twice a year.  Routine eye exams. Ask your health care provider how often you should have your eyes checked.  Personal lifestyle choices, including:  Daily care of your teeth and gums.  Regular physical activity.  Eating a healthy diet.  Avoiding tobacco and drug use.  Limiting alcohol use.  Practicing safe sex.  Taking low-dose aspirin every day.  Taking vitamin and mineral supplements as recommended by your health care provider. What happens during an annual well check? The services and screenings done by your health care provider during your annual well check  will depend on your age, overall health, lifestyle risk factors, and family history of disease. Counseling  Your health care provider may ask you questions about your:  Alcohol use.  Tobacco use.  Drug use.  Emotional well-being.  Home and relationship well-being.  Sexual activity.  Eating habits.  History of falls.  Memory and ability to understand (cognition).  Work and work Statistician.  Reproductive health. Screening  You may have the following tests or measurements:  Height, weight, and BMI.  Blood pressure.  Lipid and cholesterol levels. These may be checked every 5 years, or more frequently if you are over 37 years old.  Skin check.  Lung cancer screening. You may have this screening every year starting at age 17 if you have a 30-pack-year history of smoking and currently smoke or have quit within the past 15 years.  Fecal occult blood test (FOBT) of the stool. You may have this test every year starting at age 24.  Flexible sigmoidoscopy or colonoscopy. You may have a sigmoidoscopy every 5 years or a colonoscopy every 10 years starting at age 39.  Hepatitis C blood test.  Hepatitis B blood test.  Sexually transmitted disease (STD) testing.  Diabetes screening. This is done by checking your blood sugar (glucose) after you have not eaten for a while (fasting). You may have this done every 1-3 years.  Bone density scan. This is done to screen for osteoporosis. You may have this done starting at age 75.  Mammogram. This may be done every 1-2 years. Talk to your health care provider about how often you should have regular mammograms. Talk with your health care provider about your test results, treatment options, and if necessary, the need  for more tests. Vaccines  Your health care provider may recommend certain vaccines, such as:  Influenza vaccine. This is recommended every year.  Tetanus, diphtheria, and acellular pertussis (Tdap, Td) vaccine. You may  need a Td booster every 10 years.  Zoster vaccine. You may need this after age 74.  Pneumococcal 13-valent conjugate (PCV13) vaccine. One dose is recommended after age 75.  Pneumococcal polysaccharide (PPSV23) vaccine. One dose is recommended after age 35. Talk to your health care provider about which screenings and vaccines you need and how often you need them. This information is not intended to replace advice given to you by your health care provider. Make sure you discuss any questions you have with your health care provider. Document Released: 07/31/2015 Document Revised: 03/23/2016 Document Reviewed: 05/05/2015 Elsevier Interactive Patient Education  2017 [place] Prevention in the Home Falls can cause injuries. They can happen to people of all ages. There are many things you can do to make your home safe and to help prevent falls. What can I do on the outside of my home?  Regularly fix the edges of walkways and driveways and fix any cracks.  Remove anything that might make you trip as you walk through a door, such as a raised step or threshold.  Trim any bushes or trees on the path to your home.  Use bright outdoor lighting.  Clear any walking paths of anything that might make someone trip, such as rocks or tools.  Regularly check to see if handrails are loose or broken. Make sure that both sides of any steps have handrails.  Any raised decks and porches should have guardrails on the edges.  Have any leaves, snow, or ice cleared regularly.  Use sand or salt on walking paths during winter.  Clean up any spills in your garage right away. This includes oil or grease spills. What can I do in the bathroom?  Use night lights.  Install grab bars by the toilet and in the tub and shower. Do not use towel bars as grab bars.  Use non-skid mats or decals in the tub or shower.  If you need to sit down in the shower, use a plastic, non-slip stool.  Keep the floor  dry. Clean up any water that spills on the floor as soon as it happens.  Remove soap buildup in the tub or shower regularly.  Attach bath mats securely with double-sided non-slip rug tape.  Do not have throw rugs and other things on the floor that can make you trip. What can I do in the bedroom?  Use night lights.  Make sure that you have a light by your bed that is easy to reach.  Do not use any sheets or blankets that are too big for your bed. They should not hang down onto the floor.  Have a firm chair that has side arms. You can use this for support while you get dressed.  Do not have throw rugs and other things on the floor that can make you trip. What can I do in the kitchen?  Clean up any spills right away.  Avoid walking on wet floors.  Keep items that you use a lot in easy-to-reach places.  If you need to reach something above you, use a strong step stool that has a grab bar.  Keep electrical cords out of the way.  Do not use floor polish or wax that makes floors slippery. If you must use wax, use  non-skid floor wax.  Do not have throw rugs and other things on the floor that can make you trip. What can I do with my stairs?  Do not leave any items on the stairs.  Make sure that there are handrails on both sides of the stairs and use them. Fix handrails that are broken or loose. Make sure that handrails are as long as the stairways.  Check any carpeting to make sure that it is firmly attached to the stairs. Fix any carpet that is loose or worn.  Avoid having throw rugs at the top or bottom of the stairs. If you do have throw rugs, attach them to the floor with carpet tape.  Make sure that you have a light switch at the top of the stairs and the bottom of the stairs. If you do not have them, ask someone to add them for you. What else can I do to help prevent falls?  Wear shoes that:  Do not have high heels.  Have rubber bottoms.  Are comfortable and fit you  well.  Are closed at the toe. Do not wear sandals.  If you use a stepladder:  Make sure that it is fully opened. Do not climb a closed stepladder.  Make sure that both sides of the stepladder are locked into place.  Ask someone to hold it for you, if possible.  Clearly mark and make sure that you can see:  Any grab bars or handrails.  First and last steps.  Where the edge of each step is.  Use tools that help you move around (mobility aids) if they are needed. These include:  Canes.  Walkers.  Scooters.  Crutches.  Turn on the lights when you go into a dark area. Replace any light bulbs as soon as they burn out.  Set up your furniture so you have a clear path. Avoid moving your furniture around.  If any of your floors are uneven, fix them.  If there are any pets around you, be aware of where they are.  Review your medicines with your doctor. Some medicines can make you feel dizzy. This can increase your chance of falling. Ask your doctor what other things that you can do to help prevent falls. This information is not intended to replace advice given to you by your health care provider. Make sure you discuss any questions you have with your health care provider. Document Released: 04/30/2009 Document Revised: 12/10/2015 Document Reviewed: 08/08/2014 Elsevier Interactive Patient Education  2017 [employer].

## 2019-01-22 DIAGNOSIS — M436 Torticollis: Secondary | ICD-10-CM | POA: Diagnosis not present

## 2019-01-22 DIAGNOSIS — M542 Cervicalgia: Secondary | ICD-10-CM | POA: Diagnosis not present

## 2019-01-22 DIAGNOSIS — M5412 Radiculopathy, cervical region: Secondary | ICD-10-CM | POA: Diagnosis not present

## 2019-01-22 DIAGNOSIS — M6281 Muscle weakness (generalized): Secondary | ICD-10-CM | POA: Diagnosis not present

## 2019-01-22 DIAGNOSIS — G8929 Other chronic pain: Secondary | ICD-10-CM | POA: Diagnosis not present

## 2019-01-24 NOTE — Discharge Instructions (Signed)

## 2019-01-29 ENCOUNTER — Other Ambulatory Visit
Admission: RE | Admit: 2019-01-29 | Discharge: 2019-01-29 | Disposition: A | Discharge status: Home / Self Care | Payer: Medicare HMO | Source: Ambulatory Visit | Attending: Ophthalmology | Admitting: Ophthalmology

## 2019-01-29 ENCOUNTER — Other Ambulatory Visit: Payer: Self-pay

## 2019-01-29 DIAGNOSIS — Z1159 Encounter for screening for other viral diseases: Secondary | ICD-10-CM | POA: Diagnosis not present

## 2019-01-29 DIAGNOSIS — G8929 Other chronic pain: Secondary | ICD-10-CM | POA: Diagnosis not present

## 2019-01-29 DIAGNOSIS — M542 Cervicalgia: Secondary | ICD-10-CM | POA: Diagnosis not present

## 2019-01-30 LAB — SARS CORONAVIRUS 2 (TAT 6-24 HRS): SARS Coronavirus 2: NEGATIVE

## 2019-02-01 ENCOUNTER — Encounter
Admission: RE | Disposition: A | Discharge status: Home / Self Care | Payer: Self-pay | Source: Home / Self Care | Attending: Ophthalmology

## 2019-02-01 ENCOUNTER — Ambulatory Visit: Payer: Medicare HMO | Admitting: Anesthesiology

## 2019-02-01 ENCOUNTER — Ambulatory Visit
Admission: RE | Admit: 2019-02-01 | Discharge: 2019-02-01 | Disposition: A | Discharge status: Home / Self Care | Payer: Medicare HMO | Attending: Ophthalmology | Admitting: Ophthalmology

## 2019-02-01 DIAGNOSIS — F329 Major depressive disorder, single episode, unspecified: Secondary | ICD-10-CM | POA: Diagnosis not present

## 2019-02-01 DIAGNOSIS — Z79899 Other long term (current) drug therapy: Secondary | ICD-10-CM | POA: Insufficient documentation

## 2019-02-01 DIAGNOSIS — H2511 Age-related nuclear cataract, right eye: Secondary | ICD-10-CM | POA: Diagnosis not present

## 2019-02-01 DIAGNOSIS — E039 Hypothyroidism, unspecified: Secondary | ICD-10-CM | POA: Diagnosis not present

## 2019-02-01 DIAGNOSIS — I10 Essential (primary) hypertension: Secondary | ICD-10-CM | POA: Insufficient documentation

## 2019-02-01 DIAGNOSIS — E063 Autoimmune thyroiditis: Secondary | ICD-10-CM | POA: Diagnosis not present

## 2019-02-01 DIAGNOSIS — H25811 Combined forms of age-related cataract, right eye: Secondary | ICD-10-CM | POA: Diagnosis not present

## 2019-02-01 DIAGNOSIS — Z79890 Hormone replacement therapy (postmenopausal): Secondary | ICD-10-CM | POA: Diagnosis not present

## 2019-02-01 HISTORY — PX: CATARACT EXTRACTION W/PHACO: SHX586

## 2019-02-01 SURGERY — PHACOEMULSIFICATION, CATARACT, WITH IOL INSERTION
Anesthesia: Monitor Anesthesia Care | Site: Eye | Laterality: Right

## 2019-02-01 MED ORDER — ARMC OPHTHALMIC DILATING DROPS
1.0000 "application " | OPHTHALMIC | Status: DC | PRN
  Administered 2019-02-01 (×3): 1 via OPHTHALMIC

## 2019-02-01 MED ORDER — FENTANYL CITRATE (PF) 100 MCG/2ML IJ SOLN
INTRAMUSCULAR | Status: DC | PRN
  Administered 2019-02-01 (×2): 50 ug via INTRAVENOUS

## 2019-02-01 MED ORDER — EPINEPHRINE PF 1 MG/ML IJ SOLN
INTRAOCULAR | Status: DC | PRN
  Administered 2019-02-01: 85 mL via OPHTHALMIC

## 2019-02-01 MED ORDER — TETRACAINE HCL 0.5 % OP SOLN
1.0000 [drp] | OPHTHALMIC | Status: DC | PRN
  Administered 2019-02-01 (×3): 1 [drp] via OPHTHALMIC

## 2019-02-01 MED ORDER — SODIUM HYALURONATE 10 MG/ML IO SOLN
INTRAOCULAR | Status: DC | PRN
  Administered 2019-02-01: 0.55 mL via INTRAOCULAR

## 2019-02-01 MED ORDER — MOXIFLOXACIN HCL 0.5 % OP SOLN
OPHTHALMIC | Status: DC | PRN
  Administered 2019-02-01: 0.2 mL via OPHTHALMIC

## 2019-02-01 MED ORDER — MIDAZOLAM HCL 2 MG/2ML IJ SOLN
INTRAMUSCULAR | Status: DC | PRN
  Administered 2019-02-01: 2 mg via INTRAVENOUS

## 2019-02-01 MED ORDER — SODIUM HYALURONATE 23 MG/ML IO SOLN
INTRAOCULAR | Status: DC | PRN
  Administered 2019-02-01: 0.6 mL via INTRAOCULAR

## 2019-02-01 MED ORDER — LIDOCAINE HCL (PF) 2 % IJ SOLN
INTRAOCULAR | Status: DC | PRN
  Administered 2019-02-01: 09:00:00 1 mL via INTRAOCULAR

## 2019-02-01 SURGICAL SUPPLY — 19 items
CANNULA ANT/CHMB 27G (MISCELLANEOUS) ×2 IMPLANT
CANNULA ANT/CHMB 27GA (MISCELLANEOUS) ×6 IMPLANT
DISSECTOR HYDRO NUCLEUS 50X22 (MISCELLANEOUS) ×3 IMPLANT
GLOVE SURG LX 7.5 STRW (GLOVE) ×2
GLOVE SURG LX STRL 7.5 STRW (GLOVE) ×1 IMPLANT
GLOVE SURG SYN 8.5  E (GLOVE) ×2
GLOVE SURG SYN 8.5 E (GLOVE) ×1 IMPLANT
GLOVE SURG SYN 8.5 PF PI (GLOVE) ×1 IMPLANT
GOWN STRL REUS W/ TWL LRG LVL3 (GOWN DISPOSABLE) ×2 IMPLANT
GOWN STRL REUS W/TWL LRG LVL3 (GOWN DISPOSABLE) ×4
LENS IOL TECNIS ITEC 15.5 (Intraocular Lens) ×2 IMPLANT
MARKER SKIN DUAL TIP RULER LAB (MISCELLANEOUS) ×3 IMPLANT
PACK DR. KING ARMS (PACKS) ×3 IMPLANT
PACK EYE AFTER SURG (MISCELLANEOUS) ×3 IMPLANT
PACK OPTHALMIC (MISCELLANEOUS) ×3 IMPLANT
SYR 3ML LL SCALE MARK (SYRINGE) ×3 IMPLANT
SYR TB 1ML LUER SLIP (SYRINGE) ×3 IMPLANT
WATER STERILE IRR 250ML POUR (IV SOLUTION) ×3 IMPLANT
WIPE NON LINTING 3.25X3.25 (MISCELLANEOUS) ×3 IMPLANT

## 2019-02-01 NOTE — Anesthesia Procedure Notes (Signed)
Procedure Name: MAC Performed by: Rainelle Sulewski M, CRNA Pre-anesthesia Checklist: Timeout performed, Patient being monitored, Suction available, Emergency Drugs available and Patient identified Patient Re-evaluated:Patient Re-evaluated prior to induction Oxygen Delivery Method: Nasal cannula       

## 2019-02-01 NOTE — H&P (Signed)

## 2019-02-01 NOTE — Anesthesia Postprocedure Evaluation (Signed)
Anesthesia Post Note  Patient: MAKAYELA SECREST  Procedure(s) Performed: CATARACT EXTRACTION PHACO AND INTRAOCULAR LENS PLACEMENT ([place])  RIGHT (Right Eye)  Patient location during evaluation: PACU Anesthesia Type: MAC Level of consciousness: awake and alert Pain management: pain level controlled Vital Signs Assessment: post-procedure vital signs reviewed and stable Respiratory status: spontaneous breathing, nonlabored ventilation, respiratory function stable and patient connected to nasal cannula oxygen Cardiovascular status: stable and blood pressure returned to baseline Postop Assessment: no apparent nausea or vomiting Anesthetic complications: no    Trecia Rogers

## 2019-02-01 NOTE — Op Note (Signed)
OPERATIVE NOTE  Deanna Johnson [id number] 02/01/2019   PREOPERATIVE DIAGNOSIS:  Nuclear sclerotic cataract right eye.  H25.11   POSTOPERATIVE DIAGNOSIS:    Nuclear sclerotic cataract right eye.     PROCEDURE:  Phacoemusification with posterior chamber intraocular lens placement of the right eye   LENS:   Implant Name Type Inv. Item Serial No. Manufacturer Lot No. LRB No. Used Action  LENS IOL DIOP 15.5 - [id number] Intraocular Lens LENS IOL DIOP 15.5 [id number] AMO  Right 1 Implanted       PCB00 +15.5   ULTRASOUND TIME: 1 minutes 23 seconds.  CDE 13.08   SURGEON:  Benay Pillow, MD, MPH  ANESTHESIOLOGIST: Anesthesiologist: Rochel Brome, MD CRNA: Izetta Dakin, CRNA   ANESTHESIA:  Topical with tetracaine drops augmented with 1% preservative-free intracameral lidocaine.  ESTIMATED BLOOD LOSS: less than 1 mL.   COMPLICATIONS:  None.   DESCRIPTION OF PROCEDURE:  The patient was identified in the holding room and transported to the operating room and placed in the supine position under the operating microscope.  The right eye was identified as the operative eye and it was prepped and draped in the usual sterile ophthalmic fashion.   A 1.0 millimeter clear-corneal paracentesis was made at the 10:30 position. 0.5 ml of preservative-free 1% lidocaine with epinephrine was injected into the anterior chamber.  The anterior chamber was filled with Healon 5 viscoelastic.  A 2.4 millimeter keratome was used to make a near-clear corneal incision at the 8:00 position.  A curvilinear capsulorrhexis was made with a cystotome and capsulorrhexis forceps.  Balanced salt solution was used to hydrodissect and hydrodelineate the nucleus.   Phacoemulsification was then used in stop and chop fashion to remove the lens nucleus and epinucleus.  The remaining cortex was then removed using the irrigation and aspiration handpiece. Healon was then placed into the capsular bag to distend it for lens  placement.  A lens was then injected into the capsular bag.  The remaining viscoelastic was aspirated.   Wounds were hydrated with balanced salt solution.  The anterior chamber was inflated to a physiologic pressure with balanced salt solution.   Intracameral vigamox 0.1 mL undiluted was injected into the eye and a drop placed onto the ocular surface.  No wound leaks were noted.  The patient was taken to the recovery room in stable condition without complications of anesthesia or surgery  Benay Pillow 02/01/2019, 8:42 AM

## 2019-02-01 NOTE — Transfer of Care (Signed)
Immediate Anesthesia Transfer of Care Note  Patient: Deanna Johnson  Procedure(s) Performed: CATARACT EXTRACTION PHACO AND INTRAOCULAR LENS PLACEMENT (IOC)  RIGHT (Right Eye)  Patient Location: PACU  Anesthesia Type: MAC  Level of Consciousness: awake, alert  and patient cooperative  Airway and Oxygen Therapy: Patient Spontanous Breathing and Patient connected to supplemental oxygen  Post-op Assessment: Post-op Vital signs reviewed, Patient's Cardiovascular Status Stable, Respiratory Function Stable, Patent Airway and No signs of Nausea or vomiting  Post-op Vital Signs: Reviewed and stable  Complications: No apparent anesthesia complications

## 2019-02-01 NOTE — Anesthesia Preprocedure Evaluation (Addendum)
Anesthesia Evaluation  Patient identified by MRN, date of birth, ID band Patient awake    Reviewed: Allergy & Precautions, H&P , NPO status , Patient's Chart, lab work & pertinent test results, reviewed documented beta blocker date and time   Airway Mallampati: II  TM Distance: >3 FB Neck ROM: full    Dental no notable dental hx.    Pulmonary former smoker,    Pulmonary exam normal breath sounds clear to auscultation       Cardiovascular Exercise Tolerance: Good hypertension, Normal cardiovascular exam Rhythm:regular Rate:Normal     Neuro/Psych PSYCHIATRIC DISORDERS Depression negative neurological ROS     GI/Hepatic negative GI ROS, Neg liver ROS,   Endo/Other  Hypothyroidism   Renal/GU negative Renal ROS  negative genitourinary   Musculoskeletal   Abdominal   Peds  Hematology negative hematology ROS (+)   Anesthesia Other Findings   Reproductive/Obstetrics negative OB ROS                            Anesthesia Physical Anesthesia Plan  ASA: II  Anesthesia Plan: MAC   Post-op Pain Management:    Induction:   PONV Risk Score and Plan:   Airway Management Planned:   Additional Equipment:   Intra-op Plan:   Post-operative Plan:   Informed Consent: I have reviewed the patients History and Physical, chart, labs and discussed the procedure including the risks, benefits and alternatives for the proposed anesthesia with the patient or authorized representative who has indicated his/her understanding and acceptance.     Dental Advisory Given  Plan Discussed with: CRNA  Anesthesia Plan Comments:         Anesthesia Quick Evaluation

## 2019-02-11 DIAGNOSIS — G8929 Other chronic pain: Secondary | ICD-10-CM | POA: Diagnosis not present

## 2019-02-11 DIAGNOSIS — M5412 Radiculopathy, cervical region: Secondary | ICD-10-CM | POA: Diagnosis not present

## 2019-02-11 DIAGNOSIS — M6281 Muscle weakness (generalized): Secondary | ICD-10-CM | POA: Diagnosis not present

## 2019-02-11 DIAGNOSIS — M542 Cervicalgia: Secondary | ICD-10-CM | POA: Diagnosis not present

## 2019-02-11 DIAGNOSIS — M436 Torticollis: Secondary | ICD-10-CM | POA: Diagnosis not present

## 2019-02-13 DIAGNOSIS — M5412 Radiculopathy, cervical region: Secondary | ICD-10-CM | POA: Diagnosis not present

## 2019-02-13 DIAGNOSIS — M542 Cervicalgia: Secondary | ICD-10-CM | POA: Diagnosis not present

## 2019-02-13 DIAGNOSIS — M436 Torticollis: Secondary | ICD-10-CM | POA: Diagnosis not present

## 2019-02-13 DIAGNOSIS — M6281 Muscle weakness (generalized): Secondary | ICD-10-CM | POA: Diagnosis not present

## 2019-02-13 DIAGNOSIS — G8929 Other chronic pain: Secondary | ICD-10-CM | POA: Diagnosis not present

## 2019-02-18 DIAGNOSIS — M436 Torticollis: Secondary | ICD-10-CM | POA: Diagnosis not present

## 2019-02-18 DIAGNOSIS — M6281 Muscle weakness (generalized): Secondary | ICD-10-CM | POA: Diagnosis not present

## 2019-02-18 DIAGNOSIS — M542 Cervicalgia: Secondary | ICD-10-CM | POA: Diagnosis not present

## 2019-02-18 DIAGNOSIS — G8929 Other chronic pain: Secondary | ICD-10-CM | POA: Diagnosis not present

## 2019-02-18 DIAGNOSIS — M5412 Radiculopathy, cervical region: Secondary | ICD-10-CM | POA: Diagnosis not present

## 2019-02-25 DIAGNOSIS — M6281 Muscle weakness (generalized): Secondary | ICD-10-CM | POA: Diagnosis not present

## 2019-02-25 DIAGNOSIS — M542 Cervicalgia: Secondary | ICD-10-CM | POA: Diagnosis not present

## 2019-02-25 DIAGNOSIS — G8929 Other chronic pain: Secondary | ICD-10-CM | POA: Diagnosis not present

## 2019-02-25 DIAGNOSIS — M436 Torticollis: Secondary | ICD-10-CM | POA: Diagnosis not present

## 2019-02-25 DIAGNOSIS — M5412 Radiculopathy, cervical region: Secondary | ICD-10-CM | POA: Diagnosis not present

## 2019-02-27 DIAGNOSIS — M436 Torticollis: Secondary | ICD-10-CM | POA: Diagnosis not present

## 2019-02-27 DIAGNOSIS — M5412 Radiculopathy, cervical region: Secondary | ICD-10-CM | POA: Diagnosis not present

## 2019-02-27 DIAGNOSIS — M6281 Muscle weakness (generalized): Secondary | ICD-10-CM | POA: Diagnosis not present

## 2019-02-27 DIAGNOSIS — M542 Cervicalgia: Secondary | ICD-10-CM | POA: Diagnosis not present

## 2019-02-27 DIAGNOSIS — G8929 Other chronic pain: Secondary | ICD-10-CM | POA: Diagnosis not present

## 2019-03-05 ENCOUNTER — Other Ambulatory Visit: Payer: Self-pay | Admitting: Internal Medicine

## 2019-03-06 DIAGNOSIS — M436 Torticollis: Secondary | ICD-10-CM | POA: Diagnosis not present

## 2019-03-06 DIAGNOSIS — G8929 Other chronic pain: Secondary | ICD-10-CM | POA: Diagnosis not present

## 2019-03-06 DIAGNOSIS — M6281 Muscle weakness (generalized): Secondary | ICD-10-CM | POA: Diagnosis not present

## 2019-03-06 DIAGNOSIS — M5412 Radiculopathy, cervical region: Secondary | ICD-10-CM | POA: Diagnosis not present

## 2019-03-06 DIAGNOSIS — M542 Cervicalgia: Secondary | ICD-10-CM | POA: Diagnosis not present

## 2019-03-08 DIAGNOSIS — Z961 Presence of intraocular lens: Secondary | ICD-10-CM | POA: Diagnosis not present

## 2019-03-18 DIAGNOSIS — G8929 Other chronic pain: Secondary | ICD-10-CM | POA: Diagnosis not present

## 2019-03-18 DIAGNOSIS — M5412 Radiculopathy, cervical region: Secondary | ICD-10-CM | POA: Diagnosis not present

## 2019-03-18 DIAGNOSIS — M6281 Muscle weakness (generalized): Secondary | ICD-10-CM | POA: Diagnosis not present

## 2019-03-18 DIAGNOSIS — M542 Cervicalgia: Secondary | ICD-10-CM | POA: Diagnosis not present

## 2019-03-18 DIAGNOSIS — M436 Torticollis: Secondary | ICD-10-CM | POA: Diagnosis not present

## 2019-04-15 ENCOUNTER — Other Ambulatory Visit: Payer: Self-pay | Admitting: Internal Medicine

## 2019-05-02 ENCOUNTER — Encounter: Payer: Self-pay | Admitting: Internal Medicine

## 2019-05-02 ENCOUNTER — Ambulatory Visit (INDEPENDENT_AMBULATORY_CARE_PROVIDER_SITE_OTHER): Payer: Medicare HMO | Admitting: Internal Medicine

## 2019-05-02 ENCOUNTER — Other Ambulatory Visit: Payer: Self-pay

## 2019-05-02 VITALS — BP 124/70 | HR 77 | Ht 61.0 in | Wt 223.0 lb

## 2019-05-02 DIAGNOSIS — Z6841 Body Mass Index (BMI) 40.0 and over, adult: Secondary | ICD-10-CM

## 2019-05-02 DIAGNOSIS — E038 Other specified hypothyroidism: Secondary | ICD-10-CM

## 2019-05-02 DIAGNOSIS — E063 Autoimmune thyroiditis: Secondary | ICD-10-CM

## 2019-05-02 DIAGNOSIS — F3341 Major depressive disorder, recurrent, in partial remission: Secondary | ICD-10-CM

## 2019-05-02 DIAGNOSIS — M5412 Radiculopathy, cervical region: Secondary | ICD-10-CM

## 2019-05-02 DIAGNOSIS — I10 Essential (primary) hypertension: Secondary | ICD-10-CM | POA: Diagnosis not present

## 2019-05-02 MED ORDER — FLUOXETINE HCL 20 MG PO CAPS: 20.0000 mg | ORAL_CAPSULE | Freq: Every day | ORAL | 12 refills | Status: DC

## 2019-05-02 MED ORDER — FLUTICASONE PROPIONATE 50 MCG/ACT NA SUSP: 2.0000 | Freq: Every day | NASAL | 5 refills | Status: DC

## 2019-05-02 MED ORDER — HYDROCHLOROTHIAZIDE 25 MG PO TABS: 25.0000 mg | ORAL_TABLET | Freq: Every day | ORAL | 12 refills | Status: DC

## 2019-05-02 MED ORDER — LISINOPRIL 20 MG PO TABS: 20.0000 mg | ORAL_TABLET | Freq: Every day | ORAL | 12 refills | Status: DC

## 2019-05-02 NOTE — Progress Notes (Signed)
Date:  05/02/2019   Name:  Deanna Johnson   DOB:  1951/02/20   MRN:  [id number]   Chief Complaint: Hypertension (Patient is fasting today. ) and Hypothyroidism Depression - She is doing very well.  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 She is taking Prozac daily and denies SI or HI.  No change in sleep or appetite.  Energy level is good.  No side effects to medication. Hypertension This is a chronic problem. The problem is controlled. Associated symptoms include neck pain. Pertinent negatives include no chest pain, palpitations or shortness of breath. Past treatments include ACE inhibitors and diuretics. The current treatment provides significant improvement. Identifiable causes of hypertension include a thyroid problem.  Thyroid Problem Presents for follow-up visit. Symptoms include fatigue. Patient reports no anxiety, diaphoresis, palpitations or tremors. The symptoms have been stable.  Neck Pain  This is a chronic problem. The problem has been gradually improving. The pain is present in the left side. The quality of the pain is described as aching and burning. Associated symptoms include numbness (and pain on left side of neck to shoulder). Pertinent negatives include no chest pain, fever, trouble swallowing or weakness. She has tried NSAIDs (and PTx - now doing home traction) for the symptoms. The treatment provided moderate relief.   Lab Results  Component Value Date   CREATININE 0.80 10/25/2018   BUN 17 10/25/2018   NA 140 10/25/2018   K 4.4 10/25/2018   CL 100 10/25/2018   CO2 24 10/25/2018   Lab Results  Component Value Date   TSH 0.022 (L) 10/25/2018     Review of Systems  Constitutional: Positive for fatigue. Negative for chills, diaphoresis, fever and unexpected weight change.  HENT: Negative for trouble swallowing.   Eyes: Negative for visual disturbance.  Respiratory: Negative for cough, chest tightness,  shortness of breath and wheezing.   Cardiovascular: Negative for chest pain, palpitations and leg swelling.  Musculoskeletal: Positive for back pain and neck pain.  Neurological: Positive for numbness (and pain on left side of neck to shoulder). Negative for dizziness, tremors and weakness.  Psychiatric/Behavioral: Negative for dysphoric mood and sleep disturbance. The patient is not nervous/anxious.     Patient Active Problem List   Diagnosis Date Noted  . Cervical radiculopathy 10/25/2018  . Morbid obesity with BMI of 40.0-44.9, adult ([place]) 06/27/2018  . Hyperlipidemia, mixed 05/05/2017  . Hypothyroidism due to Hashimoto's thyroiditis 05/05/2017  . Recurrent major depression in partial remission ([place]) 04/11/2016  . Hx of colonic polyp 04/11/2016  . Arthritis 12/31/2014  . Essential hypertension 12/31/2014  . Impingement syndrome of left shoulder 12/16/2014  . H/O fibromyalgia 08/18/2012    No Known Allergies  Past Surgical History:  Procedure Laterality Date  . BREAST BIOPSY Right 1987   neg  . BREAST SURGERY     RIGHT BREAST BIOPSY  . BUNIONECTOMY    . CATARACT EXTRACTION W/PHACO Left 01/04/2019   Procedure: CATARACT EXTRACTION PHACO AND INTRAOCULAR LENS PLACEMENT ([place])  LEFT;  Surgeon: Eulogio Bear, MD;  Location: [place];  Service: Ophthalmology;  Laterality: Left;  . CATARACT EXTRACTION W/PHACO Right 02/01/2019   Procedure: CATARACT EXTRACTION PHACO AND INTRAOCULAR LENS PLACEMENT ([place])  RIGHT;  Surgeon: Eulogio Bear, MD;  Location: [place];  Service: Ophthalmology;  Laterality: Right;  . COLONOSCOPY  2011   benign polyps - f/u 2016  . COLONOSCOPY WITH PROPOFOL N/A 07/21/2017  Procedure: COLONOSCOPY WITH PROPOFOL;  Surgeon: Lollie Sails, MD;  [facility] ENDOSCOPY;  Service: Endoscopy;  Laterality: N/A;  . FRACTURE SURGERY     MAXILLARY FACIAL SURGERY  . MANDIBLE FRACTURE SURGERY     jaw resection  . shoulder arthroscopy Right    . SHOULDER ARTHROSCOPY WITH OPEN ROTATOR CUFF REPAIR Left 01/30/2015   Procedure: SHOULDER ARTHROSCOPY WITH POSS OPEN ROTATOR CUFF REPAIR;  Surgeon: Leanor Kail, MD;  Location: [place];  Service: Orthopedics;  Laterality: Left;  Release of long head of biceps tendon Subacromial decompression Mini open rotator cuff repair  . TONSILLECTOMY    . TOTAL ABDOMINAL HYSTERECTOMY    . TUBAL LIGATION  1978    Social History   Tobacco Use  . Smoking status: Former Smoker    Packs/day: 0.25    Years: 2.00    Pack years: 0.50    Types: Cigarettes    Quit date: 1972    Years since quitting: 48.8  . Smokeless tobacco: Never Used  . Tobacco comment: quit at age 22  Substance Use Topics  . Alcohol use: No    Alcohol/week: 0.0 standard drinks  . Drug use: No     Medication list has been reviewed and updated.  Current Meds  Medication Sig  . celecoxib (CELEBREX) 100 MG capsule Take 100 mg by mouth daily.  . Cholecalciferol 2000 units TABS Take 6,000 Units by mouth daily.   Marland Kitchen FLUoxetine (PROZAC) 20 MG capsule Take 1 capsule (20 mg total) by mouth daily.  . fluticasone (FLONASE) 50 MCG/ACT nasal spray Place 2 sprays into both nostrils daily.  . hydrochlorothiazide (HYDRODIURIL) 25 MG tablet Take 1 tablet (25 mg total) by mouth daily.  Marland Kitchen lisinopril (ZESTRIL) 20 MG tablet Take 1 tablet (20 mg total) by mouth daily.  . Multiple Vitamins-Minerals (CENTRUM SILVER PO) Take by mouth.  . NONFORMULARY OR COMPOUNDED ITEM 152 mcg T4/ 36 mcg T3  . Probiotic Product (ACIDOPHILUS PROBIOTIC BLEND PO) Take by mouth.  . [DISCONTINUED] FLUoxetine (PROZAC) 20 MG capsule Take 1 capsule (20 mg total) by mouth daily.  . [DISCONTINUED] fluticasone (FLONASE) 50 MCG/ACT nasal spray INSTILL 2 SPRAYS INTO BOTH NOSTRILS ONCEA DAY AS DIRECTED  . [DISCONTINUED] hydrochlorothiazide (HYDRODIURIL) 25 MG tablet Take 1 tablet (25 mg total) by mouth daily.  . [DISCONTINUED] lisinopril (PRINIVIL,ZESTRIL) 20 MG  tablet Take 1 tablet (20 mg total) by mouth daily.    PHQ 2/9 Scores 05/02/2019 05/02/2019 01/21/2019 10/25/2018  PHQ - 2 Score 0 0 1 1  PHQ- 9 Score 0 - 3 3    BP Readings from Last 3 Encounters:  05/02/19 124/70  02/01/19 (!) 175/85  01/21/19 132/70    Physical Exam Vitals signs and nursing note reviewed.  Constitutional:      General: She is not in acute distress.    Appearance: Normal appearance. She is well-developed.  HENT:     Head: Normocephalic and atraumatic.  Neck:     Musculoskeletal: Normal range of motion.  Cardiovascular:     Rate and Rhythm: Normal rate and regular rhythm.     Pulses: Normal pulses.     Heart sounds: No murmur.  Pulmonary:     Effort: Pulmonary effort is normal. No respiratory distress.  Musculoskeletal:     Cervical back: She exhibits decreased range of motion. She exhibits no bony tenderness and no swelling.     Right lower leg: No edema.  Lymphadenopathy:     Cervical: No cervical adenopathy.  Skin:    General: Skin is warm and dry.     Capillary Refill: Capillary refill takes less than 2 seconds.     Findings: No rash.  Neurological:     General: No focal deficit present.     Mental Status: She is alert and oriented to person, place, and time.  Psychiatric:        Attention and Perception: Attention normal.        Mood and Affect: Mood normal.        Behavior: Behavior normal.        Thought Content: Thought content normal.     Wt Readings from Last 3 Encounters:  05/02/19 223 lb (101.2 kg)  02/01/19 226 lb (102.5 kg)  01/21/19 223 lb 6.4 oz (101.3 kg)    BP 124/70   Pulse 77   Ht 5\' 1"  (1.549 m)   Wt 223 lb (101.2 kg)   SpO2 96%   BMI 42.14 kg/m   Assessment and Plan: 1. Essential hypertension Clinically stable exam with well controlled BP.   Tolerating medications, lisinopril 20 mg and hctz 25 mg, without side effects at this time. Pt to continue current regimen and low sodium diet; benefits of regular exercise as  able discussed. - hydrochlorothiazide (HYDRODIURIL) 25 MG tablet; Take 1 tablet (25 mg total) by mouth daily.  Dispense: 30 tablet; Refill: 12 - lisinopril (ZESTRIL) 20 MG tablet; Take 1 tablet (20 mg total) by mouth daily.  Dispense: 30 tablet; Refill: 12 - Comprehensive metabolic panel  2. Hypothyroidism due to Hashimoto's thyroiditis Supplemented with bio-identical compounded meds - TSH+T4F+T3Free  3. Recurrent major depression in partial remission ([place]) Doing very well; staying busy with fostering cats; no SI/HI Continue current regimen - FLUoxetine (PROZAC) 20 MG capsule; Take 1 capsule (20 mg total) by mouth daily.  Dispense: 30 capsule; Refill: 12  4. Morbid obesity with BMI of 40.0-44.9, adult ([place]) Continue efforts at weight loss  5. Cervical radiculopathy Followed by Ortho at [facility] Taking celebrex 100 mg daily without gerd or increase in BP Tolerating home traction with gradual improvement in left sided neck and shoulder sx   Partially dictated using Editor, commissioning. Any errors are unintentional.  Halina Maidens, MD [facility]  05/02/2019

## 2019-05-03 LAB — COMPREHENSIVE METABOLIC PANEL
ALT: 14 IU/L (ref 0–32)
AST: 16 IU/L (ref 0–40)
Albumin/Globulin Ratio: 1.6 (ref 1.2–2.2)
Albumin: 4.1 g/dL (ref 3.8–4.8)
Alkaline Phosphatase: 72 IU/L (ref 39–117)
BUN/Creatinine Ratio: 32 — ABNORMAL HIGH (ref 12–28)
BUN: 20 mg/dL (ref 8–27)
Bilirubin Total: 0.7 mg/dL (ref 0.0–1.2)
CO2: 23 mmol/L (ref 20–29)
Calcium: 9.7 mg/dL (ref 8.7–10.3)
Chloride: 101 mmol/L (ref 96–106)
Creatinine, Ser: 0.62 mg/dL (ref 0.57–1.00)
GFR calc Af Amer: 106 mL/min/{1.73_m2} (ref >59)
GFR calc non Af Amer: 92 mL/min/{1.73_m2} (ref >59)
Globulin, Total: 2.5 g/dL (ref 1.5–4.5)
Glucose: 93 mg/dL (ref 65–99)
Potassium: 4.6 mmol/L (ref 3.5–5.2)
Sodium: 140 mmol/L (ref 134–144)
Total Protein: 6.6 g/dL (ref 6.0–8.5)

## 2019-05-03 LAB — TSH+T4F+T3FREE
Free T4: 1.45 ng/dL (ref 0.82–1.77)
T3, Free: 4.4 pg/mL (ref 2.0–4.4)
TSH: 0.011 u[IU]/mL — ABNORMAL LOW (ref 0.450–4.500)

## 2019-05-07 ENCOUNTER — Encounter: Payer: Self-pay | Admitting: Internal Medicine

## 2019-05-24 ENCOUNTER — Encounter: Payer: Self-pay | Admitting: Internal Medicine

## 2019-05-24 ENCOUNTER — Other Ambulatory Visit: Payer: Self-pay

## 2019-05-24 ENCOUNTER — Ambulatory Visit (INDEPENDENT_AMBULATORY_CARE_PROVIDER_SITE_OTHER): Payer: Medicare HMO | Admitting: Internal Medicine

## 2019-05-24 VITALS — Temp 98.6°F | Ht 61.0 in | Wt 221.0 lb

## 2019-05-24 DIAGNOSIS — J0101 Acute recurrent maxillary sinusitis: Secondary | ICD-10-CM

## 2019-05-24 MED ORDER — DOXYCYCLINE HYCLATE 100 MG PO TABS: 100.0000 mg | ORAL_TABLET | Freq: Two times a day (BID) | ORAL | 0 refills | Status: AC

## 2019-05-24 NOTE — Telephone Encounter (Signed)
Patient has sinus infection. Telephone visit at 420?

## 2019-05-24 NOTE — Progress Notes (Signed)
Date:  05/24/2019   Name:  Deanna Johnson   DOB:  Dec 20, 1950   MRN:  [id number]  I connected with this patient, Deanna Johnson, by telephone at the patient's home.  I verified that I am speaking with the correct person using two identifiers. This visit was conducted via telephone due to the Covid-19 outbreak from my office at [facility] in [place], [place]. I discussed the limitations, risks, security and privacy concerns of performing an evaluation and management service by telephone. I also discussed with the patient that there may be a patient responsible charge related to this service. The patient expressed understanding and agreed to proceed.  Chief Complaint: Sinusitis (Green mucous- sinus headache and pressure. X 1.5 week. Tried flonase, OTC Coricidin Sinus Pressure. Saline nasal spray. No relief. Flonase has caused her nose to bleed. )  Sinusitis This is a new problem. The current episode started 1 to 4 weeks ago. The problem is unchanged. There has been no fever. Associated symptoms include congestion, headaches and sinus pressure. Pertinent negatives include no chills, shortness of breath or sore throat. Past treatments include saline sprays and oral decongestants. The treatment provided mild (less sinus pressure) relief.    Review of Systems  Constitutional: Negative for chills, fatigue and fever.  HENT: Positive for congestion, nosebleeds, sinus pressure and sinus pain. Negative for sore throat and trouble swallowing.   Respiratory: Negative for chest tightness, shortness of breath and wheezing.   Cardiovascular: Negative for chest pain.  Allergic/Immunologic: Positive for environmental allergies.  Neurological: Positive for headaches. Negative for dizziness.    Patient Active Problem List   Diagnosis Date Noted  . Cervical radiculopathy 10/25/2018  . Morbid obesity with BMI of 40.0-44.9, adult ([place]) 06/27/2018  . Hyperlipidemia, mixed 05/05/2017  . Hypothyroidism due to  Hashimoto's thyroiditis 05/05/2017  . Recurrent major depression in partial remission ([place]) 04/11/2016  . Hx of colonic polyp 04/11/2016  . Arthritis 12/31/2014  . Essential hypertension 12/31/2014  . Impingement syndrome of left shoulder 12/16/2014  . H/O fibromyalgia 08/18/2012    No Known Allergies  Past Surgical History:  Procedure Laterality Date  . BREAST BIOPSY Right 1987   neg  . BREAST SURGERY     RIGHT BREAST BIOPSY  . BUNIONECTOMY    . CATARACT EXTRACTION W/PHACO Left 01/04/2019   Procedure: CATARACT EXTRACTION PHACO AND INTRAOCULAR LENS PLACEMENT ([place])  LEFT;  Surgeon: Eulogio Bear, MD;  Location: [place];  Service: Ophthalmology;  Laterality: Left;  . CATARACT EXTRACTION W/PHACO Right 02/01/2019   Procedure: CATARACT EXTRACTION PHACO AND INTRAOCULAR LENS PLACEMENT ([place])  RIGHT;  Surgeon: Eulogio Bear, MD;  Location: [place];  Service: Ophthalmology;  Laterality: Right;  . COLONOSCOPY  2011   benign polyps - f/u 2016  . COLONOSCOPY WITH PROPOFOL N/A 07/21/2017   Procedure: COLONOSCOPY WITH PROPOFOL;  Surgeon: Lollie Sails, MD;  Location: [facility] ENDOSCOPY;  Service: Endoscopy;  Laterality: N/A;  . FRACTURE SURGERY     MAXILLARY FACIAL SURGERY  . MANDIBLE FRACTURE SURGERY     jaw resection  . shoulder arthroscopy Right   . SHOULDER ARTHROSCOPY WITH OPEN ROTATOR CUFF REPAIR Left 01/30/2015   Procedure: SHOULDER ARTHROSCOPY WITH POSS OPEN ROTATOR CUFF REPAIR;  Surgeon: Leanor Kail, MD;  Location: [place];  Service: Orthopedics;  Laterality: Left;  Release of long head of biceps tendon Subacromial decompression Mini open rotator cuff repair  . TONSILLECTOMY    . TOTAL ABDOMINAL HYSTERECTOMY    .  TUBAL LIGATION  1978    Social History   Tobacco Use  . Smoking status: Former Smoker    Packs/day: 0.25    Years: 2.00    Pack years: 0.50    Types: Cigarettes    Quit date: 1972    Years since quitting: 48.8  .  Smokeless tobacco: Never Used  . Tobacco comment: quit at age 84  Substance Use Topics  . Alcohol use: No    Alcohol/week: 0.0 standard drinks  . Drug use: No     Medication list has been reviewed and updated.  Current Meds  Medication Sig  . celecoxib (CELEBREX) 100 MG capsule Take 100 mg by mouth daily.  . Cholecalciferol 2000 units TABS Take 6,000 Units by mouth daily.   Marland Kitchen FLUoxetine (PROZAC) 20 MG capsule Take 1 capsule (20 mg total) by mouth daily.  . fluticasone (FLONASE) 50 MCG/ACT nasal spray Place 2 sprays into both nostrils daily.  . hydrochlorothiazide (HYDRODIURIL) 25 MG tablet Take 1 tablet (25 mg total) by mouth daily.  Marland Kitchen lisinopril (ZESTRIL) 20 MG tablet Take 1 tablet (20 mg total) by mouth daily.  . Multiple Vitamins-Minerals (CENTRUM SILVER PO) Take by mouth.  . NONFORMULARY OR COMPOUNDED ITEM 152 mcg T4/ 36 mcg T3  . Probiotic Product (ACIDOPHILUS PROBIOTIC BLEND PO) Take by mouth.    PHQ 2/9 Scores 05/24/2019 05/02/2019 05/02/2019 01/21/2019  PHQ - 2 Score 0 0 0 1  PHQ- 9 Score 0 0 - 3    BP Readings from Last 3 Encounters:  05/02/19 124/70  02/01/19 (!) 175/85  01/21/19 132/70    Physical Exam Pulmonary:     Comments: No cough or dyspnea noted during the call Neurological:     Mental Status: She is alert.  Psychiatric:        Attention and Perception: Attention normal.        Mood and Affect: Mood normal.        Speech: Speech normal.        Cognition and Memory: Cognition normal.     Wt Readings from Last 3 Encounters:  05/24/19 221 lb (100.2 kg)  05/02/19 223 lb (101.2 kg)  02/01/19 226 lb (102.5 kg)    Temp 98.6 F (37 C) (Oral)   Ht 5\' 1"  (1.549 m)   Wt 221 lb (100.2 kg)   BMI 41.76 kg/m   Assessment and Plan: 1. Acute recurrent maxillary sinusitis Continue saline rinses; flonase spray and oral decongestants - doxycycline (VIBRA-TABS) 100 MG tablet; Take 1 tablet (100 mg total) by mouth 2 (two) times daily for 10 days.  Dispense: 20  tablet; Refill: 0  I spent 10 minutes on this encounter Partially dictated using Editor, commissioning. Any errors are unintentional.  Halina Maidens, MD [facility]  05/24/2019

## 2019-10-01 DIAGNOSIS — G8929 Other chronic pain: Secondary | ICD-10-CM | POA: Diagnosis not present

## 2019-10-01 DIAGNOSIS — M5442 Lumbago with sciatica, left side: Secondary | ICD-10-CM | POA: Diagnosis not present

## 2019-10-01 DIAGNOSIS — M5416 Radiculopathy, lumbar region: Secondary | ICD-10-CM | POA: Diagnosis not present

## 2019-10-01 DIAGNOSIS — Z6841 Body Mass Index (BMI) 40.0 and over, adult: Secondary | ICD-10-CM | POA: Diagnosis not present

## 2019-10-28 ENCOUNTER — Other Ambulatory Visit: Payer: Self-pay

## 2019-10-28 ENCOUNTER — Ambulatory Visit (INDEPENDENT_AMBULATORY_CARE_PROVIDER_SITE_OTHER): Payer: Medicare HMO | Admitting: Internal Medicine

## 2019-10-28 ENCOUNTER — Encounter: Payer: Self-pay | Admitting: Internal Medicine

## 2019-10-28 VITALS — BP 128/78 | HR 85 | Temp 96.4°F | Ht 61.0 in | Wt 228.0 lb

## 2019-10-28 DIAGNOSIS — Z1231 Encounter for screening mammogram for malignant neoplasm of breast: Secondary | ICD-10-CM | POA: Diagnosis not present

## 2019-10-28 DIAGNOSIS — F3341 Major depressive disorder, recurrent, in partial remission: Secondary | ICD-10-CM

## 2019-10-28 DIAGNOSIS — I10 Essential (primary) hypertension: Secondary | ICD-10-CM

## 2019-10-28 DIAGNOSIS — E038 Other specified hypothyroidism: Secondary | ICD-10-CM | POA: Diagnosis not present

## 2019-10-28 DIAGNOSIS — E063 Autoimmune thyroiditis: Secondary | ICD-10-CM | POA: Diagnosis not present

## 2019-10-28 DIAGNOSIS — Z6841 Body Mass Index (BMI) 40.0 and over, adult: Secondary | ICD-10-CM

## 2019-10-28 DIAGNOSIS — Z0000 Encounter for general adult medical examination without abnormal findings: Secondary | ICD-10-CM | POA: Diagnosis not present

## 2019-10-28 DIAGNOSIS — E782 Mixed hyperlipidemia: Secondary | ICD-10-CM

## 2019-10-28 LAB — POCT URINALYSIS DIPSTICK
Bilirubin, UA: NEGATIVE
Blood, UA: NEGATIVE
Glucose, UA: NEGATIVE
Ketones, UA: NEGATIVE
Leukocytes, UA: NEGATIVE
Nitrite, UA: NEGATIVE
Protein, UA: NEGATIVE
Spec Grav, UA: 1.015 (ref 1.010–1.025)
Urobilinogen, UA: 0.2 E.U./dL
pH, UA: 5 (ref 5.0–8.0)

## 2019-10-28 MED ORDER — FLUTICASONE PROPIONATE 50 MCG/ACT NA SUSP: 2.0000 | Freq: Every day | NASAL | 12 refills | Status: DC

## 2019-10-28 NOTE — Progress Notes (Signed)
Date:  10/28/2019   Name:  Deanna Johnson   DOB:  07/27/1950   MRN:  [id number]   Chief Complaint: Annual Exam (Breast Exam. No pap- aged out.) Deanna Johnson is a 70 y.o. female who presents today for her Complete Annual Exam. She feels well. She reports exercising rarely due to sciatica. She reports she is sleeping fairly well. She denies breast issues.  She stays busy fostering kittens and doing some gardening.  Mammogram  07/2018 DEXA  05/2017 Colonoscopy  07/21/2017 Immunization History  Administered Date(s) Administered  . Fluad Quad(high Dose 65+) 04/01/2019  . Influenza, High Dose Seasonal PF 04/10/2017  . Influenza,inj,Quad PF,6+ Mos 04/11/2016  . Influenza,inj,quad, With Preservative 05/18/2018  . Influenza-Unspecified 08/01/2011, 04/02/2014  . PFIZER SARS-COV-2 Vaccination 08/11/2019, 08/31/2019  . Pneumococcal Conjugate-13 11/27/2014  . Pneumococcal Polysaccharide-23 04/11/2016  . Tdap 08/14/2013  . Zoster 08/14/2013    Hypertension The problem is controlled. Pertinent negatives include no chest pain, headaches, palpitations or shortness of breath. Past treatments include diuretics. The current treatment provides significant improvement. There are no compliance problems.  Identifiable causes of hypertension include a thyroid problem.  Depression        This is a chronic problem.The problem is unchanged.  Associated symptoms include no fatigue and no headaches.  Past treatments include SSRIs - Selective serotonin reuptake inhibitors.  Compliance with treatment is good.  Past medical history includes thyroid problem.   Thyroid Problem Presents for follow-up visit. Patient reports no anxiety, constipation, diarrhea, fatigue, palpitations or tremors. The symptoms have been stable (on compounded thyroid supplement).    Lab Results  Component Value Date   CREATININE 0.62 05/02/2019   BUN 20 05/02/2019   NA 140 05/02/2019   K 4.6 05/02/2019   CL 101 05/02/2019   CO2 23  05/02/2019   Lab Results  Component Value Date   CHOL 266 (H) 10/25/2018   HDL 73 10/25/2018   LDLCALC 163 (H) 10/25/2018   TRIG 149 10/25/2018   CHOLHDL 3.6 10/25/2018    Lab Results  Component Value Date   TSH 0.011 (L) 05/02/2019   T3TOTAL 184 (H) 04/24/2018   T4TOTAL 8.9 04/24/2018   No results found for: HGBA1C Lab Results  Component Value Date   WBC 7.0 10/25/2018   HGB 13.6 10/25/2018   HCT 41.9 10/25/2018   MCV 84 10/25/2018   PLT 355 10/25/2018   Lab Results  Component Value Date   ALT 14 05/02/2019   AST 16 05/02/2019   ALKPHOS 72 05/02/2019   BILITOT 0.7 05/02/2019     Review of Systems  Constitutional: Negative for chills, fatigue and fever.  HENT: Negative for congestion, hearing loss, tinnitus, trouble swallowing and voice change.   Eyes: Negative for visual disturbance.  Respiratory: Negative for cough, chest tightness, shortness of breath and wheezing.   Cardiovascular: Negative for chest pain, palpitations and leg swelling.  Gastrointestinal: Negative for abdominal pain, constipation, diarrhea and vomiting.  Endocrine: Negative for polydipsia and polyuria.  Genitourinary: Negative for dysuria, frequency, genital sores, vaginal bleeding and vaginal discharge.  Musculoskeletal: Negative for arthralgias, gait problem and joint swelling.  Skin: Negative for color change and rash.  Neurological: Negative for dizziness, tremors, light-headedness and headaches.  Hematological: Negative for adenopathy. Does not bruise/bleed easily.  Psychiatric/Behavioral: Positive for depression. Negative for dysphoric mood and sleep disturbance. The patient is not nervous/anxious.     Patient Active Problem List   Diagnosis Date Noted  . Cervical radiculopathy 10/25/2018  .  BMI 40.0-44.9, adult ([place]) 06/27/2018  . Hyperlipidemia, mixed 05/05/2017  . Hypothyroidism due to Hashimoto's thyroiditis 05/05/2017  . Recurrent major depression in partial remission ([place])  04/11/2016  . Hx of colonic polyp 04/11/2016  . Arthritis 12/31/2014  . Essential hypertension 12/31/2014  . Impingement syndrome of left shoulder 12/16/2014  . H/O fibromyalgia 08/18/2012    No Known Allergies  Past Surgical History:  Procedure Laterality Date  . BREAST BIOPSY Right 1987   neg  . BREAST SURGERY     RIGHT BREAST BIOPSY  . BUNIONECTOMY    . CATARACT EXTRACTION W/PHACO Left 01/04/2019   Procedure: CATARACT EXTRACTION PHACO AND INTRAOCULAR LENS PLACEMENT ([place])  LEFT;  Surgeon: Eulogio Bear, MD;  Location: [place];  Service: Ophthalmology;  Laterality: Left;  . CATARACT EXTRACTION W/PHACO Right 02/01/2019   Procedure: CATARACT EXTRACTION PHACO AND INTRAOCULAR LENS PLACEMENT ([place])  RIGHT;  Surgeon: Eulogio Bear, MD;  Location: [place];  Service: Ophthalmology;  Laterality: Right;  . COLONOSCOPY  2011   benign polyps - f/u 2016  . COLONOSCOPY WITH PROPOFOL N/A 07/21/2017   Procedure: COLONOSCOPY WITH PROPOFOL;  Surgeon: Lollie Sails, MD;  [facility] ENDOSCOPY;  Service: Endoscopy;  Laterality: N/A;  . FRACTURE SURGERY     MAXILLARY FACIAL SURGERY  . MANDIBLE FRACTURE SURGERY     jaw resection  . shoulder arthroscopy Right   . SHOULDER ARTHROSCOPY WITH OPEN ROTATOR CUFF REPAIR Left 01/30/2015   Procedure: SHOULDER ARTHROSCOPY WITH POSS OPEN ROTATOR CUFF REPAIR;  Surgeon: Leanor Kail, MD;  Location: [place];  Service: Orthopedics;  Laterality: Left;  Release of long head of biceps tendon Subacromial decompression Mini open rotator cuff repair  . TONSILLECTOMY    . TOTAL ABDOMINAL HYSTERECTOMY    . TUBAL LIGATION  1978    Social History   Tobacco Use  . Smoking status: Former Smoker    Packs/day: 0.25    Years: 2.00    Pack years: 0.50    Types: Cigarettes    Quit date: 1972    Years since quitting: 49.3  . Smokeless tobacco: Never Used  . Tobacco comment: quit at age 37  Substance Use Topics  .  Alcohol use: No    Alcohol/week: 0.0 standard drinks  . Drug use: No     Medication list has been reviewed and updated.  Current Meds  Medication Sig  . celecoxib (CELEBREX) 100 MG capsule Take 100 mg by mouth daily.  . Cholecalciferol 2000 units TABS Take 6,000 Units by mouth daily.   . cyclobenzaprine (FLEXERIL) 10 MG tablet Take 1 tablet by mouth as needed.  Marland Kitchen FLUoxetine (PROZAC) 20 MG capsule Take 1 capsule (20 mg total) by mouth daily.  . fluticasone (FLONASE) 50 MCG/ACT nasal spray Place 2 sprays into both nostrils daily.  . hydrochlorothiazide (HYDRODIURIL) 25 MG tablet Take 1 tablet (25 mg total) by mouth daily.  Marland Kitchen lisinopril (ZESTRIL) 20 MG tablet Take 1 tablet (20 mg total) by mouth daily.  . Multiple Vitamins-Minerals (CENTRUM SILVER PO) Take by mouth.  . NONFORMULARY OR COMPOUNDED ITEM 152 mcg T4/ 36 mcg T3    PHQ 2/9 Scores 10/28/2019 05/24/2019 05/02/2019 05/02/2019  PHQ - 2 Score 1 0 0 0  PHQ- 9 Score 4 0 0 -    BP Readings from Last 3 Encounters:  10/28/19 128/78  05/02/19 124/70  02/01/19 (!) 175/85    Physical Exam Vitals and nursing note reviewed.  Constitutional:  General: She is not in acute distress.    Appearance: She is well-developed. She is obese.  HENT:     Head: Normocephalic and atraumatic.     Right Ear: Tympanic membrane and ear canal normal.     Left Ear: Tympanic membrane and ear canal normal.     Nose:     Right Sinus: No maxillary sinus tenderness.     Left Sinus: No maxillary sinus tenderness.  Eyes:     General: No scleral icterus.       Right eye: No discharge.        Left eye: No discharge.     Conjunctiva/sclera: Conjunctivae normal.  Neck:     Thyroid: No thyromegaly.     Vascular: No carotid bruit.  Cardiovascular:     Rate and Rhythm: Normal rate and regular rhythm.     Pulses: Normal pulses.     Heart sounds: Normal heart sounds.  Pulmonary:     Effort: Pulmonary effort is normal. No respiratory distress.      Breath sounds: No wheezing.  Chest:     Breasts:        Right: No mass, nipple discharge, skin change or tenderness.        Left: No mass, nipple discharge, skin change or tenderness.  Abdominal:     General: Bowel sounds are normal.     Palpations: Abdomen is soft.     Tenderness: There is no abdominal tenderness.  Musculoskeletal:        General: Normal range of motion.     Cervical back: Normal range of motion. No erythema.     Right lower leg: No edema.     Left lower leg: No edema.  Lymphadenopathy:     Cervical: No cervical adenopathy.  Skin:    General: Skin is warm and dry.     Capillary Refill: Capillary refill takes less than 2 seconds.     Findings: No rash.  Neurological:     General: No focal deficit present.     Mental Status: She is alert and oriented to person, place, and time.     Cranial Nerves: No cranial nerve deficit.     Sensory: No sensory deficit.     Deep Tendon Reflexes: Reflexes are normal and symmetric.  Psychiatric:        Mood and Affect: Mood normal.        Speech: Speech normal.        Thought Content: Thought content normal.     Wt Readings from Last 3 Encounters:  10/28/19 228 lb (103.4 kg)  05/24/19 221 lb (100.2 kg)  05/02/19 223 lb (101.2 kg)    BP 128/78   Pulse 85   Temp (!) 96.4 F (35.8 C) (Temporal)   Ht 5\' 1"  (1.549 m)   Wt 228 lb (103.4 kg)   SpO2 96%   BMI 43.08 kg/m   Assessment and Plan: 1. Annual physical exam Normal exam except for weight Work on staying active, improving diet Unable to exercise due to recurrent sciatica - POCT urinalysis dipstick  2. Encounter for screening mammogram for breast cancer To be scheduled at [place]; Future  3. Essential hypertension Clinically stable exam with well controlled BP. Tolerating medications without side effects at this time. Pt to continue current regimen and low sodium diet; benefits of regular exercise as able discussed. - CBC with  Differential/Platelet - Comprehensive metabolic panel  4. Hypothyroidism due to  Hashimoto's thyroiditis Continue compounded thyroid supplement - TSH+T4F+T3Free  5. Recurrent major depression in partial remission ([facility]) Clinically stable on current regimen with good control of symptoms, No SI or HI. Will continue current therapy.  6. Hyperlipidemia, mixed Check labs and advise Low fat diet recommended - Lipid panel  7. BMI 40.0-44.9, adult ([place]) Diet changes and physical activity as above   Partially dictated using Editor, commissioning. Any errors are unintentional.  Halina Maidens, MD [facility]  10/28/2019

## 2019-10-29 LAB — CBC WITH DIFFERENTIAL/PLATELET
Basophils Absolute: 0 10*3/uL (ref 0.0–0.2)
Basos: 1 %
EOS (ABSOLUTE): 0.3 10*3/uL (ref 0.0–0.4)
Eos: 5 %
Hematocrit: 40.3 % (ref 34.0–46.6)
Hemoglobin: 13.5 g/dL (ref 11.1–15.9)
Immature Grans (Abs): 0 10*3/uL (ref 0.0–0.1)
Immature Granulocytes: 0 %
Lymphocytes Absolute: 1.9 10*3/uL (ref 0.7–3.1)
Lymphs: 35 %
MCH: 27.7 pg (ref 26.6–33.0)
MCHC: 33.5 g/dL (ref 31.5–35.7)
MCV: 83 fL (ref 79–97)
Monocytes Absolute: 0.5 10*3/uL (ref 0.1–0.9)
Monocytes: 10 %
Neutrophils Absolute: 2.7 10*3/uL (ref 1.4–7.0)
Neutrophils: 49 %
Platelets: 289 10*3/uL (ref 150–450)
RBC: 4.88 x10E6/uL (ref 3.77–5.28)
RDW: 13.1 % (ref 11.7–15.4)
WBC: 5.4 10*3/uL (ref 3.4–10.8)

## 2019-10-29 LAB — TSH+T4F+T3FREE
Free T4: 1.52 ng/dL (ref 0.82–1.77)
T3, Free: 6.3 pg/mL — ABNORMAL HIGH (ref 2.0–4.4)
TSH: <0.005 u[IU]/mL — ABNORMAL LOW (ref 0.450–4.500)

## 2019-10-29 LAB — COMPREHENSIVE METABOLIC PANEL
ALT: 12 IU/L (ref 0–32)
AST: 14 IU/L (ref 0–40)
Albumin/Globulin Ratio: 1.5 (ref 1.2–2.2)
Albumin: 3.9 g/dL (ref 3.8–4.8)
Alkaline Phosphatase: 73 IU/L (ref 39–117)
BUN/Creatinine Ratio: 16 (ref 12–28)
BUN: 14 mg/dL (ref 8–27)
Bilirubin Total: 0.6 mg/dL (ref 0.0–1.2)
CO2: 25 mmol/L (ref 20–29)
Calcium: 10.2 mg/dL (ref 8.7–10.3)
Chloride: 99 mmol/L (ref 96–106)
Creatinine, Ser: 0.87 mg/dL (ref 0.57–1.00)
GFR calc Af Amer: 78 mL/min/{1.73_m2} (ref >59)
GFR calc non Af Amer: 68 mL/min/{1.73_m2} (ref >59)
Globulin, Total: 2.6 g/dL (ref 1.5–4.5)
Glucose: 102 mg/dL — ABNORMAL HIGH (ref 65–99)
Potassium: 4.9 mmol/L (ref 3.5–5.2)
Sodium: 143 mmol/L (ref 134–144)
Total Protein: 6.5 g/dL (ref 6.0–8.5)

## 2019-10-29 LAB — LIPID PANEL
Chol/HDL Ratio: 3.4 ratio (ref 0.0–4.4)
Cholesterol, Total: 237 mg/dL — ABNORMAL HIGH (ref 100–199)
HDL: 70 mg/dL (ref >39)
LDL Chol Calc (NIH): 142 mg/dL — ABNORMAL HIGH (ref 0–99)
Triglycerides: 144 mg/dL (ref 0–149)
VLDL Cholesterol Cal: 25 mg/dL (ref 5–40)

## 2019-11-26 ENCOUNTER — Other Ambulatory Visit: Payer: Self-pay | Admitting: Orthopedic Surgery

## 2019-11-26 DIAGNOSIS — M4807 Spinal stenosis, lumbosacral region: Secondary | ICD-10-CM

## 2019-12-27 ENCOUNTER — Ambulatory Visit
Admission: RE | Admit: 2019-12-27 | Discharge: 2019-12-27 | Disposition: A | Discharge status: Home / Self Care | Payer: Medicare HMO | Source: Ambulatory Visit | Attending: Orthopedic Surgery | Admitting: Orthopedic Surgery

## 2019-12-27 DIAGNOSIS — M4807 Spinal stenosis, lumbosacral region: Secondary | ICD-10-CM

## 2019-12-27 DIAGNOSIS — M48061 Spinal stenosis, lumbar region without neurogenic claudication: Secondary | ICD-10-CM | POA: Diagnosis not present

## 2020-01-08 ENCOUNTER — Other Ambulatory Visit: Payer: Self-pay | Admitting: Orthopedic Surgery

## 2020-01-08 DIAGNOSIS — M5412 Radiculopathy, cervical region: Secondary | ICD-10-CM

## 2020-01-08 DIAGNOSIS — M5030 Other cervical disc degeneration, unspecified cervical region: Secondary | ICD-10-CM

## 2020-01-23 ENCOUNTER — Encounter: Payer: Self-pay | Admitting: Internal Medicine

## 2020-01-29 ENCOUNTER — Ambulatory Visit: Payer: Medicare HMO

## 2020-02-03 ENCOUNTER — Other Ambulatory Visit: Payer: Self-pay

## 2020-02-03 ENCOUNTER — Ambulatory Visit
Admission: RE | Admit: 2020-02-03 | Discharge: 2020-02-03 | Disposition: A | Discharge status: Home / Self Care | Payer: Medicare HMO | Source: Ambulatory Visit | Attending: Orthopedic Surgery | Admitting: Orthopedic Surgery

## 2020-02-03 DIAGNOSIS — M4802 Spinal stenosis, cervical region: Secondary | ICD-10-CM | POA: Diagnosis not present

## 2020-02-03 DIAGNOSIS — M5030 Other cervical disc degeneration, unspecified cervical region: Secondary | ICD-10-CM

## 2020-02-03 DIAGNOSIS — M5412 Radiculopathy, cervical region: Secondary | ICD-10-CM

## 2020-02-04 DIAGNOSIS — M5136 Other intervertebral disc degeneration, lumbar region: Secondary | ICD-10-CM | POA: Diagnosis not present

## 2020-02-04 DIAGNOSIS — M5416 Radiculopathy, lumbar region: Secondary | ICD-10-CM | POA: Diagnosis not present

## 2020-02-04 DIAGNOSIS — M48062 Spinal stenosis, lumbar region with neurogenic claudication: Secondary | ICD-10-CM | POA: Diagnosis not present

## 2020-02-24 ENCOUNTER — Ambulatory Visit (INDEPENDENT_AMBULATORY_CARE_PROVIDER_SITE_OTHER): Payer: Medicare HMO

## 2020-02-24 DIAGNOSIS — Z780 Asymptomatic menopausal state: Secondary | ICD-10-CM

## 2020-02-24 DIAGNOSIS — Z0000 Encounter for general adult medical examination without abnormal findings: Secondary | ICD-10-CM | POA: Diagnosis not present

## 2020-02-24 NOTE — Patient Instructions (Signed)
Ms. Deanna Johnson , Thank you for taking time to come for your Medicare Wellness Visit. I appreciate your ongoing commitment to your health goals. Please review the following plan we discussed and let me know if I can assist you in the future.   Screening recommendations/referrals: Colonoscopy: done 07/31/17 Mammogram: done 08/14/18. Please call [phone] to schedule your mammogram and bone density screening.   Bone Density: done 05/25/17 Recommended yearly ophthalmology/optometry visit for glaucoma screening and checkup Recommended yearly dental visit for hygiene and checkup  Vaccinations: Influenza vaccine: done 04/01/19 Pneumococcal vaccine: done 04/11/16 Tdap vaccine: done 08/14/13 Shingles vaccine: Shingrix discussed. Please contact your pharmacy for coverage information.  Covid-19:done 08/11/19 & 08/31/19  Conditions/risks identified: Recommend increasing physical activity as tolerated.   Next appointment: Follow up in one year for your annual wellness visit    Preventive Care 65 Years and Older, Female Preventive care refers to lifestyle choices and visits with your health care provider that can promote health and wellness. What does preventive care include?  A yearly physical exam. This is also called an annual well check.  Dental exams once or twice a year.  Routine eye exams. Ask your health care provider how often you should have your eyes checked.  Personal lifestyle choices, including:  Daily care of your teeth and gums.  Regular physical activity.  Eating a healthy diet.  Avoiding tobacco and drug use.  Limiting alcohol use.  Practicing safe sex.  Taking low-dose aspirin every day.  Taking vitamin and mineral supplements as recommended by your health care provider. What happens during an annual well check? The services and screenings done by your health care provider during your annual well check will depend on your age, overall health, lifestyle risk factors, and  family history of disease. Counseling  Your health care provider may ask you questions about your:  Alcohol use.  Tobacco use.  Drug use.  Emotional well-being.  Home and relationship well-being.  Sexual activity.  Eating habits.  History of falls.  Memory and ability to understand (cognition).  Work and work Statistician.  Reproductive health. Screening  You may have the following tests or measurements:  Height, weight, and BMI.  Blood pressure.  Lipid and cholesterol levels. These may be checked every 5 years, or more frequently if you are over 7 years old.  Skin check.  Lung cancer screening. You may have this screening every year starting at age 6 if you have a 30-pack-year history of smoking and currently smoke or have quit within the past 15 years.  Fecal occult blood test (FOBT) of the stool. You may have this test every year starting at age 83.  Flexible sigmoidoscopy or colonoscopy. You may have a sigmoidoscopy every 5 years or a colonoscopy every 10 years starting at age 65.  Hepatitis C blood test.  Hepatitis B blood test.  Sexually transmitted disease (STD) testing.  Diabetes screening. This is done by checking your blood sugar (glucose) after you have not eaten for a while (fasting). You may have this done every 1-3 years.  Bone density scan. This is done to screen for osteoporosis. You may have this done starting at age 37.  Mammogram. This may be done every 1-2 years. Talk to your health care provider about how often you should have regular mammograms. Talk with your health care provider about your test results, treatment options, and if necessary, the need for more tests. Vaccines  Your health care provider may recommend certain vaccines, such as:  Influenza vaccine. This is recommended every year.  Tetanus, diphtheria, and acellular pertussis (Tdap, Td) vaccine. You may need a Td booster every 10 years.  Zoster vaccine. You may need this  after age 28.  Pneumococcal 13-valent conjugate (PCV13) vaccine. One dose is recommended after age 10.  Pneumococcal polysaccharide (PPSV23) vaccine. One dose is recommended after age 85. Talk to your health care provider about which screenings and vaccines you need and how often you need them. This information is not intended to replace advice given to you by your health care provider. Make sure you discuss any questions you have with your health care provider. Document Released: 07/31/2015 Document Revised: 03/23/2016 Document Reviewed: 05/05/2015 Elsevier Interactive Patient Education  2017 [place] Prevention in the Home Falls can cause injuries. They can happen to people of all ages. There are many things you can do to make your home safe and to help prevent falls. What can I do on the outside of my home?  Regularly fix the edges of walkways and driveways and fix any cracks.  Remove anything that might make you trip as you walk through a door, such as a raised step or threshold.  Trim any bushes or trees on the path to your home.  Use bright outdoor lighting.  Clear any walking paths of anything that might make someone trip, such as rocks or tools.  Regularly check to see if handrails are loose or broken. Make sure that both sides of any steps have handrails.  Any raised decks and porches should have guardrails on the edges.  Have any leaves, snow, or ice cleared regularly.  Use sand or salt on walking paths during winter.  Clean up any spills in your garage right away. This includes oil or grease spills. What can I do in the bathroom?  Use night lights.  Install grab bars by the toilet and in the tub and shower. Do not use towel bars as grab bars.  Use non-skid mats or decals in the tub or shower.  If you need to sit down in the shower, use a plastic, non-slip stool.  Keep the floor dry. Clean up any water that spills on the floor as soon as it  happens.  Remove soap buildup in the tub or shower regularly.  Attach bath mats securely with double-sided non-slip rug tape.  Do not have throw rugs and other things on the floor that can make you trip. What can I do in the bedroom?  Use night lights.  Make sure that you have a light by your bed that is easy to reach.  Do not use any sheets or blankets that are too big for your bed. They should not hang down onto the floor.  Have a firm chair that has side arms. You can use this for support while you get dressed.  Do not have throw rugs and other things on the floor that can make you trip. What can I do in the kitchen?  Clean up any spills right away.  Avoid walking on wet floors.  Keep items that you use a lot in easy-to-reach places.  If you need to reach something above you, use a strong step stool that has a grab bar.  Keep electrical cords out of the way.  Do not use floor polish or wax that makes floors slippery. If you must use wax, use non-skid floor wax.  Do not have throw rugs and other things on the floor that  can make you trip. What can I do with my stairs?  Do not leave any items on the stairs.  Make sure that there are handrails on both sides of the stairs and use them. Fix handrails that are broken or loose. Make sure that handrails are as long as the stairways.  Check any carpeting to make sure that it is firmly attached to the stairs. Fix any carpet that is loose or worn.  Avoid having throw rugs at the top or bottom of the stairs. If you do have throw rugs, attach them to the floor with carpet tape.  Make sure that you have a light switch at the top of the stairs and the bottom of the stairs. If you do not have them, ask someone to add them for you. What else can I do to help prevent falls?  Wear shoes that:  Do not have high heels.  Have rubber bottoms.  Are comfortable and fit you well.  Are closed at the toe. Do not wear sandals.  If you  use a stepladder:  Make sure that it is fully opened. Do not climb a closed stepladder.  Make sure that both sides of the stepladder are locked into place.  Ask someone to hold it for you, if possible.  Clearly mark and make sure that you can see:  Any grab bars or handrails.  First and last steps.  Where the edge of each step is.  Use tools that help you move around (mobility aids) if they are needed. These include:  Canes.  Walkers.  Scooters.  Crutches.  Turn on the lights when you go into a dark area. Replace any light bulbs as soon as they burn out.  Set up your furniture so you have a clear path. Avoid moving your furniture around.  If any of your floors are uneven, fix them.  If there are any pets around you, be aware of where they are.  Review your medicines with your doctor. Some medicines can make you feel dizzy. This can increase your chance of falling. Ask your doctor what other things that you can do to help prevent falls. This information is not intended to replace advice given to you by your health care provider. Make sure you discuss any questions you have with your health care provider. Document Released: 04/30/2009 Document Revised: 12/10/2015 Document Reviewed: 08/08/2014 Elsevier Interactive Patient Education  2017 [employer].

## 2020-02-24 NOTE — Progress Notes (Signed)
Subjective:   Deanna Johnson is a 69 y.o. female who presents for Medicare Annual (Subsequent) preventive examination.  Virtual Visit via Telephone Note  I connected with  Deanna Johnson on 02/24/20 at  9:20 AM EDT by telephone and verified that I am speaking with the correct person using two identifiers.  Medicare Annual Wellness visit completed telephonically due to Covid-19 pandemic.   Location: Patient: home Provider: [facility]   I discussed the limitations, risks, security and privacy concerns of performing an evaluation and management service by telephone and the availability of in person appointments. The patient expressed understanding and agreed to proceed.  Unable to perform video visit due to video visit attempted and failed and/or patient does not have video capability.   Some vital signs may be absent or patient reported.   Clemetine Marker, LPN    Review of Systems     Cardiac Risk Factors include: advanced age (>41men, >49 women);hypertension;obesity (BMI >30kg/m2)     Objective:    Today's Vitals   02/24/20 0930  PainSc: 4    There is no height or weight on file to calculate BMI.  Advanced Directives 02/24/2020 02/01/2019 01/21/2019 01/04/2019 07/21/2017 04/10/2017 04/11/2016  Does Patient Have a Medical Advance Directive? Yes Yes Yes Yes Yes Yes Yes  Type of Paramedic of [place];Living will [place];Living will [place];Living will [place];Living will [place];Living will [place];Living will [place];Living will  Does patient want to make changes to medical advance directive? - No - Patient declined - - - - -  Copy of [place] in Chart? Yes - validated most recent copy scanned in chart (See row information) Yes - validated most recent copy scanned in chart (See row information) No - copy requested No - copy  requested No - copy requested No - copy requested -  Would patient like information on creating a medical advance directive? - - - - - - -    Current Medications (verified) Outpatient Encounter Medications as of 02/24/2020  Medication Sig  . celecoxib (CELEBREX) 200 MG capsule Take 1 capsule by mouth daily.  . Cholecalciferol 2000 units TABS Take 6,000 Units by mouth daily.   Marland Kitchen FLUoxetine (PROZAC) 20 MG capsule Take 1 capsule (20 mg total) by mouth daily.  . fluticasone (FLONASE) 50 MCG/ACT nasal spray Place 2 sprays into both nostrils daily.  . hydrochlorothiazide (HYDRODIURIL) 25 MG tablet Take 1 tablet (25 mg total) by mouth daily.  Marland Kitchen lisinopril (ZESTRIL) 20 MG tablet Take 1 tablet (20 mg total) by mouth daily.  . magnesium 30 MG tablet Take 30 mg by mouth 2 (two) times daily.  . Multiple Vitamins-Minerals (CENTRUM SILVER PO) Take by mouth.  . NONFORMULARY OR COMPOUNDED ITEM 152 mcg T4/ 36 mcg T3  . zinc gluconate 50 MG tablet Take 50 mg by mouth daily.  . cyclobenzaprine (FLEXERIL) 10 MG tablet Take 1 tablet by mouth as needed. (Patient not taking: Reported on 02/24/2020)  . [DISCONTINUED] celecoxib (CELEBREX) 100 MG capsule Take 100 mg by mouth daily.   No facility-administered encounter medications on file as of 02/24/2020.    Allergies (verified) Patient has no known allergies.   History: Past Medical History:  Diagnosis Date  . Arthritis   . Complete tear of rotator cuff    RIGHT  . Depression   . Fibromyalgia   . Heart murmur  slight no problems  . History of tick-borne disease   . Hypertension   . Hypothyroidism   . Shoulder pain, left    TORN ROTATOR CUFF   Past Surgical History:  Procedure Laterality Date  . BREAST BIOPSY Right 1987   neg  . BREAST SURGERY     RIGHT BREAST BIOPSY  . BUNIONECTOMY    . CATARACT EXTRACTION W/PHACO Left 01/04/2019   Procedure: CATARACT EXTRACTION PHACO AND INTRAOCULAR LENS PLACEMENT ([place])  LEFT;  Surgeon: Eulogio Bear, MD;   Location: [place];  Service: Ophthalmology;  Laterality: Left;  . CATARACT EXTRACTION W/PHACO Right 02/01/2019   Procedure: CATARACT EXTRACTION PHACO AND INTRAOCULAR LENS PLACEMENT ([place])  RIGHT;  Surgeon: Eulogio Bear, MD;  Location: [place];  Service: Ophthalmology;  Laterality: Right;  . COLONOSCOPY  2011   benign polyps - f/u 2016  . COLONOSCOPY WITH PROPOFOL N/A 07/21/2017   Procedure: COLONOSCOPY WITH PROPOFOL;  Surgeon: Lollie Sails, MD;  Location: [facility] ENDOSCOPY;  Service: Endoscopy;  Laterality: N/A;  . FRACTURE SURGERY     MAXILLARY FACIAL SURGERY  . MANDIBLE FRACTURE SURGERY     jaw resection  . shoulder arthroscopy Right   . SHOULDER ARTHROSCOPY WITH OPEN ROTATOR CUFF REPAIR Left 01/30/2015   Procedure: SHOULDER ARTHROSCOPY WITH POSS OPEN ROTATOR CUFF REPAIR;  Surgeon: Leanor Kail, MD;  Location: [place];  Service: Orthopedics;  Laterality: Left;  Release of long head of biceps tendon Subacromial decompression Mini open rotator cuff repair  . TONSILLECTOMY    . TOTAL ABDOMINAL HYSTERECTOMY    . TUBAL LIGATION  1978   Family History  Problem Relation Age of Onset  . Breast cancer Maternal Aunt 77  . Diabetes Maternal Aunt   . Hypertension Mother   . Bursitis Mother   . Early death Father    Social History   Socioeconomic History  . Marital status: Divorced    Spouse name: Not on file  . Number of children: 1  . Years of education: Not on file  . Highest education level: Not on file  Occupational History  . Occupation: retired  Tobacco Use  . Smoking status: Former Smoker    Packs/day: 0.25    Years: 2.00    Pack years: 0.50    Types: Cigarettes    Quit date: 1972    Years since quitting: 49.6  . Smokeless tobacco: Never Used  . Tobacco comment: quit at age 71  Vaping Use  . Vaping Use: Never used  Substance and Sexual Activity  . Alcohol use: No    Alcohol/week: 0.0 standard drinks  . Drug use: No  .  Sexual activity: Not Currently    Birth control/protection: Post-menopausal  Other Topics Concern  . Not on file  Social History Narrative   Pt lives alone.    Social Determinants of Health   Financial Resource Strain: Low Risk   . Difficulty of Paying Living Expenses: Not hard at all  Food Insecurity: No Food Insecurity  . Worried About Charity fundraiser in the Last Year: Never true  . Ran Out of Food in the Last Year: Never true  Transportation Needs: No Transportation Needs  . Lack of Transportation (Medical): No  . Lack of Transportation (Non-Medical): No  Physical Activity: Inactive  . Days of Exercise per Week: 0 days  . Minutes of Exercise per Session: 0 min  Stress: No Stress Concern Present  . Feeling of Stress : Only a  little  Social Connections: Socially Isolated  . Frequency of Communication with Friends and Family: More than three times a week  . Frequency of Social Gatherings with Friends and Family: Three times a week  . Attends Religious Services: Never  . Active Member of Clubs or Organizations: No  . Attends Archivist Meetings: Never  . Marital Status: Divorced    Tobacco Counseling Counseling given: Not Answered Comment: quit at age 51   Clinical Intake:  Pre-visit preparation completed: Yes  Pain : 0-10 Pain Score: 4  Pain Type: Chronic pain Pain Location: Back Pain Orientation: Lower Pain Descriptors / Indicators: Aching, Constant, Shooting Pain Onset: More than a month ago Pain Frequency: Constant     Nutritional Risks: None Diabetes: No  How often do you need to have someone help you when you read instructions, pamphlets, or other written materials from your doctor or pharmacy?: 1 - Never    Interpreter Needed?: No  Information entered by :: Clemetine Marker LPN   Activities of Daily Living In your present state of health, do you have any difficulty performing the following activities: 02/24/2020  Hearing? N  Comment  declines hearing aids  Vision? N  Difficulty concentrating or making decisions? N  Walking or climbing stairs? N  Dressing or bathing? N  Doing errands, shopping? N  Preparing Food and eating ? N  Using the Toilet? N  In the past six months, have you accidently leaked urine? N  Do you have problems with loss of bowel control? N  Managing your Medications? N  Managing your Finances? N  Housekeeping or managing your Housekeeping? N  Some recent data might be hidden    Patient Care Team: Glean Hess, MD as PCP - General (Internal Medicine) Reche Dixon, PA-C (Orthopedic Surgery) Anne Hahn, MD as Referring Physician (Family Medicine) Leanor Kail, MD (Inactive) (Orthopedic Surgery)  Indicate any recent Medical Services you may have received from other than [facility] providers in the past year (date may be approximate).     Assessment:   This is a routine wellness examination for Yumi.  Hearing/Vision screen  Hearing Screening   125Hz  250Hz  500Hz  1000Hz  2000Hz  3000Hz  4000Hz  6000Hz  8000Hz   Right ear:           Left ear:           Comments: Pt denies hearing difficulty  Vision Screening Comments: [facility] screen[facility]  Dietary issues and exercise activities discussed: Current Exercise Habits: The patient does not participate in regular exercise at present, Exercise limited by: orthopedic condition(s)  Goals    . Increase physical activity     Recommend increasing physical activity to at least 3 days per week      Depression Screen PHQ 2/9 Scores 02/24/2020 10/28/2019 05/24/2019 05/02/2019 05/02/2019 01/21/2019 10/25/2018  PHQ - 2 Score 1 1 0 0 0 1 1  PHQ- 9 Score - 4 0 0 - 3 3    Fall Risk Fall Risk  02/24/2020 10/28/2019 01/21/2019 10/25/2018 04/24/2018  Falls in the past year? 0 0 0 0 No  Number falls in past yr: 0 0 0 0 -  Injury with Fall? 0 0 0 0 -  Risk for fall due to : Orthopedic patient No Fall Risks;History of fall(s) - - -  Follow  up Falls prevention discussed Falls evaluation completed Falls prevention discussed - -    Any stairs in or around the home? Yes  If so, are  there any without handrails? No  Home free of loose throw rugs in walkways, pet beds, electrical cords, etc? Yes  Adequate lighting in your home to reduce risk of falls? Yes   ASSISTIVE DEVICES UTILIZED TO PREVENT FALLS:  Life alert? No  Use of a cane, walker or w/c? No  Grab bars in the bathroom? Yes Shower chair or bench in shower? No  Elevated toilet seat or a handicapped toilet? No   TIMED UP AND GO:  Was the test performed? No . Telephonic visit.   Cognitive Function: 6CIT deferred for 2021 AWV; pt states no memory issues     6CIT Screen 01/21/2019 04/10/2017 04/11/2016  What Year? 0 points 0 points 0 points  What month? 0 points 0 points 0 points  What time? 0 points 0 points 0 points  Count back from 20 0 points 0 points 0 points  Months in reverse 0 points 0 points 0 points  Repeat phrase 0 points 2 points 0 points  Total Score 0 2 0    Immunizations Immunization History  Administered Date(s) Administered  . Fluad Quad(high Dose 65+) 04/01/2019  . Influenza, High Dose Seasonal PF 04/10/2017  . Influenza,inj,Quad PF,6+ Mos 04/11/2016  . Influenza,inj,quad, With Preservative 05/18/2018  . Influenza-Unspecified 08/01/2011, 04/02/2014  . PFIZER SARS-COV-2 Vaccination 08/11/2019, 08/31/2019  . Pneumococcal Conjugate-13 11/27/2014  . Pneumococcal Polysaccharide-23 04/11/2016  . Tdap 08/14/2013  . Zoster 08/14/2013    TDAP status: Up to date   Flu Vaccine status: Up to date   Pneumococcal vaccine status: Up to date   Covid-19 vaccine status: Completed vaccines  Qualifies for Shingles Vaccine? Yes   Zostavax completed Yes   Shingrix Completed?: No.    Education has been provided regarding the importance of this vaccine. Patient has been advised to call insurance company to determine out of pocket expense if they have not yet  received this vaccine. Advised may also receive vaccine at local pharmacy or Health Dept. Verbalized acceptance and understanding.  Screening Tests Health Maintenance  Topic Date Due  . MAMMOGRAM  08/15/2019  . INFLUENZA VACCINE  02/16/2020  . COLONOSCOPY  07/21/2022  . TETANUS/TDAP  08/15/2023  . DEXA SCAN  Completed  . COVID-19 Vaccine  Completed  . PNA vac Low Risk Adult  Completed  . Hepatitis C Screening  Addressed    Health Maintenance  Health Maintenance Due  Topic Date Due  . MAMMOGRAM  08/15/2019  . INFLUENZA VACCINE  02/16/2020    Colorectal cancer screening: Completed 07/31/17. Repeat every 5 years   Mammogram status: Completed 08/14/18. Repeat every year   Bone Density status: Completed 05/25/17. Results reflect: Bone density results: NORMAL. Repeat every 2 years.  Lung Cancer Screening: (Low Dose CT Chest recommended if Age 10-80 years, 30 pack-year currently smoking OR have quit w/in 15years.) does not qualify.   Additional Screening:  Hepatitis C Screening: does qualify; Completed 04/11/16  Vision Screening: Recommended annual ophthalmology exams for early detection of glaucoma and other disorders of the eye. Is the patient up to date with their annual eye exam?  Yes  Who is the provider or what is the name of the office in which the patient attends annual eye exams? Coaling Screening: Recommended annual dental exams for proper oral hygiene  Community Resource Referral / Chronic Care Management: CRR required this visit?  No   CCM required this visit?  No      Plan:     I have personally  reviewed and noted the following in the patient's chart:   . Medical and social history . Use of alcohol, tobacco or illicit drugs  . Current medications and supplements . Functional ability and status . Nutritional status . Physical activity . Advanced directives . List of other physicians . Hospitalizations, surgeries, and ER visits in  previous 12 months . Vitals . Screenings to include cognitive, depression, and falls . Referrals and appointments  In addition, I have reviewed and discussed with patient certain preventive protocols, quality metrics, and best practice recommendations. A written personalized care plan for preventive services as well as general preventive health recommendations were provided to patient.     Clemetine Marker, LPN   07/22/9468   Nurse Notes: pt doing well and appreciative of visit today

## 2020-03-03 DIAGNOSIS — M5136 Other intervertebral disc degeneration, lumbar region: Secondary | ICD-10-CM | POA: Diagnosis not present

## 2020-03-03 DIAGNOSIS — M48062 Spinal stenosis, lumbar region with neurogenic claudication: Secondary | ICD-10-CM | POA: Diagnosis not present

## 2020-03-03 DIAGNOSIS — M5416 Radiculopathy, lumbar region: Secondary | ICD-10-CM | POA: Diagnosis not present

## 2020-03-10 ENCOUNTER — Ambulatory Visit: Payer: Medicare HMO

## 2020-03-10 ENCOUNTER — Other Ambulatory Visit: Payer: Medicare HMO

## 2020-03-10 DIAGNOSIS — H40003 Preglaucoma, unspecified, bilateral: Secondary | ICD-10-CM | POA: Diagnosis not present

## 2020-03-17 ENCOUNTER — Other Ambulatory Visit: Payer: Self-pay

## 2020-03-17 ENCOUNTER — Ambulatory Visit
Admission: RE | Admit: 2020-03-17 | Discharge: 2020-03-17 | Disposition: A | Discharge status: Home / Self Care | Payer: Medicare HMO | Source: Ambulatory Visit | Attending: Internal Medicine | Admitting: Internal Medicine

## 2020-03-17 DIAGNOSIS — Z1231 Encounter for screening mammogram for malignant neoplasm of breast: Secondary | ICD-10-CM | POA: Diagnosis not present

## 2020-03-17 DIAGNOSIS — Z13820 Encounter for screening for osteoporosis: Secondary | ICD-10-CM | POA: Diagnosis not present

## 2020-03-17 DIAGNOSIS — Z780 Asymptomatic menopausal state: Secondary | ICD-10-CM | POA: Insufficient documentation

## 2020-03-17 DIAGNOSIS — R29890 Loss of height: Secondary | ICD-10-CM | POA: Diagnosis not present

## 2020-03-26 DIAGNOSIS — M5136 Other intervertebral disc degeneration, lumbar region: Secondary | ICD-10-CM | POA: Diagnosis not present

## 2020-03-26 DIAGNOSIS — M5416 Radiculopathy, lumbar region: Secondary | ICD-10-CM | POA: Diagnosis not present

## 2020-03-26 DIAGNOSIS — M48062 Spinal stenosis, lumbar region with neurogenic claudication: Secondary | ICD-10-CM | POA: Diagnosis not present

## 2020-03-26 DIAGNOSIS — M5030 Other cervical disc degeneration, unspecified cervical region: Secondary | ICD-10-CM | POA: Diagnosis not present

## 2020-03-26 DIAGNOSIS — M5412 Radiculopathy, cervical region: Secondary | ICD-10-CM | POA: Diagnosis not present

## 2020-04-14 DIAGNOSIS — M5412 Radiculopathy, cervical region: Secondary | ICD-10-CM | POA: Diagnosis not present

## 2020-04-14 DIAGNOSIS — M5030 Other cervical disc degeneration, unspecified cervical region: Secondary | ICD-10-CM | POA: Diagnosis not present

## 2020-04-29 ENCOUNTER — Ambulatory Visit (INDEPENDENT_AMBULATORY_CARE_PROVIDER_SITE_OTHER): Payer: Medicare HMO | Admitting: Internal Medicine

## 2020-04-29 ENCOUNTER — Encounter: Payer: Self-pay | Admitting: Internal Medicine

## 2020-04-29 ENCOUNTER — Other Ambulatory Visit: Payer: Self-pay

## 2020-04-29 VITALS — BP 132/64 | HR 104 | Temp 98.0°F | Ht 61.0 in | Wt 223.0 lb

## 2020-04-29 DIAGNOSIS — F3341 Major depressive disorder, recurrent, in partial remission: Secondary | ICD-10-CM | POA: Diagnosis not present

## 2020-04-29 DIAGNOSIS — M5412 Radiculopathy, cervical region: Secondary | ICD-10-CM | POA: Diagnosis not present

## 2020-04-29 DIAGNOSIS — I10 Essential (primary) hypertension: Secondary | ICD-10-CM

## 2020-04-29 DIAGNOSIS — M5416 Radiculopathy, lumbar region: Secondary | ICD-10-CM | POA: Insufficient documentation

## 2020-04-29 DIAGNOSIS — E038 Other specified hypothyroidism: Secondary | ICD-10-CM | POA: Diagnosis not present

## 2020-04-29 DIAGNOSIS — E063 Autoimmune thyroiditis: Secondary | ICD-10-CM | POA: Diagnosis not present

## 2020-04-29 MED ORDER — HYDROCHLOROTHIAZIDE 25 MG PO TABS: 25.0000 mg | ORAL_TABLET | Freq: Every day | ORAL | 12 refills | Status: DC

## 2020-04-29 MED ORDER — LISINOPRIL 20 MG PO TABS: 20.0000 mg | ORAL_TABLET | Freq: Every day | ORAL | 12 refills | Status: DC

## 2020-04-29 MED ORDER — FLUOXETINE HCL 20 MG PO CAPS: 20.0000 mg | ORAL_CAPSULE | Freq: Every day | ORAL | 12 refills | Status: DC

## 2020-04-29 NOTE — Progress Notes (Signed)
Date:  04/29/2020   Name:  Deanna Johnson   DOB:  07/20/1969   MRN:  [id number]   Chief Complaint: Hypertension Recently had ESI of cervical spine with Dr. Sharlet Salina.  Started on gabapentin qhs.  Hypertension This is a chronic problem. The problem is controlled. Associated symptoms include neck pain. Pertinent negatives include no chest pain, headaches, palpitations or shortness of breath. Past treatments include ACE inhibitors and diuretics. The current treatment provides significant improvement. There are no compliance problems.  Identifiable causes of hypertension include a thyroid problem.  Thyroid Problem Presents for follow-up (slightly over supplemented last visit) visit. Patient reports no anxiety, fatigue or palpitations. The symptoms have been stable.  Neck Pain  This is a chronic problem. The problem has been gradually improving. The pain is present in the midline. The pain is moderate. Pertinent negatives include no chest pain, fever or headaches. She has tried muscle relaxants (ESI and gabapentin) for the symptoms. The treatment provided moderate relief.    Lab Results  Component Value Date   CREATININE 0.87 10/28/2019   BUN 14 10/28/2019   NA 143 10/28/2019   K 4.9 10/28/2019   CL 99 10/28/2019   CO2 25 10/28/2019   Lab Results  Component Value Date   CHOL 237 (H) 10/28/2019   HDL 70 10/28/2019   LDLCALC 142 (H) 10/28/2019   TRIG 144 10/28/2019   CHOLHDL 3.4 10/28/2019   Lab Results  Component Value Date   TSH <0.005 (L) 10/28/2019   No results found for: HGBA1C Lab Results  Component Value Date   WBC 5.4 10/28/2019   HGB 13.5 10/28/2019   HCT 40.3 10/28/2019   MCV 83 10/28/2019   PLT 289 10/28/2019   Lab Results  Component Value Date   ALT 12 10/28/2019   AST 14 10/28/2019   ALKPHOS 73 10/28/2019   BILITOT 0.6 10/28/2019     Review of Systems  Constitutional: Negative for chills, fatigue and fever.  Respiratory: Negative for chest tightness  and shortness of breath.   Cardiovascular: Negative for chest pain, palpitations and leg swelling.  Musculoskeletal: Positive for back pain and neck pain.  Neurological: Negative for dizziness and headaches.  Psychiatric/Behavioral: Negative for dysphoric mood and sleep disturbance. The patient is not nervous/anxious.     Patient Active Problem List   Diagnosis Date Noted  . Lumbar radiculitis 04/29/2020  . Cervical radiculopathy 10/25/2018  . BMI 40.0-44.9, adult ([place]) 06/27/2018  . Hyperlipidemia, mixed 05/05/2017  . Hypothyroidism due to Hashimoto's thyroiditis 05/05/2017  . Recurrent major depression in partial remission ([place]) 04/11/2016  . Hx of colonic polyp 04/11/2016  . Arthritis 12/31/2014  . Essential hypertension 12/31/2014  . Impingement syndrome of left shoulder 12/16/2014  . H/O fibromyalgia 08/18/2012    No Known Allergies  Past Surgical History:  Procedure Laterality Date  . BREAST BIOPSY Right 1987   neg  . BREAST SURGERY     RIGHT BREAST BIOPSY  . BUNIONECTOMY    . CATARACT EXTRACTION W/PHACO Left 01/04/2019   Procedure: CATARACT EXTRACTION PHACO AND INTRAOCULAR LENS PLACEMENT ([place])  LEFT;  Surgeon: Eulogio Bear, MD;  Location: [place];  Service: Ophthalmology;  Laterality: Left;  . CATARACT EXTRACTION W/PHACO Right 02/01/2019   Procedure: CATARACT EXTRACTION PHACO AND INTRAOCULAR LENS PLACEMENT ([place])  RIGHT;  Surgeon: Eulogio Bear, MD;  Location: [place];  Service: Ophthalmology;  Laterality: Right;  . COLONOSCOPY  2011   benign polyps - f/u 2016  .  COLONOSCOPY WITH PROPOFOL N/A 07/21/2017   Procedure: COLONOSCOPY WITH PROPOFOL;  Surgeon: Lollie Sails, MD;  Location[facility] ENDOSCOPY;  Service: Endoscopy;  Laterality: N/A;  . FRACTURE SURGERY     MAXILLARY FACIAL SURGERY  . MANDIBLE FRACTURE SURGERY     jaw resection  . shoulder arthroscopy Right   . SHOULDER ARTHROSCOPY WITH OPEN ROTATOR CUFF REPAIR Left 01/30/2015    Procedure: SHOULDER ARTHROSCOPY WITH POSS OPEN ROTATOR CUFF REPAIR;  Surgeon: Leanor Kail, MD;  Location: [place];  Service: Orthopedics;  Laterality: Left;  Release of long head of biceps tendon Subacromial decompression Mini open rotator cuff repair  . TONSILLECTOMY    . TOTAL ABDOMINAL HYSTERECTOMY    . TUBAL LIGATION  1978    Social History   Tobacco Use  . Smoking status: Former Smoker    Packs/day: 0.25    Years: 2.00    Pack years: 0.50    Types: Cigarettes    Quit date: 1972    Years since quitting: 49.8  . Smokeless tobacco: Never Used  . Tobacco comment: quit at age 69  Vaping Use  . Vaping Use: Never used  Substance Use Topics  . Alcohol use: No    Alcohol/week: 0.0 standard drinks  . Drug use: No     Medication list has been reviewed and updated.  Current Meds  Medication Sig  . celecoxib (CELEBREX) 200 MG capsule Take 1 capsule by mouth as needed.   . Cholecalciferol 2000 units TABS Take 6,000 Units by mouth daily.   . cyclobenzaprine (FLEXERIL) 10 MG tablet Take 1 tablet by mouth as needed.   Marland Kitchen FLUoxetine (PROZAC) 20 MG capsule Take 1 capsule (20 mg total) by mouth daily.  . fluticasone (FLONASE) 50 MCG/ACT nasal spray Place 2 sprays into both nostrils daily.  Marland Kitchen gabapentin (NEURONTIN) 100 MG capsule Take 1 capsule by mouth at bedtime.  . hydrochlorothiazide (HYDRODIURIL) 25 MG tablet Take 1 tablet (25 mg total) by mouth daily.  Marland Kitchen lisinopril (ZESTRIL) 20 MG tablet Take 1 tablet (20 mg total) by mouth daily.  . magnesium 30 MG tablet Take 30 mg by mouth 2 (two) times daily.  . Multiple Vitamins-Minerals (CENTRUM SILVER PO) Take by mouth.  . NONFORMULARY OR COMPOUNDED ITEM 152 mcg T4/ 36 mcg T3  . zinc gluconate 50 MG tablet Take 50 mg by mouth daily.    PHQ 2/9 Scores 04/29/2020 02/24/2020 10/28/2019 05/24/2019  PHQ - 2 Score 0 1 1 0  PHQ- 9 Score 0 - 4 0    GAD 7 : Generalized Anxiety Score 04/29/2020 10/28/2019  Nervous, Anxious, on Edge 0  0  Control/stop worrying 0 0  Worry too much - different things 0 0  Trouble relaxing 0 0  Restless 0 0  Easily annoyed or irritable 0 0  Afraid - awful might happen 0 0  Total GAD 7 Score 0 0  Anxiety Difficulty Not difficult at all Not difficult at all    BP Readings from Last 3 Encounters:  04/29/20 132/64  10/28/19 128/78  05/02/19 124/70    Physical Exam Vitals and nursing note reviewed.  Constitutional:      General: She is not in acute distress.    Appearance: She is well-developed.  HENT:     Head: Normocephalic and atraumatic.  Neck:     Vascular: No carotid bruit.  Cardiovascular:     Rate and Rhythm: Normal rate and regular rhythm.     Pulses: Normal pulses.  Heart sounds: No murmur heard.   Pulmonary:     Effort: Pulmonary effort is normal. No respiratory distress.     Breath sounds: No wheezing or rhonchi.  Musculoskeletal:     Cervical back: Normal range of motion.     Right lower leg: No edema.     Left lower leg: No edema.  Lymphadenopathy:     Cervical: No cervical adenopathy.  Skin:    General: Skin is warm and dry.     Findings: No rash.  Neurological:     General: No focal deficit present.     Mental Status: She is alert and oriented to person, place, and time.  Psychiatric:        Mood and Affect: Mood normal.     Wt Readings from Last 3 Encounters:  04/29/20 223 lb (101.2 kg)  10/28/19 228 lb (103.4 kg)  05/24/19 221 lb (100.2 kg)    BP 132/64 (BP Location: Right Arm, Patient Position: Sitting)   Pulse (!) 104   Temp 98 F (36.7 C) (Oral)   Ht 5\' 1"  (1.549 m)   Wt 223 lb (101.2 kg)   SpO2 96%   BMI 42.14 kg/m   Assessment and Plan: 1. Essential hypertension Clinically stable exam with well controlled BP on lisinopril and hctz. Tolerating medications without side effects at this time. Pt to continue current regimen and low sodium diet; benefits of regular exercise as able discussed. - lisinopril (ZESTRIL) 20 MG tablet;  Take 1 tablet (20 mg total) by mouth daily.  Dispense: 30 tablet; Refill: 12 - hydrochlorothiazide (HYDRODIURIL) 25 MG tablet; Take 1 tablet (25 mg total) by mouth daily.  Dispense: 30 tablet; Refill: 12  2. Hypothyroidism due to Hashimoto's thyroiditis Supplemented with compounded medication - 2 tabs 5 days a week and 1 tab M and Th. - TSH+T4F+T3Free  3. Cervical radiculopathy Improving Continue ESI with Dr. Sharlet Salina and gabapentin  4. Lumbar radiculitis  5. Recurrent major depression in partial remission (HCC) Clinically stable on current regimen with good control of symptoms, No SI or HI. Will continue current therapy. - FLUoxetine (PROZAC) 20 MG capsule; Take 1 capsule (20 mg total) by mouth daily.  Dispense: 30 capsule; Refill: 12   Partially dictated using Editor, commissioning. Any errors are unintentional.  Halina Maidens, MD [facility]  04/29/2020

## 2020-04-30 LAB — TSH+T4F+T3FREE
Free T4: 1.42 ng/dL (ref 0.82–1.77)
T3, Free: 5.7 pg/mL — ABNORMAL HIGH (ref 2.0–4.4)
TSH: <0.005 u[IU]/mL — ABNORMAL LOW (ref 0.450–4.500)

## 2020-05-05 DIAGNOSIS — M5412 Radiculopathy, cervical region: Secondary | ICD-10-CM | POA: Diagnosis not present

## 2020-05-05 DIAGNOSIS — M5030 Other cervical disc degeneration, unspecified cervical region: Secondary | ICD-10-CM | POA: Diagnosis not present

## 2020-05-21 DIAGNOSIS — Z20822 Contact with and (suspected) exposure to covid-19: Secondary | ICD-10-CM | POA: Diagnosis not present

## 2020-06-17 ENCOUNTER — Telehealth: Payer: Self-pay

## 2020-06-17 NOTE — Telephone Encounter (Signed)
Copied from [place] [id number]. Topic: General - Other >> Jun 17, 2020  1:05 PM Rainey Pines A wrote: Pharmacy stated that they have sent out 2 fax requests for patients T4/T3  and havent gotten a response back and would like request expedited. Please advise

## 2020-06-17 NOTE — Telephone Encounter (Signed)
[facility] Compounding and let them know on VM we have not received this FAX yet. Gave them patient information and FAX number for our office to fax over order for patient RX so Dr. Army Melia can sign this and we can Fax back.

## 2020-06-19 DIAGNOSIS — M5136 Other intervertebral disc degeneration, lumbar region: Secondary | ICD-10-CM | POA: Diagnosis not present

## 2020-06-19 DIAGNOSIS — M48062 Spinal stenosis, lumbar region with neurogenic claudication: Secondary | ICD-10-CM | POA: Diagnosis not present

## 2020-06-19 DIAGNOSIS — M5416 Radiculopathy, lumbar region: Secondary | ICD-10-CM | POA: Diagnosis not present

## 2020-07-27 DIAGNOSIS — M4802 Spinal stenosis, cervical region: Secondary | ICD-10-CM | POA: Diagnosis not present

## 2020-07-27 DIAGNOSIS — M5412 Radiculopathy, cervical region: Secondary | ICD-10-CM | POA: Diagnosis not present

## 2020-07-27 DIAGNOSIS — M5030 Other cervical disc degeneration, unspecified cervical region: Secondary | ICD-10-CM | POA: Diagnosis not present

## 2020-08-14 DIAGNOSIS — Z6841 Body Mass Index (BMI) 40.0 and over, adult: Secondary | ICD-10-CM | POA: Diagnosis not present

## 2020-08-14 DIAGNOSIS — M5416 Radiculopathy, lumbar region: Secondary | ICD-10-CM | POA: Diagnosis not present

## 2020-08-14 DIAGNOSIS — M4807 Spinal stenosis, lumbosacral region: Secondary | ICD-10-CM | POA: Diagnosis not present

## 2020-09-22 DIAGNOSIS — M5440 Lumbago with sciatica, unspecified side: Secondary | ICD-10-CM | POA: Diagnosis not present

## 2020-09-22 DIAGNOSIS — M542 Cervicalgia: Secondary | ICD-10-CM | POA: Diagnosis not present

## 2020-09-22 DIAGNOSIS — M47812 Spondylosis without myelopathy or radiculopathy, cervical region: Secondary | ICD-10-CM | POA: Diagnosis not present

## 2020-09-22 DIAGNOSIS — M47816 Spondylosis without myelopathy or radiculopathy, lumbar region: Secondary | ICD-10-CM | POA: Diagnosis not present

## 2020-09-22 DIAGNOSIS — M5136 Other intervertebral disc degeneration, lumbar region: Secondary | ICD-10-CM | POA: Diagnosis not present

## 2020-09-22 DIAGNOSIS — G8929 Other chronic pain: Secondary | ICD-10-CM | POA: Diagnosis not present

## 2020-10-05 DIAGNOSIS — M545 Low back pain, unspecified: Secondary | ICD-10-CM | POA: Diagnosis not present

## 2020-10-05 DIAGNOSIS — M5441 Lumbago with sciatica, right side: Secondary | ICD-10-CM | POA: Diagnosis not present

## 2020-10-05 DIAGNOSIS — G8929 Other chronic pain: Secondary | ICD-10-CM | POA: Diagnosis not present

## 2020-10-05 DIAGNOSIS — I10 Essential (primary) hypertension: Secondary | ICD-10-CM | POA: Diagnosis not present

## 2020-10-05 DIAGNOSIS — F32A Depression, unspecified: Secondary | ICD-10-CM | POA: Diagnosis not present

## 2020-10-05 DIAGNOSIS — M542 Cervicalgia: Secondary | ICD-10-CM | POA: Diagnosis not present

## 2020-10-05 DIAGNOSIS — M48061 Spinal stenosis, lumbar region without neurogenic claudication: Secondary | ICD-10-CM | POA: Diagnosis not present

## 2020-10-05 DIAGNOSIS — M5440 Lumbago with sciatica, unspecified side: Secondary | ICD-10-CM | POA: Diagnosis not present

## 2020-10-05 DIAGNOSIS — E079 Disorder of thyroid, unspecified: Secondary | ICD-10-CM | POA: Diagnosis not present

## 2020-10-05 DIAGNOSIS — M1990 Unspecified osteoarthritis, unspecified site: Secondary | ICD-10-CM | POA: Diagnosis not present

## 2020-10-05 DIAGNOSIS — M5442 Lumbago with sciatica, left side: Secondary | ICD-10-CM | POA: Diagnosis not present

## 2020-10-26 DIAGNOSIS — F32A Depression, unspecified: Secondary | ICD-10-CM | POA: Diagnosis not present

## 2020-10-26 DIAGNOSIS — G8929 Other chronic pain: Secondary | ICD-10-CM | POA: Diagnosis not present

## 2020-10-26 DIAGNOSIS — M5440 Lumbago with sciatica, unspecified side: Secondary | ICD-10-CM | POA: Diagnosis not present

## 2020-10-26 DIAGNOSIS — Z79899 Other long term (current) drug therapy: Secondary | ICD-10-CM | POA: Diagnosis not present

## 2020-10-26 DIAGNOSIS — I10 Essential (primary) hypertension: Secondary | ICD-10-CM | POA: Diagnosis not present

## 2020-10-26 DIAGNOSIS — E079 Disorder of thyroid, unspecified: Secondary | ICD-10-CM | POA: Diagnosis not present

## 2020-10-26 DIAGNOSIS — M48061 Spinal stenosis, lumbar region without neurogenic claudication: Secondary | ICD-10-CM | POA: Diagnosis not present

## 2020-11-02 ENCOUNTER — Encounter: Payer: Medicare HMO | Admitting: Internal Medicine

## 2020-11-02 DIAGNOSIS — M48061 Spinal stenosis, lumbar region without neurogenic claudication: Secondary | ICD-10-CM | POA: Diagnosis not present

## 2020-11-02 DIAGNOSIS — G8929 Other chronic pain: Secondary | ICD-10-CM | POA: Diagnosis not present

## 2020-11-02 DIAGNOSIS — M542 Cervicalgia: Secondary | ICD-10-CM | POA: Diagnosis not present

## 2020-11-02 DIAGNOSIS — I10 Essential (primary) hypertension: Secondary | ICD-10-CM | POA: Diagnosis not present

## 2020-11-02 DIAGNOSIS — M5440 Lumbago with sciatica, unspecified side: Secondary | ICD-10-CM | POA: Diagnosis not present

## 2020-11-09 DIAGNOSIS — M48061 Spinal stenosis, lumbar region without neurogenic claudication: Secondary | ICD-10-CM | POA: Diagnosis not present

## 2020-11-09 DIAGNOSIS — M5441 Lumbago with sciatica, right side: Secondary | ICD-10-CM | POA: Diagnosis not present

## 2020-11-09 DIAGNOSIS — G8929 Other chronic pain: Secondary | ICD-10-CM | POA: Diagnosis not present

## 2020-11-23 ENCOUNTER — Other Ambulatory Visit: Payer: Self-pay | Admitting: Internal Medicine

## 2020-11-24 DIAGNOSIS — F3289 Other specified depressive episodes: Secondary | ICD-10-CM | POA: Diagnosis not present

## 2020-12-04 ENCOUNTER — Encounter: Payer: Self-pay | Admitting: Internal Medicine

## 2020-12-04 ENCOUNTER — Other Ambulatory Visit: Payer: Self-pay | Admitting: Internal Medicine

## 2020-12-04 DIAGNOSIS — E038 Other specified hypothyroidism: Secondary | ICD-10-CM

## 2020-12-08 DIAGNOSIS — M47812 Spondylosis without myelopathy or radiculopathy, cervical region: Secondary | ICD-10-CM | POA: Diagnosis not present

## 2020-12-08 DIAGNOSIS — M5440 Lumbago with sciatica, unspecified side: Secondary | ICD-10-CM | POA: Diagnosis not present

## 2020-12-08 DIAGNOSIS — F32A Depression, unspecified: Secondary | ICD-10-CM | POA: Diagnosis not present

## 2020-12-08 DIAGNOSIS — G8929 Other chronic pain: Secondary | ICD-10-CM | POA: Diagnosis not present

## 2020-12-08 DIAGNOSIS — M1990 Unspecified osteoarthritis, unspecified site: Secondary | ICD-10-CM | POA: Diagnosis not present

## 2020-12-08 DIAGNOSIS — M4802 Spinal stenosis, cervical region: Secondary | ICD-10-CM | POA: Diagnosis not present

## 2020-12-08 DIAGNOSIS — I10 Essential (primary) hypertension: Secondary | ICD-10-CM | POA: Diagnosis not present

## 2020-12-08 DIAGNOSIS — M48061 Spinal stenosis, lumbar region without neurogenic claudication: Secondary | ICD-10-CM | POA: Diagnosis not present

## 2020-12-08 DIAGNOSIS — E079 Disorder of thyroid, unspecified: Secondary | ICD-10-CM | POA: Diagnosis not present

## 2020-12-08 DIAGNOSIS — M5441 Lumbago with sciatica, right side: Secondary | ICD-10-CM | POA: Diagnosis not present

## 2020-12-08 DIAGNOSIS — M2578 Osteophyte, vertebrae: Secondary | ICD-10-CM | POA: Diagnosis not present

## 2020-12-10 DIAGNOSIS — E038 Other specified hypothyroidism: Secondary | ICD-10-CM | POA: Diagnosis not present

## 2020-12-10 DIAGNOSIS — E063 Autoimmune thyroiditis: Secondary | ICD-10-CM | POA: Diagnosis not present

## 2020-12-11 LAB — TSH+T4F+T3FREE
Free T4: 0.79 ng/dL — ABNORMAL LOW (ref 0.82–1.77)
T3, Free: 1.5 pg/mL — ABNORMAL LOW (ref 2.0–4.4)
TSH: 0.08 u[IU]/mL — ABNORMAL LOW (ref 0.450–4.500)

## 2020-12-15 NOTE — Telephone Encounter (Signed)
Pt response.  KP

## 2020-12-18 DIAGNOSIS — M47812 Spondylosis without myelopathy or radiculopathy, cervical region: Secondary | ICD-10-CM | POA: Diagnosis not present

## 2020-12-18 DIAGNOSIS — M4802 Spinal stenosis, cervical region: Secondary | ICD-10-CM | POA: Diagnosis not present

## 2020-12-24 DIAGNOSIS — M47812 Spondylosis without myelopathy or radiculopathy, cervical region: Secondary | ICD-10-CM | POA: Diagnosis not present

## 2020-12-24 DIAGNOSIS — M4802 Spinal stenosis, cervical region: Secondary | ICD-10-CM | POA: Diagnosis not present

## 2020-12-24 DIAGNOSIS — M4712 Other spondylosis with myelopathy, cervical region: Secondary | ICD-10-CM | POA: Diagnosis not present

## 2021-01-21 DIAGNOSIS — E079 Disorder of thyroid, unspecified: Secondary | ICD-10-CM | POA: Diagnosis not present

## 2021-01-21 DIAGNOSIS — M5442 Lumbago with sciatica, left side: Secondary | ICD-10-CM | POA: Diagnosis not present

## 2021-01-21 DIAGNOSIS — M48061 Spinal stenosis, lumbar region without neurogenic claudication: Secondary | ICD-10-CM | POA: Diagnosis not present

## 2021-01-21 DIAGNOSIS — G8929 Other chronic pain: Secondary | ICD-10-CM | POA: Diagnosis not present

## 2021-01-21 DIAGNOSIS — I10 Essential (primary) hypertension: Secondary | ICD-10-CM | POA: Diagnosis not present

## 2021-01-21 DIAGNOSIS — F32A Depression, unspecified: Secondary | ICD-10-CM | POA: Diagnosis not present

## 2021-01-21 DIAGNOSIS — M5440 Lumbago with sciatica, unspecified side: Secondary | ICD-10-CM | POA: Diagnosis not present

## 2021-01-21 DIAGNOSIS — M1990 Unspecified osteoarthritis, unspecified site: Secondary | ICD-10-CM | POA: Diagnosis not present

## 2021-01-22 ENCOUNTER — Other Ambulatory Visit: Payer: Self-pay

## 2021-01-22 ENCOUNTER — Ambulatory Visit (INDEPENDENT_AMBULATORY_CARE_PROVIDER_SITE_OTHER): Payer: Medicare HMO | Admitting: Internal Medicine

## 2021-01-22 ENCOUNTER — Encounter: Payer: Self-pay | Admitting: Internal Medicine

## 2021-01-22 VITALS — BP 138/78 | HR 94 | Temp 98.3°F | Ht 61.0 in | Wt 221.0 lb

## 2021-01-22 DIAGNOSIS — E782 Mixed hyperlipidemia: Secondary | ICD-10-CM | POA: Diagnosis not present

## 2021-01-22 DIAGNOSIS — Z0000 Encounter for general adult medical examination without abnormal findings: Secondary | ICD-10-CM | POA: Diagnosis not present

## 2021-01-22 DIAGNOSIS — E038 Other specified hypothyroidism: Secondary | ICD-10-CM

## 2021-01-22 DIAGNOSIS — Z6841 Body Mass Index (BMI) 40.0 and over, adult: Secondary | ICD-10-CM

## 2021-01-22 DIAGNOSIS — M4712 Other spondylosis with myelopathy, cervical region: Secondary | ICD-10-CM

## 2021-01-22 DIAGNOSIS — Z1231 Encounter for screening mammogram for malignant neoplasm of breast: Secondary | ICD-10-CM

## 2021-01-22 DIAGNOSIS — I10 Essential (primary) hypertension: Secondary | ICD-10-CM | POA: Diagnosis not present

## 2021-01-22 DIAGNOSIS — F3341 Major depressive disorder, recurrent, in partial remission: Secondary | ICD-10-CM

## 2021-01-22 DIAGNOSIS — E063 Autoimmune thyroiditis: Secondary | ICD-10-CM

## 2021-01-22 LAB — POCT URINALYSIS DIPSTICK
Bilirubin, UA: NEGATIVE
Blood, UA: NEGATIVE
Glucose, UA: NEGATIVE
Ketones, UA: NEGATIVE
Leukocytes, UA: NEGATIVE
Nitrite, UA: NEGATIVE
Protein, UA: NEGATIVE
Spec Grav, UA: 1.02 (ref 1.010–1.025)
Urobilinogen, UA: 0.2 E.U./dL
pH, UA: 5 (ref 5.0–8.0)

## 2021-01-22 NOTE — Progress Notes (Signed)
Date:  01/22/2021   Name:  Deanna Johnson   DOB:  [birth year]   MRN:  [id number]   Chief Complaint: Annual Exam (Breast exam no pap) Deanna Johnson is a 70 y.o. female who presents today for her Complete Annual Exam. She feels poorly. She reports exercising - none. She reports she is sleeping fairly well. Breast complaints - none.  She is anticipating cervical spine surgery in the near future.  Mammogram: 02/2020 DEXA: 02/2020 Normal Colonoscopy: 07/2017  Immunization History  Administered Date(s) Administered   Fluad Quad(high Dose 65+) 04/01/2019   Influenza, High Dose Seasonal PF 04/10/2017   Influenza,inj,Quad PF,6+ Mos 04/11/2016   Influenza,inj,quad, With Preservative 05/18/2018   Influenza-Unspecified 08/01/2011, 04/02/2014, 04/06/2020   Moderna Sars-Covid-2 Vaccination 11/06/2020   PFIZER(Purple Top)SARS-COV-2 Vaccination 08/11/2019, 08/31/2019, 04/17/2020   Pneumococcal Conjugate-13 11/27/2014   Pneumococcal Polysaccharide-23 04/11/2016   Tdap 08/14/2013   Zoster, Live 08/14/2013    Hypertension This is a chronic problem. The problem is controlled. Associated symptoms include neck pain. Pertinent negatives include no chest pain, headaches, palpitations or shortness of breath. Past treatments include ACE inhibitors and diuretics. Identifiable causes of hypertension include a thyroid problem.  Depression        This is a chronic problem.  The problem has been resolved since onset.  Associated symptoms include no fatigue and no headaches.  Past treatments include SSRIs - Selective serotonin reuptake inhibitors.  Compliance with treatment is good.  Previous treatment provided significant relief.  Past medical history includes thyroid problem.   Hyperlipidemia This is a chronic problem. The problem is uncontrolled. Pertinent negatives include no chest pain or shortness of breath. Current antihyperlipidemic treatment includes diet change. The current treatment provides no improvement  of lipids.  Thyroid Problem Presents for follow-up (on compounded thyroid medication - cut down from 2 pills to 1 pill three months ago) visit. Patient reports no anxiety, constipation, diarrhea, fatigue, palpitations or tremors. The symptoms have been stable. Her past medical history is significant for hyperlipidemia.  Neck Pain  This is a chronic problem. The problem has been gradually worsening. The pain is present in the midline. The quality of the pain is described as burning and cramping. Associated symptoms include numbness (in both hands from HNP at C4-5). Pertinent negatives include no chest pain, fever, headaches or trouble swallowing.   Lab Results  Component Value Date   CREATININE 0.87 10/28/2019   BUN 14 10/28/2019   NA 143 10/28/2019   K 4.9 10/28/2019   CL 99 10/28/2019   CO2 25 10/28/2019   Lab Results  Component Value Date   CHOL 237 (H) 10/28/2019   HDL 70 10/28/2019   LDLCALC 142 (H) 10/28/2019   TRIG 144 10/28/2019   CHOLHDL 3.4 10/28/2019   Lab Results  Component Value Date   TSH 0.080 (L) 12/10/2020   No results found for: HGBA1C Lab Results  Component Value Date   WBC 5.4 10/28/2019   HGB 13.5 10/28/2019   HCT 40.3 10/28/2019   MCV 83 10/28/2019   PLT 289 10/28/2019   Lab Results  Component Value Date   ALT 12 10/28/2019   AST 14 10/28/2019   ALKPHOS 73 10/28/2019   BILITOT 0.6 10/28/2019     Review of Systems  Constitutional:  Negative for chills, fatigue and fever.  HENT:  Negative for congestion, hearing loss, tinnitus, trouble swallowing and voice change.   Eyes:  Negative for visual disturbance.  Respiratory:  Negative for cough, chest  tightness, shortness of breath and wheezing.   Cardiovascular:  Negative for chest pain, palpitations and leg swelling.  Gastrointestinal:  Negative for abdominal pain, constipation, diarrhea and vomiting.  Endocrine: Negative for polydipsia and polyuria.  Genitourinary:  Negative for dysuria, frequency,  genital sores, vaginal bleeding and vaginal discharge.  Musculoskeletal:  Positive for neck pain. Negative for arthralgias, gait problem and joint swelling.  Skin:  Negative for color change and rash.  Neurological:  Positive for numbness (in both hands from HNP at C4-5). Negative for dizziness, tremors, light-headedness and headaches.  Hematological:  Negative for adenopathy. Does not bruise/bleed easily.  Psychiatric/Behavioral:  Positive for depression. Negative for dysphoric mood and sleep disturbance. The patient is not nervous/anxious.    Patient Active Problem List   Diagnosis Date Noted   Lumbar radiculitis 04/29/2020   Cervical spondylosis with myelopathy 10/25/2018   BMI 40.0-44.9, adult ([place]) 06/27/2018   Hyperlipidemia, mixed 05/05/2017   Hypothyroidism due to Hashimoto's thyroiditis 05/05/2017   Recurrent major depression in partial remission ([place]) 04/11/2016   Hx of colonic polyp 04/11/2016   Arthritis 12/31/2014   Essential hypertension 12/31/2014   Impingement syndrome of left shoulder 12/16/2014   H/O fibromyalgia 08/18/2012    No Known Allergies  Past Surgical History:  Procedure Laterality Date   BREAST BIOPSY Right 1987   neg   BREAST SURGERY     RIGHT BREAST BIOPSY   BUNIONECTOMY     CATARACT EXTRACTION W/PHACO Left 01/04/2019   Procedure: CATARACT EXTRACTION PHACO AND INTRAOCULAR LENS PLACEMENT ([place])  LEFT;  Surgeon: Eulogio Bear, MD;  Location: [place];  Service: Ophthalmology;  Laterality: Left;   CATARACT EXTRACTION W/PHACO Right 02/01/2019   Procedure: CATARACT EXTRACTION PHACO AND INTRAOCULAR LENS PLACEMENT ([facility])  RIGHT;  Surgeon: Eulogio Bear, MD;  Location: [place];  Service: Ophthalmology;  Laterality: Right;   COLONOSCOPY  2011   benign polyps - f/u 2016   COLONOSCOPY WITH PROPOFOL N/A 07/21/2017   Procedure: COLONOSCOPY WITH PROPOFOL;  Surgeon: Lollie Sails, MD;  Location: [facility] ENDOSCOPY;  Service: Endoscopy;   Laterality: N/A;   FRACTURE SURGERY     MAXILLARY FACIAL SURGERY   MANDIBLE FRACTURE SURGERY     jaw resection   shoulder arthroscopy Right    SHOULDER ARTHROSCOPY WITH OPEN ROTATOR CUFF REPAIR Left 01/30/2015   Procedure: SHOULDER ARTHROSCOPY WITH POSS OPEN ROTATOR CUFF REPAIR;  Surgeon: Leanor Kail, MD;  Location: [place];  Service: Orthopedics;  Laterality: Left;  Release of long head of biceps tendon Subacromial decompression Mini open rotator cuff repair   TONSILLECTOMY     TOTAL ABDOMINAL HYSTERECTOMY     TUBAL LIGATION  1978    Social History   Tobacco Use   Smoking status: Former    Packs/day: 0.25    Years: 2.00    Pack years: 0.50    Types: Cigarettes    Quit date: 1972    Years since quitting: 50.5   Smokeless tobacco: Never   Tobacco comments:    quit at age 77  Vaping Use   Vaping Use: Never used  Substance Use Topics   Alcohol use: No    Alcohol/week: 0.0 standard drinks   Drug use: No     Medication list has been reviewed and updated.  Current Meds  Medication Sig   celecoxib (CELEBREX) 200 MG capsule Take by mouth.   FLUoxetine (PROZAC) 20 MG capsule Take 1 capsule by mouth daily.   fluticasone (FLONASE) 50 MCG/ACT  nasal spray SPRAY 2 SPRAYS INTO BOTH NOSTRILS DAILY   hydrochlorothiazide (HYDRODIURIL) 25 MG tablet Take 1 tablet (25 mg total) by mouth daily.   lisinopril (ZESTRIL) 20 MG tablet Take 1 tablet (20 mg total) by mouth daily.   Magnesium Gluconate 550 MG TABS Take by mouth.   Multiple Vitamins-Minerals (CENTRUM SILVER PO) Take by mouth.   NONFORMULARY OR COMPOUNDED ITEM 152 mcg T4/ 36 mcg T3   tiZANidine (ZANAFLEX) 4 MG tablet Take by mouth.   zinc gluconate 50 MG tablet Take 50 mg by mouth daily.   [DISCONTINUED] fluticasone (FLONASE) 50 MCG/ACT nasal spray Place into the nose.    PHQ 2/9 Scores 01/22/2021 04/29/2020 02/24/2020 10/28/2019  PHQ - 2 Score 2 0 1 1  PHQ- 9 Score 5 0 - 4    GAD 7 : Generalized Anxiety Score  01/22/2021 04/29/2020 10/28/2019  Nervous, Anxious, on Edge 0 0 0  Control/stop worrying 0 0 0  Worry too much - different things 0 0 0  Trouble relaxing 0 0 0  Restless 0 0 0  Easily annoyed or irritable 0 0 0  Afraid - awful might happen 0 0 0  Total GAD 7 Score 0 0 0  Anxiety Difficulty - Not difficult at all Not difficult at all    BP Readings from Last 3 Encounters:  01/22/21 138/78  04/29/20 132/64  10/28/19 128/78    Physical Exam Vitals and nursing note reviewed.  Constitutional:      General: She is not in acute distress.    Appearance: She is well-developed.  HENT:     Head: Normocephalic and atraumatic.     Right Ear: Tympanic membrane and ear canal normal.     Left Ear: Tympanic membrane and ear canal normal.     Nose:     Right Sinus: No maxillary sinus tenderness.     Left Sinus: No maxillary sinus tenderness.  Eyes:     General: No scleral icterus.       Right eye: No discharge.        Left eye: No discharge.     Conjunctiva/sclera: Conjunctivae normal.  Neck:     Thyroid: No thyromegaly.     Vascular: No carotid bruit.  Cardiovascular:     Rate and Rhythm: Normal rate and regular rhythm.     Pulses: Normal pulses.     Heart sounds: Normal heart sounds.  Pulmonary:     Effort: Pulmonary effort is normal. No respiratory distress.     Breath sounds: No wheezing.  Chest:  Breasts:    Right: No mass, nipple discharge, skin change or tenderness.     Left: No mass, nipple discharge, skin change or tenderness.  Abdominal:     General: Bowel sounds are normal.     Palpations: Abdomen is soft.     Tenderness: There is no abdominal tenderness.  Musculoskeletal:     Cervical back: Normal range of motion. No erythema.     Right lower leg: No edema.     Left lower leg: No edema.  Lymphadenopathy:     Cervical: No cervical adenopathy.  Skin:    General: Skin is warm and dry.     Findings: No rash.  Neurological:     Mental Status: She is alert and oriented  to person, place, and time.     Cranial Nerves: No cranial nerve deficit.     Sensory: Sensory deficit present.     Motor: No weakness.  Gait: Gait abnormal.     Deep Tendon Reflexes: Reflexes are normal and symmetric.  Psychiatric:        Attention and Perception: Attention normal.        Mood and Affect: Mood normal.    Wt Readings from Last 3 Encounters:  01/22/21 221 lb (100.2 kg)  04/29/20 223 lb (101.2 kg)  10/28/19 228 lb (103.4 kg)    BP 138/78 (BP Location: Right Arm, Patient Position: Sitting, Cuff Size: Large)   Pulse 94   Temp 98.3 F (36.8 C) (Oral)   Ht 5\' 1"  (1.549 m)   Wt 221 lb (100.2 kg)   SpO2 96%   BMI 41.76 kg/m   Assessment and Plan: 1. Annual physical exam Exam is normal except for weight. Encourage appropriate dietary changes.  2. Encounter for screening mammogram for breast cancer Due in August - pt will schedule at [place]; Future  3. Essential hypertension Clinically stable exam with well controlled BP. Tolerating medications without side effects at this time. Pt to continue current regimen and low sodium diet; benefits of regular exercise as able discussed. - CBC with Differential/Platelet - Comprehensive metabolic panel - POCT urinalysis dipstick  4. Hypothyroidism due to Hashimoto's thyroiditis Now on one dose daily of compounded medication 95/22.5 - TSH+T4F+T3Free  5. Recurrent major depression in partial remission ([facility]) Clinically stable on current regimen with good control of symptoms, No SI or HI. Will continue current therapy.  6. Hyperlipidemia, mixed On no anti-lipemic medications at this time - Lipid panel  7. BMI 40.0-44.9, adult ([place]) Continue to work on diet changes for weight loss  8. Cervical spondylosis with myelopathy Anticipating surgery in the near future with Dr. Blanche East   Partially dictated using Dragon software. Any errors are unintentional.  Halina Maidens,  MD [facility]  01/22/2021

## 2021-01-23 LAB — COMPREHENSIVE METABOLIC PANEL
ALT: 12 IU/L (ref 0–32)
AST: 13 IU/L (ref 0–40)
Albumin/Globulin Ratio: 1.8 (ref 1.2–2.2)
Albumin: 4.6 g/dL (ref 3.7–4.7)
Alkaline Phosphatase: 74 IU/L (ref 44–121)
BUN/Creatinine Ratio: 16 (ref 12–28)
BUN: 16 mg/dL (ref 8–27)
Bilirubin Total: 0.9 mg/dL (ref 0.0–1.2)
CO2: 25 mmol/L (ref 20–29)
Calcium: 9.6 mg/dL (ref 8.7–10.3)
Chloride: 99 mmol/L (ref 96–106)
Creatinine, Ser: 0.97 mg/dL (ref 0.57–1.00)
Globulin, Total: 2.5 g/dL (ref 1.5–4.5)
Glucose: 96 mg/dL (ref 65–99)
Potassium: 4.9 mmol/L (ref 3.5–5.2)
Sodium: 142 mmol/L (ref 134–144)
Total Protein: 7.1 g/dL (ref 6.0–8.5)
eGFR: 62 mL/min/{1.73_m2} (ref >59)

## 2021-01-23 LAB — LIPID PANEL
Chol/HDL Ratio: 3.8 ratio (ref 0.0–4.4)
Cholesterol, Total: 282 mg/dL — ABNORMAL HIGH (ref 100–199)
HDL: 74 mg/dL (ref >39)
LDL Chol Calc (NIH): 177 mg/dL — ABNORMAL HIGH (ref 0–99)
Triglycerides: 169 mg/dL — ABNORMAL HIGH (ref 0–149)
VLDL Cholesterol Cal: 31 mg/dL (ref 5–40)

## 2021-01-23 LAB — CBC WITH DIFFERENTIAL/PLATELET
Basophils Absolute: 0.1 10*3/uL (ref 0.0–0.2)
Basos: 1 %
EOS (ABSOLUTE): 0.2 10*3/uL (ref 0.0–0.4)
Eos: 3 %
Hematocrit: 43.7 % (ref 34.0–46.6)
Hemoglobin: 14.7 g/dL (ref 11.1–15.9)
Immature Grans (Abs): 0 10*3/uL (ref 0.0–0.1)
Immature Granulocytes: 0 %
Lymphocytes Absolute: 1.9 10*3/uL (ref 0.7–3.1)
Lymphs: 23 %
MCH: 28 pg (ref 26.6–33.0)
MCHC: 33.6 g/dL (ref 31.5–35.7)
MCV: 83 fL (ref 79–97)
Monocytes Absolute: 0.6 10*3/uL (ref 0.1–0.9)
Monocytes: 7 %
Neutrophils Absolute: 5.5 10*3/uL (ref 1.4–7.0)
Neutrophils: 66 %
Platelets: 283 10*3/uL (ref 150–450)
RBC: 5.25 x10E6/uL (ref 3.77–5.28)
RDW: 13.8 % (ref 11.7–15.4)
WBC: 8.2 10*3/uL (ref 3.4–10.8)

## 2021-01-23 LAB — TSH+T4F+T3FREE
Free T4: 1 ng/dL (ref 0.82–1.77)
T3, Free: 2.8 pg/mL (ref 2.0–4.4)
TSH: 0.145 u[IU]/mL — ABNORMAL LOW (ref 0.450–4.500)

## 2021-01-29 DIAGNOSIS — M5020 Other cervical disc displacement, unspecified cervical region: Secondary | ICD-10-CM | POA: Diagnosis not present

## 2021-02-22 DIAGNOSIS — Z20822 Contact with and (suspected) exposure to covid-19: Secondary | ICD-10-CM | POA: Diagnosis not present

## 2021-02-23 DIAGNOSIS — M50221 Other cervical disc displacement at C4-C5 level: Secondary | ICD-10-CM | POA: Diagnosis not present

## 2021-02-23 DIAGNOSIS — M546 Pain in thoracic spine: Secondary | ICD-10-CM | POA: Diagnosis not present

## 2021-02-23 DIAGNOSIS — M5001 Cervical disc disorder with myelopathy,  high cervical region: Secondary | ICD-10-CM | POA: Diagnosis not present

## 2021-02-23 DIAGNOSIS — Z791 Long term (current) use of non-steroidal anti-inflammatories (NSAID): Secondary | ICD-10-CM | POA: Diagnosis not present

## 2021-02-23 DIAGNOSIS — M1990 Unspecified osteoarthritis, unspecified site: Secondary | ICD-10-CM | POA: Diagnosis not present

## 2021-02-23 DIAGNOSIS — M5412 Radiculopathy, cervical region: Secondary | ICD-10-CM | POA: Diagnosis not present

## 2021-02-23 DIAGNOSIS — I10 Essential (primary) hypertension: Secondary | ICD-10-CM | POA: Diagnosis not present

## 2021-02-23 DIAGNOSIS — G992 Myelopathy in diseases classified elsewhere: Secondary | ICD-10-CM | POA: Diagnosis not present

## 2021-02-23 DIAGNOSIS — Z79899 Other long term (current) drug therapy: Secondary | ICD-10-CM | POA: Diagnosis not present

## 2021-02-23 DIAGNOSIS — M5000 Cervical disc disorder with myelopathy, unspecified cervical region: Secondary | ICD-10-CM | POA: Diagnosis not present

## 2021-02-23 DIAGNOSIS — M5021 Other cervical disc displacement,  high cervical region: Secondary | ICD-10-CM | POA: Diagnosis not present

## 2021-02-23 DIAGNOSIS — M50223 Other cervical disc displacement at C6-C7 level: Secondary | ICD-10-CM | POA: Diagnosis not present

## 2021-02-23 DIAGNOSIS — M2578 Osteophyte, vertebrae: Secondary | ICD-10-CM | POA: Diagnosis not present

## 2021-02-23 DIAGNOSIS — M4802 Spinal stenosis, cervical region: Secondary | ICD-10-CM | POA: Diagnosis not present

## 2021-02-23 DIAGNOSIS — M797 Fibromyalgia: Secondary | ICD-10-CM | POA: Diagnosis not present

## 2021-02-23 DIAGNOSIS — F32A Depression, unspecified: Secondary | ICD-10-CM | POA: Diagnosis not present

## 2021-02-23 DIAGNOSIS — M50222 Other cervical disc displacement at C5-C6 level: Secondary | ICD-10-CM | POA: Diagnosis not present

## 2021-02-23 DIAGNOSIS — Z981 Arthrodesis status: Secondary | ICD-10-CM | POA: Diagnosis not present

## 2021-02-23 HISTORY — PX: OTHER SURGICAL HISTORY: SHX169

## 2021-02-24 ENCOUNTER — Ambulatory Visit: Payer: Medicare HMO

## 2021-02-25 DIAGNOSIS — M5412 Radiculopathy, cervical region: Secondary | ICD-10-CM | POA: Diagnosis not present

## 2021-02-25 DIAGNOSIS — M5000 Cervical disc disorder with myelopathy, unspecified cervical region: Secondary | ICD-10-CM | POA: Diagnosis not present

## 2021-02-25 DIAGNOSIS — Z4789 Encounter for other orthopedic aftercare: Secondary | ICD-10-CM | POA: Diagnosis not present

## 2021-02-25 DIAGNOSIS — F32A Depression, unspecified: Secondary | ICD-10-CM | POA: Diagnosis not present

## 2021-02-25 DIAGNOSIS — M797 Fibromyalgia: Secondary | ICD-10-CM | POA: Diagnosis not present

## 2021-02-25 DIAGNOSIS — E063 Autoimmune thyroiditis: Secondary | ICD-10-CM | POA: Diagnosis not present

## 2021-02-25 DIAGNOSIS — I10 Essential (primary) hypertension: Secondary | ICD-10-CM | POA: Diagnosis not present

## 2021-02-25 DIAGNOSIS — M1990 Unspecified osteoarthritis, unspecified site: Secondary | ICD-10-CM | POA: Diagnosis not present

## 2021-02-25 DIAGNOSIS — Z79891 Long term (current) use of opiate analgesic: Secondary | ICD-10-CM | POA: Diagnosis not present

## 2021-02-26 DIAGNOSIS — I10 Essential (primary) hypertension: Secondary | ICD-10-CM | POA: Diagnosis not present

## 2021-02-26 DIAGNOSIS — M797 Fibromyalgia: Secondary | ICD-10-CM | POA: Diagnosis not present

## 2021-02-26 DIAGNOSIS — Z79891 Long term (current) use of opiate analgesic: Secondary | ICD-10-CM | POA: Diagnosis not present

## 2021-02-26 DIAGNOSIS — F32A Depression, unspecified: Secondary | ICD-10-CM | POA: Diagnosis not present

## 2021-02-26 DIAGNOSIS — M1990 Unspecified osteoarthritis, unspecified site: Secondary | ICD-10-CM | POA: Diagnosis not present

## 2021-02-26 DIAGNOSIS — M5000 Cervical disc disorder with myelopathy, unspecified cervical region: Secondary | ICD-10-CM | POA: Diagnosis not present

## 2021-02-26 DIAGNOSIS — Z4789 Encounter for other orthopedic aftercare: Secondary | ICD-10-CM | POA: Diagnosis not present

## 2021-02-26 DIAGNOSIS — M5412 Radiculopathy, cervical region: Secondary | ICD-10-CM | POA: Diagnosis not present

## 2021-02-26 DIAGNOSIS — E063 Autoimmune thyroiditis: Secondary | ICD-10-CM | POA: Diagnosis not present

## 2021-03-02 DIAGNOSIS — M5412 Radiculopathy, cervical region: Secondary | ICD-10-CM | POA: Diagnosis not present

## 2021-03-02 DIAGNOSIS — F32A Depression, unspecified: Secondary | ICD-10-CM | POA: Diagnosis not present

## 2021-03-02 DIAGNOSIS — Z4789 Encounter for other orthopedic aftercare: Secondary | ICD-10-CM | POA: Diagnosis not present

## 2021-03-02 DIAGNOSIS — M5000 Cervical disc disorder with myelopathy, unspecified cervical region: Secondary | ICD-10-CM | POA: Diagnosis not present

## 2021-03-02 DIAGNOSIS — Z79891 Long term (current) use of opiate analgesic: Secondary | ICD-10-CM | POA: Diagnosis not present

## 2021-03-02 DIAGNOSIS — M797 Fibromyalgia: Secondary | ICD-10-CM | POA: Diagnosis not present

## 2021-03-02 DIAGNOSIS — E063 Autoimmune thyroiditis: Secondary | ICD-10-CM | POA: Diagnosis not present

## 2021-03-02 DIAGNOSIS — M1990 Unspecified osteoarthritis, unspecified site: Secondary | ICD-10-CM | POA: Diagnosis not present

## 2021-03-02 DIAGNOSIS — I10 Essential (primary) hypertension: Secondary | ICD-10-CM | POA: Diagnosis not present

## 2021-03-03 ENCOUNTER — Ambulatory Visit: Payer: Medicare HMO

## 2021-03-03 DIAGNOSIS — M5000 Cervical disc disorder with myelopathy, unspecified cervical region: Secondary | ICD-10-CM | POA: Diagnosis not present

## 2021-03-03 DIAGNOSIS — M797 Fibromyalgia: Secondary | ICD-10-CM | POA: Diagnosis not present

## 2021-03-03 DIAGNOSIS — M5412 Radiculopathy, cervical region: Secondary | ICD-10-CM | POA: Diagnosis not present

## 2021-03-03 DIAGNOSIS — I10 Essential (primary) hypertension: Secondary | ICD-10-CM | POA: Diagnosis not present

## 2021-03-03 DIAGNOSIS — Z4789 Encounter for other orthopedic aftercare: Secondary | ICD-10-CM | POA: Diagnosis not present

## 2021-03-03 DIAGNOSIS — M1990 Unspecified osteoarthritis, unspecified site: Secondary | ICD-10-CM | POA: Diagnosis not present

## 2021-03-03 DIAGNOSIS — F32A Depression, unspecified: Secondary | ICD-10-CM | POA: Diagnosis not present

## 2021-03-03 DIAGNOSIS — Z79891 Long term (current) use of opiate analgesic: Secondary | ICD-10-CM | POA: Diagnosis not present

## 2021-03-03 DIAGNOSIS — E063 Autoimmune thyroiditis: Secondary | ICD-10-CM | POA: Diagnosis not present

## 2021-03-05 DIAGNOSIS — Z4789 Encounter for other orthopedic aftercare: Secondary | ICD-10-CM | POA: Diagnosis not present

## 2021-03-05 DIAGNOSIS — Z79891 Long term (current) use of opiate analgesic: Secondary | ICD-10-CM | POA: Diagnosis not present

## 2021-03-05 DIAGNOSIS — F32A Depression, unspecified: Secondary | ICD-10-CM | POA: Diagnosis not present

## 2021-03-05 DIAGNOSIS — M1990 Unspecified osteoarthritis, unspecified site: Secondary | ICD-10-CM | POA: Diagnosis not present

## 2021-03-05 DIAGNOSIS — E063 Autoimmune thyroiditis: Secondary | ICD-10-CM | POA: Diagnosis not present

## 2021-03-05 DIAGNOSIS — M5412 Radiculopathy, cervical region: Secondary | ICD-10-CM | POA: Diagnosis not present

## 2021-03-05 DIAGNOSIS — M5000 Cervical disc disorder with myelopathy, unspecified cervical region: Secondary | ICD-10-CM | POA: Diagnosis not present

## 2021-03-05 DIAGNOSIS — I10 Essential (primary) hypertension: Secondary | ICD-10-CM | POA: Diagnosis not present

## 2021-03-05 DIAGNOSIS — M797 Fibromyalgia: Secondary | ICD-10-CM | POA: Diagnosis not present

## 2021-03-08 DIAGNOSIS — F32A Depression, unspecified: Secondary | ICD-10-CM | POA: Diagnosis not present

## 2021-03-08 DIAGNOSIS — Z4789 Encounter for other orthopedic aftercare: Secondary | ICD-10-CM | POA: Diagnosis not present

## 2021-03-08 DIAGNOSIS — M5000 Cervical disc disorder with myelopathy, unspecified cervical region: Secondary | ICD-10-CM | POA: Diagnosis not present

## 2021-03-08 DIAGNOSIS — M797 Fibromyalgia: Secondary | ICD-10-CM | POA: Diagnosis not present

## 2021-03-08 DIAGNOSIS — Z79891 Long term (current) use of opiate analgesic: Secondary | ICD-10-CM | POA: Diagnosis not present

## 2021-03-08 DIAGNOSIS — I10 Essential (primary) hypertension: Secondary | ICD-10-CM | POA: Diagnosis not present

## 2021-03-08 DIAGNOSIS — E063 Autoimmune thyroiditis: Secondary | ICD-10-CM | POA: Diagnosis not present

## 2021-03-08 DIAGNOSIS — M1990 Unspecified osteoarthritis, unspecified site: Secondary | ICD-10-CM | POA: Diagnosis not present

## 2021-03-08 DIAGNOSIS — M5412 Radiculopathy, cervical region: Secondary | ICD-10-CM | POA: Diagnosis not present

## 2021-03-24 ENCOUNTER — Ambulatory Visit: Payer: Medicare HMO

## 2021-03-30 ENCOUNTER — Other Ambulatory Visit: Payer: Self-pay | Admitting: Internal Medicine

## 2021-04-05 ENCOUNTER — Ambulatory Visit (INDEPENDENT_AMBULATORY_CARE_PROVIDER_SITE_OTHER): Payer: Medicare HMO

## 2021-04-05 DIAGNOSIS — Z0000 Encounter for general adult medical examination without abnormal findings: Secondary | ICD-10-CM | POA: Diagnosis not present

## 2021-04-05 NOTE — Progress Notes (Signed)
Subjective:   Deanna Johnson is a 70 y.o. female who presents for Medicare Annual (Subsequent) preventive examination.  Virtual Visit via Telephone Note  I connected with  SIAN BOURGUIGNON on 04/05/21 at  2:40 PM EDT by telephone and verified that I am speaking with the correct person using two identifiers.  Location: Patient: home Provider: [facility] Persons participating in the virtual visit: [place]   I discussed the limitations, risks, security and privacy concerns of performing an evaluation and management service by telephone and the availability of in person appointments. The patient expressed understanding and agreed to proceed.  Interactive audio and video telecommunications were attempted between this nurse and patient, however failed, due to patient having technical difficulties OR patient did not have access to video capability.  We continued and completed visit with audio only.  Some vital signs may be absent or patient reported.   Clemetine Marker, LPN   Review of Systems     Cardiac Risk Factors include: advanced age (>14mn, >>58women);hypertension;obesity (BMI >30kg/m2);sedentary lifestyle     Objective:    Today's Vitals   04/05/21 1455  PainSc: 3    There is no height or weight on file to calculate BMI.  Advanced Directives 04/05/2021 02/24/2020 02/01/2019 01/21/2019 01/04/2019 07/21/2017 04/10/2017  Does Patient Have a Medical Advance Directive? Yes Yes Yes Yes Yes Yes Yes  Type of AParamedicof ATeton VillageLiving will HSan LorenzoLiving will HWest PeoriaLiving will HColonLiving will HLa VillaLiving will HBluffsLiving will HOnancockLiving will  Does patient want to make changes to medical advance directive? - - No - Patient declined - - - -  Copy of HOur Townin Chart? Yes - validated most recent copy  scanned in chart (See row information) Yes - validated most recent copy scanned in chart (See row information) Yes - validated most recent copy scanned in chart (See row information) No - copy requested No - copy requested No - copy requested No - copy requested  Would patient like information on creating a medical advance directive? - - - - - - -    Current Medications (verified) Outpatient Encounter Medications as of 04/05/2021  Medication Sig   FLUoxetine (PROZAC) 20 MG capsule Take 1 capsule by mouth daily.   fluticasone (FLONASE) 50 MCG/ACT nasal spray SPRAY 2 SPRAYS INTO BOTH NOSTRILS DAILY   hydrochlorothiazide (HYDRODIURIL) 25 MG tablet Take 1 tablet (25 mg total) by mouth daily.   lisinopril (ZESTRIL) 20 MG tablet Take 1 tablet (20 mg total) by mouth daily.   methocarbamol (ROBAXIN) 750 MG tablet Take 1 tablet by mouth 3 (three) times daily as needed.   Multiple Vitamins-Minerals (CENTRUM SILVER PO) Take by mouth.   NONFORMULARY OR COMPOUNDED ITEM 152 mcg T4/ 36 mcg T3   Magnesium Gluconate 550 MG TABS Take by mouth. (Patient not taking: Reported on 04/05/2021)   zinc gluconate 50 MG tablet Take 50 mg by mouth daily. (Patient not taking: Reported on 04/05/2021)   [DISCONTINUED] celecoxib (CELEBREX) 200 MG capsule Take by mouth.   [DISCONTINUED] tiZANidine (ZANAFLEX) 4 MG tablet Take by mouth.   No facility-administered encounter medications on file as of 04/05/2021.    Allergies (verified) Patient has no known allergies.   History: Past Medical History:  Diagnosis Date   Allergy    Arthritis    Complete tear of rotator cuff    RIGHT  Depression    Fibromyalgia    Heart murmur    slight no problems   History of tick-borne disease    Hypertension    Hypothyroidism    Shoulder pain, left    TORN ROTATOR CUFF   Past Surgical History:  Procedure Laterality Date   BREAST BIOPSY Right 1987   neg   BREAST SURGERY     RIGHT BREAST BIOPSY   BUNIONECTOMY     CATARACT  EXTRACTION W/PHACO Left 01/04/2019   Procedure: CATARACT EXTRACTION PHACO AND INTRAOCULAR LENS PLACEMENT ([place])  LEFT;  Surgeon: Eulogio Bear, MD;  Location: [facility];  Service: Ophthalmology;  Laterality: Left;   CATARACT EXTRACTION W/PHACO Right 02/01/2019   Procedure: CATARACT EXTRACTION PHACO AND INTRAOCULAR LENS PLACEMENT ([facility])  RIGHT;  Surgeon: Eulogio Bear, MD;  Location: [facility];  Service: Ophthalmology;  Laterality: Right;   COLONOSCOPY  2011   benign polyps - f/u 2016   COLONOSCOPY WITH PROPOFOL N/A 07/21/2017   Procedure: COLONOSCOPY WITH PROPOFOL;  Surgeon: Lollie Sails, MD;  Location: [facility];  Service: Endoscopy;  Laterality: N/A;   EYE SURGERY  June & July 2020   cataract- both eyes   FRACTURE SURGERY     MAXILLARY FACIAL SURGERY   MANDIBLE FRACTURE SURGERY     jaw resection   shoulder arthroscopy Right    SHOULDER ARTHROSCOPY WITH OPEN ROTATOR CUFF REPAIR Left 01/30/2015   Procedure: SHOULDER ARTHROSCOPY WITH POSS OPEN ROTATOR CUFF REPAIR;  Surgeon: Leanor Kail, MD;  Location: [facility];  Service: Orthopedics;  Laterality: Left;  Release of long head of biceps tendon Subacromial decompression Mini open rotator cuff repair   SPINE SURGERY  February 23, 2021   ACDF surgery C4-C7   TONSILLECTOMY     TOTAL ABDOMINAL HYSTERECTOMY     TUBAL LIGATION  1978   Family History  Problem Relation Age of Onset   Breast cancer Maternal Aunt 77   Diabetes Maternal Aunt    Hypertension Mother    Bursitis Mother    Hearing loss Mother    Early death Father    Social History   Socioeconomic History   Marital status: Divorced    Spouse name: Not on file   Number of children: 1   Years of education: Not on file   Highest education level: Not on file  Occupational History   Occupation: retired  Tobacco Use   Smoking status: Former    Packs/day: 0.25    Years: 3.00    Pack years: 0.75    Types: Cigarettes    Quit  date: 07/18/1970    Years since quitting: 50.7   Smokeless tobacco: Never   Tobacco comments:    quit at age 38  Vaping Use   Vaping Use: Never used  Substance and Sexual Activity   Alcohol use: No   Drug use: No   Sexual activity: Not Currently    Birth control/protection: Post-menopausal  Other Topics Concern   Not on file  Social History Narrative   Pt lives alone.    Social Determinants of Health   Financial Resource Strain: Low Risk    Difficulty of Paying Living Expenses: Not hard at all  Food Insecurity: No Food Insecurity   Worried About Charity fundraiser in the Last Year: Never true   [place] in the Last Year: Never true  Transportation Needs: No Transportation Needs   Lack of Transportation (Medical): No   Lack  of Transportation (Non-Medical): No  Physical Activity: Inactive   Days of Exercise per Week: 0 days   Minutes of Exercise per Session: 0 min  Stress: No Stress Concern Present   Feeling of Stress : Only a little  Social Connections: Socially Isolated   Frequency of Communication with Friends and Family: More than three times a week   Frequency of Social Gatherings with Friends and Family: Three times a week   Attends Religious Services: Never   Active Member of Clubs or Organizations: No   Attends Archivist Meetings: Never   Marital Status: Divorced    Tobacco Counseling Counseling given: Not Answered Tobacco comments: quit at age 22   Clinical Intake:  Pre-visit preparation completed: Yes  Pain : 0-10 Pain Score: 3  Pain Type: Chronic pain Pain Location: Back Pain Orientation: Lower Pain Descriptors / Indicators: Aching, Sore Pain Onset: More than a month ago Pain Frequency: Constant     Nutritional Risks: None Diabetes: No  How often do you need to have someone help you when you read instructions, pamphlets, or other written materials from your doctor or pharmacy?: 1 - Never    Interpreter Needed?:  No  Information entered by :: Clemetine Marker LPN   Activities of Daily Living In your present state of health, do you have any difficulty performing the following activities: 04/05/2021 01/22/2021  Hearing? N N  Vision? N N  Difficulty concentrating or making decisions? N N  Walking or climbing stairs? Y N  Dressing or bathing? N N  Doing errands, shopping? N N  Preparing Food and eating ? N -  Using the Toilet? N -  In the past six months, have you accidently leaked urine? N -  Do you have problems with loss of bowel control? N -  Managing your Medications? N -  Managing your Finances? N -  Housekeeping or managing your Housekeeping? N -  Some recent data might be hidden    Patient Care Team: Glean Hess, MD as PCP - General (Internal Medicine) Sharlet Salina, MD as Referring Physician (Physical Medicine and Rehabilitation) Blanche East, MD as Referring Physician (Neurosurgery) Cira Servant, DO (Rehabilitation)  Indicate any recent Medical Services you may have received from other than [facility] providers in the past year (date may be approximate).     Assessment:   This is a routine wellness examination for Shwetha.  Hearing/Vision screen Hearing Screening - Comments:: Pt denies hearing difficulty Vision Screening - Comments:: Annual vision screenings [facility] at [facility], [facility], [facility]  Dietary issues and exercise activities discussed: Current Exercise Habits: The patient does not participate in regular exercise at present, Exercise limited by: orthopedic condition(s)   Goals Addressed   None    Depression Screen PHQ 2/9 Scores 04/05/2021 01/22/2021 04/29/2020 02/24/2020 10/28/2019 05/24/2019 05/02/2019  PHQ - 2 Score 0 2 0 1 1 0 0  PHQ- 9 Score - 5 0 - 4 0 0    Fall Risk Fall Risk  04/05/2021 01/22/2021 04/29/2020 02/24/2020 10/28/2019  Falls in the past year? 0 0 0 0 0  Number falls in past yr: 0 - - 0 0  Injury with Fall? 0 - - 0 0  Risk for fall due to : Impaired  balance/gait - - Orthopedic patient No Fall Risks;History of fall(s)  Follow up Falls prevention discussed Falls evaluation completed Falls evaluation completed Falls prevention discussed Falls evaluation completed    FALL RISK PREVENTION PERTAINING TO THE HOME:  Any stairs in  or around the home? Yes  If so, are there any without handrails? No  Home free of loose throw rugs in walkways, pet beds, electrical cords, etc? Yes  Adequate lighting in your home to reduce risk of falls? Yes   ASSISTIVE DEVICES UTILIZED TO PREVENT FALLS:  Life alert? No  Use of a cane, walker or w/c? Yes  Grab bars in the bathroom? No  Shower chair or bench in shower? Yes  Elevated toilet seat or a handicapped toilet? Yes   TIMED UP AND GO:  Was the test performed? No . Telephonic visit.   Cognitive Function: Normal cognitive status assessed by direct observation by this Nurse Health Advisor. No abnormalities found.        6CIT Screen 01/21/2019 04/10/2017 04/11/2016  What Year? 0 points 0 points 0 points  What month? 0 points 0 points 0 points  What time? 0 points 0 points 0 points  Count back from 20 0 points 0 points 0 points  Months in reverse 0 points 0 points 0 points  Repeat phrase 0 points 2 points 0 points  Total Score 0 2 0    Immunizations Immunization History  Administered Date(s) Administered   Fluad Quad(high Dose 65+) 04/01/2019   Influenza, High Dose Seasonal PF 04/10/2017   Influenza,inj,Quad PF,6+ Mos 04/11/2016   Influenza,inj,quad, With Preservative 05/18/2018   Influenza-Unspecified 08/01/2011, 04/02/2014, 04/06/2020   Moderna Sars-Covid-2 Vaccination 11/06/2020   PFIZER(Purple Top)SARS-COV-2 Vaccination 08/11/2019, 08/31/2019, 04/17/2020   Pneumococcal Conjugate-13 11/27/2014   Pneumococcal Polysaccharide-23 04/11/2016   Tdap 08/14/2013   Zoster, Live 08/14/2013    TDAP status: Up to date  Flu Vaccine status: Due, Education has been provided regarding the importance of  this vaccine. Advised may receive this vaccine at local pharmacy or Health Dept. Aware to provide a copy of the vaccination record if obtained from local pharmacy or Health Dept. Verbalized acceptance and understanding.  Pneumococcal vaccine status: Up to date  Covid-19 vaccine status: Completed vaccines  Qualifies for Shingles Vaccine? Yes   Zostavax completed Yes   Shingrix Completed?: No.    Education has been provided regarding the importance of this vaccine. Patient has been advised to call insurance company to determine out of pocket expense if they have not yet received this vaccine. Advised may also receive vaccine at local pharmacy or Health Dept. Verbalized acceptance and understanding.  Screening Tests Health Maintenance  Topic Date Due   Zoster Vaccines- Shingrix (1 of 2) Never done   INFLUENZA VACCINE  02/15/2021   COVID-19 Vaccine (5 - Booster for Ayden series) 03/08/2021   MAMMOGRAM  03/17/2021   COLONOSCOPY (Pts 45-41yr Insurance coverage will need to be confirmed)  07/21/2022   TETANUS/TDAP  08/15/2023   DEXA SCAN  Completed   Hepatitis C Screening  Addressed   HPV VACCINES  Aged Out    Health Maintenance  Health Maintenance Due  Topic Date Due   Zoster Vaccines- Shingrix (1 of 2) Never done   INFLUENZA VACCINE  02/15/2021   COVID-19 Vaccine (5 - Booster for PAlapahaseries) 03/08/2021   MAMMOGRAM  03/17/2021    Colorectal cancer screening: Type of screening: Colonoscopy. Completed 07/21/17. Repeat every 5 years  Mammogram status: Completed 03/17/20. Repeat every year  Bone Density status: Completed 03/17/20. Results reflect: Bone density results: NORMAL. Repeat every 2 years.  Lung Cancer Screening: (Low Dose CT Chest recommended if Age 956-80years, 30 pack-year currently smoking OR have quit w/in 15years.) does not qualify.  Additional Screening:  Hepatitis C Screening: does qualify; Completed 04/11/16  Vision Screening: Recommended annual  ophthalmology exams for early detection of glaucoma and other disorders of the eye. Is the patient up to date with their annual eye exam?  Yes  Who is the provider or what is the name of the office in which the patient attends annual eye exams? [facility].   Dental Screening: Recommended annual dental exams for proper oral hygiene  Community Resource Referral / Chronic Care Management: CRR required this visit?  No   CCM required this visit?  No      Plan:     I have personally reviewed and noted the following in the patient's chart:   Medical and social history Use of alcohol, tobacco or illicit drugs  Current medications and supplements including opioid prescriptions.  Functional ability and status Nutritional status Physical activity Advanced directives List of other physicians Hospitalizations, surgeries, and ER visits in previous 12 months Vitals Screenings to include cognitive, depression, and falls Referrals and appointments  In addition, I have reviewed and discussed with patient certain preventive protocols, quality metrics, and best practice recommendations. A written personalized care plan for preventive services as well as general preventive health recommendations were provided to patient.     Clemetine Marker, LPN   [id number]   Nurse Notes: none

## 2021-04-05 NOTE — Patient Instructions (Signed)
Ms. Deanna Johnson , Thank you for taking time to come for your Medicare Wellness Visit. I appreciate your ongoing commitment to your health goals. Please review the following plan we discussed and let me know if I can assist you in the future.   Screening recommendations/referrals: Colonoscopy: done 07/21/17.  Mammogram: done 03/17/20. Please call [phone] to schedule your mammogram.  Bone Density: done 03/17/20 Recommended yearly ophthalmology/optometry visit for glaucoma screening and checkup Recommended yearly dental visit for hygiene and checkup  Vaccinations: Influenza vaccine: due Pneumococcal vaccine: done 04/11/16 Tdap vaccine: done 08/14/13 Shingles vaccine: Shingrix discussed. Please contact your pharmacy for coverage information.  Covid-19:done 08/11/19, 08/31/19 7 04/17/20  Conditions/risks identified: Keep up the great work!  Next appointment: Follow up in one year for your annual wellness visit    Preventive Care 65 Years and Older, Female Preventive care refers to lifestyle choices and visits with your health care provider that can promote health and wellness. What does preventive care include? A yearly physical exam. This is also called an annual well check. Dental exams once or twice a year. Routine eye exams. Ask your health care provider how often you should have your eyes checked. Personal lifestyle choices, including: Daily care of your teeth and gums. Regular physical activity. Eating a healthy diet. Avoiding tobacco and drug use. Limiting alcohol use. Practicing safe sex. Taking low-dose aspirin every day. Taking vitamin and mineral supplements as recommended by your health care provider. What happens during an annual well check? The services and screenings done by your health care provider during your annual well check will depend on your age, overall health, lifestyle risk factors, and family history of disease. Counseling  Your health care provider may ask you  questions about your: Alcohol use. Tobacco use. Drug use. Emotional well-being. Home and relationship well-being. Sexual activity. Eating habits. History of falls. Memory and ability to understand (cognition). Work and work Statistician. Reproductive health. Screening  You may have the following tests or measurements: Height, weight, and BMI. Blood pressure. Lipid and cholesterol levels. These may be checked every 5 years, or more frequently if you are over 35 years old. Skin check. Lung cancer screening. You may have this screening every year starting at age 48 if you have a 30-pack-year history of smoking and currently smoke or have quit within the past 15 years. Fecal occult blood test (FOBT) of the stool. You may have this test every year starting at age 55. Flexible sigmoidoscopy or colonoscopy. You may have a sigmoidoscopy every 5 years or a colonoscopy every 10 years starting at age 62. Hepatitis C blood test. Hepatitis B blood test. Sexually transmitted disease (STD) testing. Diabetes screening. This is done by checking your blood sugar (glucose) after you have not eaten for a while (fasting). You may have this done every 1-3 years. Bone density scan. This is done to screen for osteoporosis. You may have this done starting at age 28. Mammogram. This may be done every 1-2 years. Talk to your health care provider about how often you should have regular mammograms. Talk with your health care provider about your test results, treatment options, and if necessary, the need for more tests. Vaccines  Your health care provider may recommend certain vaccines, such as: Influenza vaccine. This is recommended every year. Tetanus, diphtheria, and acellular pertussis (Tdap, Td) vaccine. You may need a Td booster every 10 years. Zoster vaccine. You may need this after age 41. Pneumococcal 13-valent conjugate (PCV13) vaccine. One dose is recommended after  age 60. Pneumococcal polysaccharide  (PPSV23) vaccine. One dose is recommended after age 66. Talk to your health care provider about which screenings and vaccines you need and how often you need them. This information is not intended to replace advice given to you by your health care provider. Make sure you discuss any questions you have with your health care provider. Document Released: 07/31/2015 Document Revised: 03/23/2016 Document Reviewed: 05/05/2015 Elsevier Interactive Patient Education  2017 [place] Prevention in the Home Falls can cause injuries. They can happen to people of all ages. There are many things you can do to make your home safe and to help prevent falls. What can I do on the outside of my home? Regularly fix the edges of walkways and driveways and fix any cracks. Remove anything that might make you trip as you walk through a door, such as a raised step or threshold. Trim any bushes or trees on the path to your home. Use bright outdoor lighting. Clear any walking paths of anything that might make someone trip, such as rocks or tools. Regularly check to see if handrails are loose or broken. Make sure that both sides of any steps have handrails. Any raised decks and porches should have guardrails on the edges. Have any leaves, snow, or ice cleared regularly. Use sand or salt on walking paths during winter. Clean up any spills in your garage right away. This includes oil or grease spills. What can I do in the bathroom? Use night lights. Install grab bars by the toilet and in the tub and shower. Do not use towel bars as grab bars. Use non-skid mats or decals in the tub or shower. If you need to sit down in the shower, use a plastic, non-slip stool. Keep the floor dry. Clean up any water that spills on the floor as soon as it happens. Remove soap buildup in the tub or shower regularly. Attach bath mats securely with double-sided non-slip rug tape. Do not have throw rugs and other things on the  floor that can make you trip. What can I do in the bedroom? Use night lights. Make sure that you have a light by your bed that is easy to reach. Do not use any sheets or blankets that are too big for your bed. They should not hang down onto the floor. Have a firm chair that has side arms. You can use this for support while you get dressed. Do not have throw rugs and other things on the floor that can make you trip. What can I do in the kitchen? Clean up any spills right away. Avoid walking on wet floors. Keep items that you use a lot in easy-to-reach places. If you need to reach something above you, use a strong step stool that has a grab bar. Keep electrical cords out of the way. Do not use floor polish or wax that makes floors slippery. If you must use wax, use non-skid floor wax. Do not have throw rugs and other things on the floor that can make you trip. What can I do with my stairs? Do not leave any items on the stairs. Make sure that there are handrails on both sides of the stairs and use them. Fix handrails that are broken or loose. Make sure that handrails are as long as the stairways. Check any carpeting to make sure that it is firmly attached to the stairs. Fix any carpet that is loose or worn. Avoid having throw rugs  at the top or bottom of the stairs. If you do have throw rugs, attach them to the floor with carpet tape. Make sure that you have a light switch at the top of the stairs and the bottom of the stairs. If you do not have them, ask someone to add them for you. What else can I do to help prevent falls? Wear shoes that: Do not have high heels. Have rubber bottoms. Are comfortable and fit you well. Are closed at the toe. Do not wear sandals. If you use a stepladder: Make sure that it is fully opened. Do not climb a closed stepladder. Make sure that both sides of the stepladder are locked into place. Ask someone to hold it for you, if possible. Clearly mark and make  sure that you can see: Any grab bars or handrails. First and last steps. Where the edge of each step is. Use tools that help you move around (mobility aids) if they are needed. These include: Canes. Walkers. Scooters. Crutches. Turn on the lights when you go into a dark area. Replace any light bulbs as soon as they burn out. Set up your furniture so you have a clear path. Avoid moving your furniture around. If any of your floors are uneven, fix them. If there are any pets around you, be aware of where they are. Review your medicines with your doctor. Some medicines can make you feel dizzy. This can increase your chance of falling. Ask your doctor what other things that you can do to help prevent falls. This information is not intended to replace advice given to you by your health care provider. Make sure you discuss any questions you have with your health care provider. Document Released: 04/30/2009 Document Revised: 12/10/2015 Document Reviewed: 08/08/2014 Elsevier Interactive Patient Education  2017 [employer].

## 2021-04-28 DIAGNOSIS — M47812 Spondylosis without myelopathy or radiculopathy, cervical region: Secondary | ICD-10-CM | POA: Diagnosis not present

## 2021-04-28 DIAGNOSIS — M5020 Other cervical disc displacement, unspecified cervical region: Secondary | ICD-10-CM | POA: Diagnosis not present

## 2021-04-30 ENCOUNTER — Other Ambulatory Visit: Payer: Self-pay | Admitting: Internal Medicine

## 2021-04-30 DIAGNOSIS — I10 Essential (primary) hypertension: Secondary | ICD-10-CM

## 2021-05-01 NOTE — Telephone Encounter (Signed)
Requested Prescriptions  Pending Prescriptions Disp Refills  . lisinopril (ZESTRIL) 20 MG tablet [Pharmacy Med Name: LISINOPRIL 20 MG TAB] 90 tablet 3    Sig: TAKE (1) TABLET BY MOUTH EVERY DAY     Cardiovascular:  ACE Inhibitors Passed - 04/30/2021  2:35 PM      Passed - Cr in normal range and within 180 days    Creatinine, Ser  Date Value Ref Range Status  01/22/2021 0.97 0.57 - 1.00 mg/dL Final         Passed - K in normal range and within 180 days    Potassium  Date Value Ref Range Status  01/22/2021 4.9 3.5 - 5.2 mmol/L Final         Passed - Patient is not pregnant      Passed - Last BP in normal range    BP Readings from Last 1 Encounters:  01/22/21 138/78         Passed - Valid encounter within last 6 months    Recent Outpatient Visits          3 months ago Annual physical exam   [facility] Glean Hess, MD   1 year ago Essential hypertension   [place], Laura H, MD   1 year ago Annual physical exam   [facility] Glean Hess, MD   1 year ago Acute recurrent maxillary sinusitis   [facility] Glean Hess, MD   2 years ago Essential hypertension   [facility] Glean Hess, MD      Future Appointments            In 8 months Army Melia Jesse Sans, MD [facility]           . hydrochlorothiazide (HYDRODIURIL) 25 MG tablet [Pharmacy Med Name: HYDROCHLOROTHIAZIDE 25 MG TAB] 90 tablet 3    Sig: TAKE (1) TABLET BY MOUTH EVERY DAY     Cardiovascular: Diuretics - Thiazide Passed - 04/30/2021  2:35 PM      Passed - Ca in normal range and within 360 days    Calcium  Date Value Ref Range Status  01/22/2021 9.6 8.7 - 10.3 mg/dL Final         Passed - Cr in normal range and within 360 days    Creatinine, Ser  Date Value Ref Range Status  01/22/2021 0.97 0.57 - 1.00 mg/dL Final         Passed - K in normal range and within 360 days    Potassium  Date Value Ref Range  Status  01/22/2021 4.9 3.5 - 5.2 mmol/L Final         Passed - Na in normal range and within 360 days    Sodium  Date Value Ref Range Status  01/22/2021 142 134 - 144 mmol/L Final         Passed - Last BP in normal range    BP Readings from Last 1 Encounters:  01/22/21 138/78         Passed - Valid encounter within last 6 months    Recent Outpatient Visits          3 months ago Annual physical exam   [facility] Glean Hess, MD   1 year ago Essential hypertension   [facility] Glean Hess, MD   1 year ago Annual physical exam   [facility] Glean Hess, MD  1 year ago Acute recurrent maxillary sinusitis   [facility] Glean Hess, MD   2 years ago Essential hypertension   [facility] Glean Hess, MD      Future Appointments            In 8 months Army Melia Jesse Sans, MD [facility] [place]           . FLUoxetine (PROZAC) 20 MG capsule [Pharmacy Med Name: FLUOXETINE HCL 20 MG CAP] 30 capsule     Sig: TAKE (1) CAPSULE BY MOUTH EVERY DAY     Psychiatry:  Antidepressants - SSRI Passed - 04/30/2021  2:35 PM      Passed - Completed PHQ-2 or PHQ-9 in the last 360 days      Passed - Valid encounter within last 6 months    Recent Outpatient Visits          3 months ago Annual physical exam   [facility] Glean Hess, MD   1 year ago Essential hypertension   [place], Laura H, MD   1 year ago Annual physical exam   [facility] Glean Hess, MD   1 year ago Acute recurrent maxillary sinusitis   [facility] Glean Hess, MD   2 years ago Essential hypertension   [place], Laura H, MD      Future Appointments            In 8 months Army Melia Jesse Sans, MD [facility], [facility]

## 2021-05-01 NOTE — Telephone Encounter (Signed)
Requested medications are due for refill today yes  Requested medications are on the active medication list yes  Last refill 9/13  Last visit 01/22/21  Future visit scheduled 01/25/22  Notes to clinic Historical Provider, please assess.  Requested Prescriptions  Pending Prescriptions Disp Refills   FLUoxetine (PROZAC) 20 MG capsule [Pharmacy Med Name: FLUOXETINE HCL 20 MG CAP] 30 capsule     Sig: TAKE (1) CAPSULE BY MOUTH EVERY DAY     Psychiatry:  Antidepressants - SSRI Passed - 04/30/2021  2:35 PM      Passed - Completed PHQ-2 or PHQ-9 in the last 360 days      Passed - Valid encounter within last 6 months    Recent Outpatient Visits           3 months ago Annual physical exam   [facility] Glean Hess, MD   1 year ago Essential hypertension   [place], Laura H, MD   1 year ago Annual physical exam   [facility] Glean Hess, MD   1 year ago Acute recurrent maxillary sinusitis   [facility], [facility]   2 years ago Essential hypertension   [facility] Glean Hess, MD       Future Appointments             In 8 months Army Melia Jesse Sans, MD [facility]            Signed Prescriptions Disp Refills   lisinopril (ZESTRIL) 20 MG tablet 90 tablet 3    Sig: TAKE (1) TABLET BY MOUTH EVERY DAY     Cardiovascular:  ACE Inhibitors Passed - 04/30/2021  2:35 PM      Passed - Cr in normal range and within 180 days    Creatinine, Ser  Date Value Ref Range Status  01/22/2021 0.97 0.57 - 1.00 mg/dL Final          Passed - K in normal range and within 180 days    Potassium  Date Value Ref Range Status  01/22/2021 4.9 3.5 - 5.2 mmol/L Final          Passed - Patient is not pregnant      Passed - Last BP in normal range    BP Readings from Last 1 Encounters:  01/22/21 138/78          Passed - Valid encounter within last 6 months    Recent Outpatient Visits            3 months ago Annual physical exam   [facility] Glean Hess, MD   1 year ago Essential hypertension   [place], Laura H, MD   1 year ago Annual physical exam   [facility] Glean Hess, MD   1 year ago Acute recurrent maxillary sinusitis   [facility] Glean Hess, MD   2 years ago Essential hypertension   [place], Laura H, MD       Future Appointments             In 8 months Glean Hess, MD [facility]             hydrochlorothiazide (HYDRODIURIL) 25 MG tablet 90 tablet 3    Sig: TAKE (1) TABLET BY MOUTH EVERY DAY     Cardiovascular: Diuretics - Thiazide Passed - 04/30/2021  2:35  PM      Passed - Ca in normal range and within 360 days    Calcium  Date Value Ref Range Status  01/22/2021 9.6 8.7 - 10.3 mg/dL Final          Passed - Cr in normal range and within 360 days    Creatinine, Ser  Date Value Ref Range Status  01/22/2021 0.97 0.57 - 1.00 mg/dL Final          Passed - K in normal range and within 360 days    Potassium  Date Value Ref Range Status  01/22/2021 4.9 3.5 - 5.2 mmol/L Final          Passed - Na in normal range and within 360 days    Sodium  Date Value Ref Range Status  01/22/2021 142 134 - 144 mmol/L Final          Passed - Last BP in normal range    BP Readings from Last 1 Encounters:  01/22/21 138/78          Passed - Valid encounter within last 6 months    Recent Outpatient Visits           3 months ago Annual physical exam   [facility] Glean Hess, MD   1 year ago Essential hypertension   [place], Laura H, MD   1 year ago Annual physical exam   [facility] Glean Hess, MD   1 year ago Acute recurrent maxillary sinusitis   [facility] Glean Hess, MD   2 years ago Essential hypertension   [facility] Glean Hess,  MD       Future Appointments             In 8 months Army Melia Jesse Sans, MD [facility], [facility]

## 2021-07-07 ENCOUNTER — Encounter: Payer: Self-pay | Admitting: Internal Medicine

## 2021-07-07 ENCOUNTER — Other Ambulatory Visit: Payer: Self-pay | Admitting: Internal Medicine

## 2021-07-07 DIAGNOSIS — E063 Autoimmune thyroiditis: Secondary | ICD-10-CM

## 2021-07-14 ENCOUNTER — Other Ambulatory Visit: Payer: Self-pay

## 2021-07-14 ENCOUNTER — Ambulatory Visit
Admission: RE | Admit: 2021-07-14 | Discharge: 2021-07-14 | Disposition: A | Discharge status: Home / Self Care | Payer: Medicare HMO | Source: Ambulatory Visit | Attending: Internal Medicine | Admitting: Internal Medicine

## 2021-07-14 DIAGNOSIS — Z1231 Encounter for screening mammogram for malignant neoplasm of breast: Secondary | ICD-10-CM | POA: Insufficient documentation

## 2021-07-16 ENCOUNTER — Other Ambulatory Visit: Payer: Self-pay

## 2021-07-16 DIAGNOSIS — E038 Other specified hypothyroidism: Secondary | ICD-10-CM | POA: Diagnosis not present

## 2021-07-16 DIAGNOSIS — E063 Autoimmune thyroiditis: Secondary | ICD-10-CM | POA: Diagnosis not present

## 2021-07-17 LAB — TSH+T4F+T3FREE
Free T4: 0.98 ng/dL (ref 0.82–1.77)
T3, Free: 2.8 pg/mL (ref 2.0–4.4)
TSH: 0.861 u[IU]/mL (ref 0.450–4.500)

## 2021-07-20 ENCOUNTER — Telehealth: Payer: Self-pay | Admitting: Internal Medicine

## 2021-07-20 ENCOUNTER — Other Ambulatory Visit: Payer: Self-pay | Admitting: Internal Medicine

## 2021-07-20 NOTE — Telephone Encounter (Signed)
Pt is calling to receive her lab results please advise [phone]- [phone]

## 2021-07-20 NOTE — Telephone Encounter (Signed)
Pt is calling to request a new script to be sent [facility].  Pt is requesting T4 95 mcg.  T3 23 mcg. 1 time a day  T3/T4 sustained release capsule compounded by [facility]  Patient reports that will set up an appt with Dr. Army Melia in a few month regarding her cholesterol.   Please advise [phone]

## 2021-07-20 NOTE — Telephone Encounter (Signed)
Please review.  KP

## 2021-07-20 NOTE — Telephone Encounter (Signed)
See result notes, patient viewed in [place].

## 2021-07-21 ENCOUNTER — Other Ambulatory Visit: Payer: Self-pay | Admitting: Internal Medicine

## 2021-07-21 NOTE — Telephone Encounter (Signed)
Spoke to [facility] asked for script to be faxed over. Lauren verbalized understanding.   [phone]   KP

## 2021-07-30 DIAGNOSIS — H524 Presbyopia: Secondary | ICD-10-CM | POA: Diagnosis not present

## 2021-07-30 DIAGNOSIS — H40003 Preglaucoma, unspecified, bilateral: Secondary | ICD-10-CM | POA: Diagnosis not present

## 2021-09-10 ENCOUNTER — Other Ambulatory Visit: Payer: Self-pay | Admitting: Internal Medicine

## 2021-09-10 NOTE — Telephone Encounter (Signed)
Requested medications are due for refill today.  yes  Requested medications are on the active medications list.  yes  Last refill. 03/30/2021 16g/ 2 refills  Future visit scheduled.   yes  Notes to clinic.  Medication not delegated.    Requested Prescriptions  Pending Prescriptions Disp Refills   fluticasone (FLONASE) 50 MCG/ACT nasal spray [Pharmacy Med Name: FLUTICASONE PROPIONATE 50 MCG/ACT N] 16 g 2    Sig: SPRAY 2 SPRAYS INTO BOTH NOSTRILS DAILY     Not Delegated - Ear, Nose, and Throat: Nasal Preparations - Corticosteroids Failed - 09/10/2021  9:28 AM      Failed - This refill cannot be delegated      Passed - Valid encounter within last 12 months    Recent Outpatient Visits           7 months ago Annual physical exam   [facility] Glean Hess, MD   1 year ago Essential hypertension   [place], Laura H, MD   1 year ago Annual physical exam   [facility] Glean Hess, MD   2 years ago Acute recurrent maxillary sinusitis   [facility] Glean Hess, MD   2 years ago Essential hypertension   [place], Laura H, MD       Future Appointments             In 5 months Army Melia Jesse Sans, MD [facility], [facility]

## 2021-11-12 ENCOUNTER — Other Ambulatory Visit: Payer: Self-pay | Admitting: Internal Medicine

## 2021-11-12 NOTE — Telephone Encounter (Signed)
Requested medication (s) are due for refill today: yes ? ?Requested medication (s) are on the active medication list: yes ? ?Last refill:  05/04/21 ? ?Future visit scheduled: yes in 2 months ? ?Notes to clinic:  needs sooner appointment for labs ? ? ?Requested Prescriptions  ?Pending Prescriptions Disp Refills  ? FLUoxetine (PROZAC) 20 MG capsule [Pharmacy Med Name: FLUOXETINE HCL 20 MG CAP] 90 capsule 1  ?  Sig: TAKE (1) CAPSULE BY MOUTH EVERY DAY  ?  ? Psychiatry:  Antidepressants - SSRI Failed - 11/12/2021  2:09 PM  ?  ?  Failed - Valid encounter within last 6 months  ?  Recent Outpatient Visits   ? ?      ? 9 months ago Annual physical exam  ? [facility] Glean Hess, MD  ? 1 year ago Essential hypertension  ? [facility] Glean Hess, MD  ? 2 years ago Annual physical exam  ? [facility] Glean Hess, MD  ? 2 years ago Acute recurrent maxillary sinusitis  ? [facility] Glean Hess, MD  ? 2 years ago Essential hypertension  ? [facility] Glean Hess, MD  ? ?  ?  ?Future Appointments   ? ?        ? In 2 months Army Melia Jesse Sans, MD [facility], [place]  ? ?  ? ? ?  ?  ?  Passed - Completed PHQ-2 or PHQ-9 in the last 360 days  ?  ?  ? ? ? ? ?

## 2022-01-10 ENCOUNTER — Other Ambulatory Visit: Payer: Self-pay | Admitting: Internal Medicine

## 2022-01-11 ENCOUNTER — Encounter: Payer: Self-pay | Admitting: Internal Medicine

## 2022-01-11 ENCOUNTER — Other Ambulatory Visit: Payer: Self-pay | Admitting: Internal Medicine

## 2022-01-11 ENCOUNTER — Telehealth: Payer: Self-pay | Admitting: Internal Medicine

## 2022-01-11 DIAGNOSIS — E038 Other specified hypothyroidism: Secondary | ICD-10-CM

## 2022-01-11 NOTE — Telephone Encounter (Signed)
We do not do labs before appts. We can see patient then order labs.

## 2022-01-13 DIAGNOSIS — E038 Other specified hypothyroidism: Secondary | ICD-10-CM | POA: Diagnosis not present

## 2022-01-13 DIAGNOSIS — E063 Autoimmune thyroiditis: Secondary | ICD-10-CM | POA: Diagnosis not present

## 2022-01-14 LAB — TSH+T4F+T3FREE
Free T4: 0.77 ng/dL — ABNORMAL LOW (ref 0.82–1.77)
T3, Free: 2.3 pg/mL (ref 2.0–4.4)
TSH: 13.1 u[IU]/mL — ABNORMAL HIGH (ref 0.450–4.500)

## 2022-01-19 ENCOUNTER — Ambulatory Visit (INDEPENDENT_AMBULATORY_CARE_PROVIDER_SITE_OTHER): Payer: Medicare HMO | Admitting: Internal Medicine

## 2022-01-19 ENCOUNTER — Telehealth: Payer: Self-pay | Admitting: Internal Medicine

## 2022-01-19 ENCOUNTER — Encounter: Payer: Self-pay | Admitting: Internal Medicine

## 2022-01-19 VITALS — BP 132/82 | HR 56 | Ht 61.0 in | Wt 223.0 lb

## 2022-01-19 DIAGNOSIS — J3089 Other allergic rhinitis: Secondary | ICD-10-CM

## 2022-01-19 DIAGNOSIS — E038 Other specified hypothyroidism: Secondary | ICD-10-CM

## 2022-01-19 DIAGNOSIS — E063 Autoimmune thyroiditis: Secondary | ICD-10-CM | POA: Diagnosis not present

## 2022-01-19 DIAGNOSIS — M1990 Unspecified osteoarthritis, unspecified site: Secondary | ICD-10-CM | POA: Diagnosis not present

## 2022-01-19 DIAGNOSIS — I10 Essential (primary) hypertension: Secondary | ICD-10-CM | POA: Diagnosis not present

## 2022-01-19 DIAGNOSIS — R21 Rash and other nonspecific skin eruption: Secondary | ICD-10-CM | POA: Diagnosis not present

## 2022-01-19 MED ORDER — LEVOTHYROXINE SODIUM 100 MCG PO TABS: 100.0000 ug | ORAL_TABLET | Freq: Every day | ORAL | 1 refills | Status: DC

## 2022-01-19 MED ORDER — FLUTICASONE PROPIONATE 50 MCG/ACT NA SUSP: NASAL | 2 refills | Status: DC

## 2022-01-19 MED ORDER — CLOTRIMAZOLE-BETAMETHASONE 1-0.05 % EX CREA: 1.0000 | TOPICAL_CREAM | Freq: Every day | CUTANEOUS | 0 refills | Status: DC

## 2022-01-19 MED ORDER — DICLOFENAC SODIUM 1 % EX GEL: 4.0000 g | Freq: Four times a day (QID) | CUTANEOUS | 0 refills | Status: DC

## 2022-01-19 NOTE — Telephone Encounter (Signed)
Deanna Johnson with [facility] is calling in for assistance. They received a Rx for clotrimazole-betamethasone (LOTRISONE) cream. They are out of the cream and would like to know if they could prescribe separately?    Please advise further.

## 2022-01-19 NOTE — Telephone Encounter (Signed)
Called and informed okay to prescribe separately.

## 2022-01-19 NOTE — Progress Notes (Signed)
Date:  01/19/2022   Name:  Deanna Johnson   DOB:  02/23/1951   MRN:  [id number]   Chief Complaint: Hypothyroidism (Was taking 2 caps daily of compounded thyroid medication, and then she stopped it for 2 weeks. Now taking 1/2 caps daily for the last 3 weeks. Wants thyroid levels checked. ) and Abrasion (Right collarbone. Been there for months, and not healing. Itching but not painful.)  Thyroid Problem Presents for follow-up visit. Symptoms include anxiety, constipation, depressed mood, fatigue and nail problem. Patient reports no diarrhea. The symptoms have been worsening.  Rash This is a new problem. The current episode started more than 1 month ago. The problem is unchanged. Associated symptoms include fatigue. Pertinent negatives include no cough, diarrhea, fever or shortness of breath.  Hypertension This is a chronic problem. The problem is controlled. Pertinent negatives include no chest pain or shortness of breath. Past treatments include ACE inhibitors and diuretics. The current treatment provides significant improvement. Identifiable causes of hypertension include a thyroid problem.  Knee Pain  There was no injury mechanism. The pain is present in the left knee. The pain is moderate. The pain has been Fluctuating since onset.    Lab Results  Component Value Date   NA 142 01/22/2021   K 4.9 01/22/2021   CO2 25 01/22/2021   GLUCOSE 96 01/22/2021   BUN 16 01/22/2021   CREATININE 0.97 01/22/2021   CALCIUM 9.6 01/22/2021   EGFR 62 01/22/2021   GFRNONAA 68 10/28/2019   Lab Results  Component Value Date   CHOL 282 (H) 01/22/2021   HDL 74 01/22/2021   LDLCALC 177 (H) 01/22/2021   TRIG 169 (H) 01/22/2021   CHOLHDL 3.8 01/22/2021   Lab Results  Component Value Date   TSH 13.100 (H) 01/13/2022   No results found for: "HGBA1C" Lab Results  Component Value Date   WBC 8.2 01/22/2021   HGB 14.7 01/22/2021   HCT 43.7 01/22/2021   MCV 83 01/22/2021   PLT 283 01/22/2021    Lab Results  Component Value Date   ALT 12 01/22/2021   AST 13 01/22/2021   ALKPHOS 74 01/22/2021   BILITOT 0.9 01/22/2021   Lab Results  Component Value Date   VD25OH 40.5 04/24/2018     Review of Systems  Constitutional:  Positive for fatigue. Negative for chills, fever and unexpected weight change.  HENT:  Negative for trouble swallowing.   Respiratory:  Negative for cough, chest tightness and shortness of breath.   Cardiovascular:  Negative for chest pain and leg swelling.  Gastrointestinal:  Positive for constipation. Negative for abdominal pain and diarrhea.  Musculoskeletal:  Positive for arthralgias (esp left knee) and myalgias.  Skin:  Positive for rash.  Psychiatric/Behavioral:  Positive for dysphoric mood and sleep disturbance. The patient is nervous/anxious.     Patient Active Problem List   Diagnosis Date Noted   Lumbar radiculitis 04/29/2020   Cervical spondylosis with myelopathy 10/25/2018   BMI 40.0-44.9, adult ([place]) 06/27/2018   Hyperlipidemia, mixed 05/05/2017   Hypothyroidism due to Hashimoto's thyroiditis 05/05/2017   Recurrent major depression in partial remission ([place]) 04/11/2016   Hx of colonic polyp 04/11/2016   Arthritis 12/31/2014   Essential hypertension 12/31/2014   Impingement syndrome of left shoulder 12/16/2014   H/O fibromyalgia 08/18/2012    No Known Allergies  Past Surgical History:  Procedure Laterality Date   BREAST BIOPSY Right 1987   neg   BREAST SURGERY     RIGHT  BREAST BIOPSY   BUNIONECTOMY     CATARACT EXTRACTION W/PHACO Left 01/04/2019   Procedure: CATARACT EXTRACTION PHACO AND INTRAOCULAR LENS PLACEMENT ([place])  LEFT;  Surgeon: Eulogio Bear, MD;  Location: [place];  Service: Ophthalmology;  Laterality: Left;   CATARACT EXTRACTION W/PHACO Right 02/01/2019   Procedure: CATARACT EXTRACTION PHACO AND INTRAOCULAR LENS PLACEMENT ([facility])  RIGHT;  Surgeon: Eulogio Bear, MD;  Location: [place];   Service: Ophthalmology;  Laterality: Right;   COLONOSCOPY  2011   benign polyps - f/u 2016   COLONOSCOPY WITH PROPOFOL N/A 07/21/2017   Procedure: COLONOSCOPY WITH PROPOFOL;  Surgeon: Lollie Sails, MD;  Location: [facility];  Service: Endoscopy;  Laterality: N/A;   EYE SURGERY  June & July 2020   cataract- both eyes   FRACTURE SURGERY     MAXILLARY FACIAL SURGERY   MANDIBLE FRACTURE SURGERY     jaw resection   shoulder arthroscopy Right    SHOULDER ARTHROSCOPY WITH OPEN ROTATOR CUFF REPAIR Left 01/30/2015   Procedure: SHOULDER ARTHROSCOPY WITH POSS OPEN ROTATOR CUFF REPAIR;  Surgeon: Leanor Kail, MD;  Location: [place];  Service: Orthopedics;  Laterality: Left;  Release of long head of biceps tendon Subacromial decompression Mini open rotator cuff repair   SPINE SURGERY  February 23, 2021   ACDF surgery C4-C7   TONSILLECTOMY     TOTAL ABDOMINAL HYSTERECTOMY     TUBAL LIGATION  1978    Social History   Tobacco Use   Smoking status: Former    Packs/day: 0.25    Years: 3.00    Total pack years: 0.75    Types: Cigarettes    Quit date: 07/18/1970    Years since quitting: 51.5   Smokeless tobacco: Never   Tobacco comments:    quit at age 68  Vaping Use   Vaping Use: Never used  Substance Use Topics   Alcohol use: No   Drug use: No     Medication list has been reviewed and updated.  Current Meds  Medication Sig   clotrimazole-betamethasone (LOTRISONE) cream Apply 1 Application topically daily. To rash on neck   diclofenac Sodium (VOLTAREN) 1 % GEL Apply 4 g topically 4 (four) times daily.   FLUoxetine (PROZAC) 20 MG capsule TAKE (1) CAPSULE BY MOUTH EVERY DAY   hydrochlorothiazide (HYDRODIURIL) 25 MG tablet TAKE (1) TABLET BY MOUTH EVERY DAY   levothyroxine (SYNTHROID) 100 MCG tablet Take 1 tablet (100 mcg total) by mouth daily.   lisinopril (ZESTRIL) 20 MG tablet TAKE (1) TABLET BY MOUTH EVERY DAY   Magnesium Gluconate 550 MG TABS Take by mouth.    Multiple Vitamins-Minerals (CENTRUM SILVER PO) Take by mouth.   [DISCONTINUED] fluticasone (FLONASE) 50 MCG/ACT nasal spray SPRAY 2 SPRAYS INTO BOTH NOSTRILS DAILY   [DISCONTINUED] NONFORMULARY OR COMPOUNDED ITEM 152 mcg T4/ 36 mcg T3       01/19/2022   11:16 AM 01/22/2021   10:52 AM 04/29/2020    1:31 PM 10/28/2019   10:35 AM  GAD 7 : Generalized Anxiety Score  Nervous, Anxious, on Edge 1 0 0 0  Control/stop worrying 0 0 0 0  Worry too much - different things 0 0 0 0  Trouble relaxing 0 0 0 0  Restless 0 0 0 0  Easily annoyed or irritable 1 0 0 0  Afraid - awful might happen 0 0 0 0  Total GAD 7 Score 2 0 0 0  Anxiety Difficulty Not difficult at  all  Not difficult at all Not difficult at all       01/19/2022   11:16 AM 04/05/2021    3:08 PM 01/22/2021   10:52 AM  Depression screen PHQ 2/9  Decreased Interest 2 0 1  Down, Depressed, Hopeless 1 0 1  PHQ - 2 Score 3 0 2  Altered sleeping 2  0  Tired, decreased energy 3  3  Change in appetite 0  0  Feeling bad or failure about yourself  0  0  Trouble concentrating 1  0  Moving slowly or fidgety/restless 0  0  Suicidal thoughts 0  0  PHQ-9 Score 9  5  Difficult doing work/chores Very difficult  Somewhat difficult    BP Readings from Last 3 Encounters:  01/19/22 132/82  01/22/21 138/78  04/29/20 132/64    Physical Exam Vitals and nursing note reviewed.  Constitutional:      General: She is not in acute distress.    Appearance: Normal appearance. She is well-developed.  HENT:     Head: Normocephalic and atraumatic.  Neck:     Vascular: No carotid bruit.  Cardiovascular:     Rate and Rhythm: Normal rate and regular rhythm.  Pulmonary:     Effort: Pulmonary effort is normal. No respiratory distress.     Breath sounds: No wheezing or rhonchi.  Musculoskeletal:     Cervical back: Normal range of motion.     Left knee: Swelling and bony tenderness present. No effusion or ecchymosis.     Right lower leg: No edema.      Left lower leg: No edema.       Legs:  Lymphadenopathy:     Cervical: No cervical adenopathy.  Skin:    General: Skin is warm and dry.     Findings: No rash.          Comments: 1 cm flat circular red patch  Neurological:     General: No focal deficit present.     Mental Status: She is alert and oriented to person, place, and time.  Psychiatric:        Mood and Affect: Mood normal.        Behavior: Behavior normal.     Wt Readings from Last 3 Encounters:  01/19/22 223 lb (101.2 kg)  01/22/21 221 lb (100.2 kg)  04/29/20 223 lb (101.2 kg)    BP 132/82   Pulse (!) 56   Ht 5' 1"  (1.549 m)   Wt 223 lb (101.2 kg)   BMI 42.14 kg/m   Assessment and Plan: 1. Hypothyroidism due to Hashimoto's thyroiditis She has not done well despite cutting the compounded medications back to 1/4 the total dose.  She would like to eliminate the T3 and try Levoxyl again - levothyroxine (SYNTHROID) 100 MCG tablet; Take 1 tablet (100 mcg total) by mouth daily.  Dispense: 90 tablet; Refill: 1  2. Rash Appears to be tinea Recommend topical and cover loosely to avoid irritation - clotrimazole-betamethasone (LOTRISONE) cream; Apply 1 Application topically daily. To rash on neck  Dispense: 30 g; Refill: 0  3. Essential hypertension Clinically stable exam with well controlled BP. Tolerating medications without side effects at this time. Pt to continue current regimen and low sodium diet; benefits of regular exercise as able discussed.  4. Arthritis Primarily of the left knee - try Voltaren gel  Follow up with Ortho if needed - diclofenac Sodium (VOLTAREN) 1 % GEL; Apply 4 g topically 4 (four) times  daily.  Dispense: 100 g; Refill: 0  5. Environmental and seasonal allergies - fluticasone (FLONASE) 50 MCG/ACT nasal spray; SPRAY 2 SPRAYS INTO BOTH NOSTRILS DAILY  Dispense: 16 g; Refill: 2   Partially dictated using Editor, commissioning. Any errors are unintentional.  Halina Maidens, MD [facility]  01/19/2022

## 2022-01-19 NOTE — Progress Notes (Signed)
Called pt, viewed on MyChart, no questions or concerns.

## 2022-01-21 ENCOUNTER — Encounter: Payer: Self-pay | Admitting: Internal Medicine

## 2022-01-21 ENCOUNTER — Telehealth: Payer: Self-pay | Admitting: Internal Medicine

## 2022-01-21 NOTE — Telephone Encounter (Signed)
Patient states she discussed w/ provider being prescribed levoxyl for her thyroid  Patient state levothyroxine (SYNTHROID) 100 MCG tablet will not work for her  Patient inquiring if provider can prescribe levoxyl instead  Please assist patient further

## 2022-01-25 ENCOUNTER — Encounter: Payer: Medicare HMO | Admitting: Internal Medicine

## 2022-01-27 DIAGNOSIS — Z6841 Body Mass Index (BMI) 40.0 and over, adult: Secondary | ICD-10-CM | POA: Diagnosis not present

## 2022-01-27 DIAGNOSIS — M1712 Unilateral primary osteoarthritis, left knee: Secondary | ICD-10-CM | POA: Diagnosis not present

## 2022-02-08 ENCOUNTER — Ambulatory Visit (INDEPENDENT_AMBULATORY_CARE_PROVIDER_SITE_OTHER): Payer: Medicare HMO | Admitting: Internal Medicine

## 2022-02-08 ENCOUNTER — Encounter: Payer: Self-pay | Admitting: Internal Medicine

## 2022-02-08 VITALS — BP 166/80 | HR 86 | Ht 61.0 in | Wt 224.0 lb

## 2022-02-08 DIAGNOSIS — I10 Essential (primary) hypertension: Secondary | ICD-10-CM | POA: Diagnosis not present

## 2022-02-08 DIAGNOSIS — Z1231 Encounter for screening mammogram for malignant neoplasm of breast: Secondary | ICD-10-CM

## 2022-02-08 DIAGNOSIS — F3341 Major depressive disorder, recurrent, in partial remission: Secondary | ICD-10-CM | POA: Diagnosis not present

## 2022-02-08 DIAGNOSIS — Z0000 Encounter for general adult medical examination without abnormal findings: Secondary | ICD-10-CM | POA: Diagnosis not present

## 2022-02-08 DIAGNOSIS — E038 Other specified hypothyroidism: Secondary | ICD-10-CM | POA: Diagnosis not present

## 2022-02-08 DIAGNOSIS — E782 Mixed hyperlipidemia: Secondary | ICD-10-CM | POA: Diagnosis not present

## 2022-02-08 DIAGNOSIS — M1712 Unilateral primary osteoarthritis, left knee: Secondary | ICD-10-CM

## 2022-02-08 DIAGNOSIS — R739 Hyperglycemia, unspecified: Secondary | ICD-10-CM

## 2022-02-08 DIAGNOSIS — E063 Autoimmune thyroiditis: Secondary | ICD-10-CM | POA: Diagnosis not present

## 2022-02-08 MED ORDER — FLUOXETINE HCL 20 MG PO CAPS
ORAL_CAPSULE | ORAL | 3 refills | Status: DC
Start: 2022-02-08 — End: 2023-02-10

## 2022-02-08 NOTE — Progress Notes (Signed)
Date:  02/08/2022   Name:  Deanna Johnson   DOB:  [birth year]   MRN:  [id number]   Chief Complaint: Annual Exam Deanna Johnson is a 71 y.o. female who presents today for her Complete Annual Exam. She feels fairly well. She reports exercising none. She reports she is sleeping poorly. Breast complaints - none.  Knee pain is disrupting sleep - some improvement after steroid injection.  May need Synovisc.   Mammogram: 06/2021 DEXA: 02/2020 Normal Pap smear: discontinued Colonoscopy: 07/2017 repeat 5 yrs  Health Maintenance Due  Topic Date Due   Zoster Vaccines- Shingrix (1 of 2) Never done    Immunization History  Administered Date(s) Administered   Fluad Quad(high Dose 65+) 04/01/2019   Influenza, High Dose Seasonal PF 04/10/2017   Influenza,inj,Quad PF,6+ Mos 04/11/2016   Influenza,inj,quad, With Preservative 05/18/2018   Influenza-Unspecified 08/01/2011, 04/02/2014, 04/06/2020   Moderna Sars-Covid-2 Vaccination 11/06/2020   PFIZER(Purple Top)SARS-COV-2 Vaccination 08/11/2019, 08/31/2019, 04/17/2020   Pneumococcal Conjugate-13 11/27/2014   Pneumococcal Polysaccharide-23 04/11/2016   Tdap 08/14/2013   Zoster, Live 08/14/2013    Hypertension This is a chronic problem. The problem is controlled. Pertinent negatives include no chest pain, headaches, palpitations or shortness of breath. Past treatments include diuretics and ACE inhibitors. The current treatment provides significant improvement. There is no history of kidney disease, CAD/MI or CVA. Identifiable causes of hypertension include a thyroid problem.  Thyroid Problem Presents for follow-up visit. Patient reports no anxiety, constipation, diarrhea, fatigue, palpitations or tremors. The symptoms have been stable.  Depression        This is a chronic problem.The problem is unchanged.  Associated symptoms include no fatigue and no headaches.  Past treatments include SSRIs - Selective serotonin reuptake inhibitors.  Compliance with  treatment is good.  Past medical history includes thyroid problem.     Lab Results  Component Value Date   NA 142 01/22/2021   K 4.9 01/22/2021   CO2 25 01/22/2021   GLUCOSE 96 01/22/2021   BUN 16 01/22/2021   CREATININE 0.97 01/22/2021   CALCIUM 9.6 01/22/2021   EGFR 62 01/22/2021   GFRNONAA 68 10/28/2019   Lab Results  Component Value Date   CHOL 282 (H) 01/22/2021   HDL 74 01/22/2021   LDLCALC 177 (H) 01/22/2021   TRIG 169 (H) 01/22/2021   CHOLHDL 3.8 01/22/2021   Lab Results  Component Value Date   TSH 13.100 (H) 01/13/2022   No results found for: "HGBA1C" Lab Results  Component Value Date   WBC 8.2 01/22/2021   HGB 14.7 01/22/2021   HCT 43.7 01/22/2021   MCV 83 01/22/2021   PLT 283 01/22/2021   Lab Results  Component Value Date   ALT 12 01/22/2021   AST 13 01/22/2021   ALKPHOS 74 01/22/2021   BILITOT 0.9 01/22/2021   Lab Results  Component Value Date   VD25OH 40.5 04/24/2018     Review of Systems  Constitutional:  Positive for fever. Negative for chills, fatigue and unexpected weight change.  HENT:  Negative for congestion, hearing loss, tinnitus, trouble swallowing and voice change.   Eyes:  Negative for visual disturbance.  Respiratory:  Negative for cough, chest tightness, shortness of breath and wheezing.   Cardiovascular:  Negative for chest pain, palpitations and leg swelling.  Gastrointestinal:  Negative for abdominal pain, constipation, diarrhea and vomiting.  Endocrine: Negative for polydipsia and polyuria.  Genitourinary:  Negative for dysuria, frequency, genital sores, vaginal bleeding and vaginal discharge.  Musculoskeletal:  Positive for arthralgias and gait problem. Negative for joint swelling.  Skin:  Negative for color change and rash.  Neurological:  Negative for dizziness, tremors, light-headedness and headaches.  Hematological:  Negative for adenopathy. Does not bruise/bleed easily.  Psychiatric/Behavioral:  Positive for depression.  Negative for dysphoric mood and sleep disturbance. The patient is not nervous/anxious.     Patient Active Problem List   Diagnosis Date Noted   Lumbar radiculitis 04/29/2020   Cervical spondylosis with myelopathy 10/25/2018   BMI 40.0-44.9, adult ([place]) 06/27/2018   Hyperlipidemia, mixed 05/05/2017   Hypothyroidism due to Hashimoto's thyroiditis 05/05/2017   Recurrent major depression in partial remission ([place]) 04/11/2016   Hx of colonic polyp 04/11/2016   Arthritis 12/31/2014   Essential hypertension 12/31/2014   Impingement syndrome of left shoulder 12/16/2014   H/O fibromyalgia 08/18/2012    No Known Allergies  Past Surgical History:  Procedure Laterality Date   BREAST BIOPSY Right 1987   neg   BREAST SURGERY     RIGHT BREAST BIOPSY   BUNIONECTOMY     CATARACT EXTRACTION W/PHACO Left 01/04/2019   Procedure: CATARACT EXTRACTION PHACO AND INTRAOCULAR LENS PLACEMENT ([facility])  LEFT;  Surgeon: Eulogio Bear, MD;  Location: [facility];  Service: Ophthalmology;  Laterality: Left;   CATARACT EXTRACTION W/PHACO Right 02/01/2019   Procedure: CATARACT EXTRACTION PHACO AND INTRAOCULAR LENS PLACEMENT ([facility])  RIGHT;  Surgeon: Eulogio Bear, MD;  Location: [facility];  Service: Ophthalmology;  Laterality: Right;   COLONOSCOPY  2011   benign polyps - f/u 2016   COLONOSCOPY WITH PROPOFOL N/A 07/21/2017   Procedure: COLONOSCOPY WITH PROPOFOL;  Surgeon: Lollie Sails, MD;  Location: [facility] ENDOSCOPY;  Service: Endoscopy;  Laterality: N/A;   EYE SURGERY  June & July 2020   cataract- both eyes   FRACTURE SURGERY     MAXILLARY FACIAL SURGERY   MANDIBLE FRACTURE SURGERY     jaw resection   shoulder arthroscopy Right    SHOULDER ARTHROSCOPY WITH OPEN ROTATOR CUFF REPAIR Left 01/30/2015   Procedure: SHOULDER ARTHROSCOPY WITH POSS OPEN ROTATOR CUFF REPAIR;  Surgeon: Leanor Kail, MD;  Location: [facility];  Service: Orthopedics;  Laterality: Left;  Release of  long head of biceps tendon Subacromial decompression Mini open rotator cuff repair   SPINE SURGERY  February 23, 2021   ACDF surgery C4-C7   TONSILLECTOMY     TOTAL ABDOMINAL HYSTERECTOMY     TUBAL LIGATION  1978    Social History   Tobacco Use   Smoking status: Former    Packs/day: 0.25    Years: 3.00    Total pack years: 0.75    Types: Cigarettes    Quit date: 07/18/1970    Years since quitting: 51.5   Smokeless tobacco: Never   Tobacco comments:    quit at age 22  Vaping Use   Vaping Use: Never used  Substance Use Topics   Alcohol use: No   Drug use: No     Medication list has been reviewed and updated.  Current Meds  Medication Sig   betamethasone dipropionate 0.05 % cream Apply 1 Application topically 2 (two) times daily.   diclofenac Sodium (VOLTAREN) 1 % GEL Apply 4 g topically 4 (four) times daily.   FLUoxetine (PROZAC) 20 MG capsule TAKE (1) CAPSULE BY MOUTH EVERY DAY   fluticasone (FLONASE) 50 MCG/ACT nasal spray SPRAY 2 SPRAYS INTO BOTH NOSTRILS DAILY   hydrochlorothiazide (HYDRODIURIL) 25 MG tablet TAKE (1) TABLET BY MOUTH  EVERY DAY   levothyroxine (SYNTHROID) 100 MCG tablet Take 1 tablet (100 mcg total) by mouth daily.   lisinopril (ZESTRIL) 20 MG tablet TAKE (1) TABLET BY MOUTH EVERY DAY   Magnesium Gluconate 550 MG TABS Take by mouth.   Multiple Vitamins-Minerals (CENTRUM SILVER PO) Take by mouth.       02/08/2022   10:54 AM 01/19/2022   11:16 AM 01/22/2021   10:52 AM 04/29/2020    1:31 PM  GAD 7 : Generalized Anxiety Score  Nervous, Anxious, on Edge 0 1 0 0  Control/stop worrying 0 0 0 0  Worry too much - different things 1 0 0 0  Trouble relaxing 0 0 0 0  Restless 0 0 0 0  Easily annoyed or irritable 1 1 0 0  Afraid - awful might happen 0 0 0 0  Total GAD 7 Score 2 2 0 0  Anxiety Difficulty Somewhat difficult Not difficult at all  Not difficult at all       02/08/2022   10:54 AM 01/19/2022   11:16 AM 04/05/2021    3:08 PM  Depression screen PHQ  2/9  Decreased Interest 0 2 0  Down, Depressed, Hopeless 1 1 0  PHQ - 2 Score 1 3 0  Altered sleeping 1 2   Tired, decreased energy 3 3   Change in appetite 0 0   Feeling bad or failure about yourself  0 0   Trouble concentrating 1 1   Moving slowly or fidgety/restless 0 0   Suicidal thoughts 0 0   PHQ-9 Score 6 9   Difficult doing work/chores Not difficult at all Very difficult     BP Readings from Last 3 Encounters:  02/08/22 (!) 166/80  01/19/22 132/82  01/22/21 138/78    Physical Exam Vitals and nursing note reviewed.  Constitutional:      General: She is not in acute distress.    Appearance: She is well-developed.  HENT:     Head: Normocephalic and atraumatic.     Right Ear: Tympanic membrane and ear canal normal.     Left Ear: Tympanic membrane and ear canal normal.     Nose:     Right Sinus: No maxillary sinus tenderness.     Left Sinus: No maxillary sinus tenderness.  Eyes:     General: No scleral icterus.       Right eye: No discharge.        Left eye: No discharge.     Conjunctiva/sclera: Conjunctivae normal.  Neck:     Thyroid: No thyromegaly.     Vascular: No carotid bruit.  Cardiovascular:     Rate and Rhythm: Normal rate and regular rhythm.     Pulses: Normal pulses.     Heart sounds: Normal heart sounds.  Pulmonary:     Effort: Pulmonary effort is normal. No respiratory distress.     Breath sounds: No wheezing.  Chest:  Breasts:    Right: No mass, nipple discharge, skin change or tenderness.     Left: No mass, nipple discharge, skin change or tenderness.  Abdominal:     General: Bowel sounds are normal.     Palpations: Abdomen is soft.     Tenderness: There is no abdominal tenderness.  Musculoskeletal:     Cervical back: Normal range of motion. No erythema.     Left knee: Decreased range of motion. Tenderness present.     Right lower leg: No edema.     Left lower  leg: No edema.  Lymphadenopathy:     Cervical: No cervical adenopathy.   Skin:    General: Skin is warm and dry.     Findings: No rash.  Neurological:     General: No focal deficit present.     Mental Status: She is alert and oriented to person, place, and time.     Cranial Nerves: No cranial nerve deficit.     Sensory: No sensory deficit.     Deep Tendon Reflexes: Reflexes are normal and symmetric.  Psychiatric:        Attention and Perception: Attention normal.        Mood and Affect: Mood normal.     Wt Readings from Last 3 Encounters:  02/08/22 224 lb (101.6 kg)  01/19/22 223 lb (101.2 kg)  01/22/21 221 lb (100.2 kg)    BP (!) 166/80 (BP Location: Left Arm, Cuff Size: Large)   Pulse 86   Ht 5' 1"  (1.549 m)   Wt 224 lb (101.6 kg)   SpO2 95%   BMI 42.32 kg/m   Assessment and Plan: 1. Annual physical exam Exam is normal except for weight. Encourage regular exercise such as swimming and appropriate dietary changes. Up to date on screenings and immunizations. Colonoscopy due in Jan 2024 She has had the first Shingrix - will get the second dose in 1-2 months.  1. Essential hypertension BP elevated today due to large doses of Aleve for knee pain and knee pain Continue current medications; monitor at home and follow up sooner than planned if persistently elevated. Try to wean off NSAIDS as soon as possible - CBC with Differential/Platelet - Comprehensive metabolic panel  2. Hypothyroidism due to Hashimoto's thyroiditis Now on generic synthroid.  Will return in 2 mo for labs - TSH+T4F+T3Free; Future  3. Recurrent major depression in partial remission (H[facility]) Clinically stable on current regimen with good control of symptoms, No SI or HI. Some down days due to pain and inability to do everything she wants. Will continue current therapy. - FLUoxetine (PROZAC) 20 MG capsule; TAKE (1) CAPSULE BY MOUTH EVERY DAY  Dispense: 90 capsule; Refill: 3  4. Hyperlipidemia, mixed Continue low fat diet - Lipid panel  5. Hyperglycemia Rule out  pre-diabetes. - Hemoglobin A1c  6. Encounter for screening mammogram for breast cancer Schedule in December - MM 3D SCREEN BREAST BILATERAL  8. Primary osteoarthritis of left knee Follow up with Ortho   Partially dictated using Editor, commissioning. Any errors are unintentional.  Halina Maidens, MD [facility]  02/08/2022

## 2022-02-09 LAB — COMPREHENSIVE METABOLIC PANEL
ALT: 8 IU/L (ref 0–32)
AST: 14 IU/L (ref 0–40)
Albumin/Globulin Ratio: 1.7 (ref 1.2–2.2)
Albumin: 4.2 g/dL (ref 3.8–4.8)
Alkaline Phosphatase: 67 IU/L (ref 44–121)
BUN/Creatinine Ratio: 19 (ref 12–28)
BUN: 19 mg/dL (ref 8–27)
Bilirubin Total: 0.6 mg/dL (ref 0.0–1.2)
CO2: 22 mmol/L (ref 20–29)
Calcium: 9.4 mg/dL (ref 8.7–10.3)
Chloride: 103 mmol/L (ref 96–106)
Creatinine, Ser: 1.01 mg/dL — ABNORMAL HIGH (ref 0.57–1.00)
Globulin, Total: 2.5 g/dL (ref 1.5–4.5)
Glucose: 99 mg/dL (ref 70–99)
Potassium: 4.1 mmol/L (ref 3.5–5.2)
Sodium: 142 mmol/L (ref 134–144)
Total Protein: 6.7 g/dL (ref 6.0–8.5)
eGFR: 59 mL/min/{1.73_m2} — ABNORMAL LOW (ref >59)

## 2022-02-09 LAB — CBC WITH DIFFERENTIAL/PLATELET
Basophils Absolute: 0 10*3/uL (ref 0.0–0.2)
Basos: 1 %
EOS (ABSOLUTE): 0.2 10*3/uL (ref 0.0–0.4)
Eos: 4 %
Hematocrit: 42.3 % (ref 34.0–46.6)
Hemoglobin: 13.8 g/dL (ref 11.1–15.9)
Immature Grans (Abs): 0 10*3/uL (ref 0.0–0.1)
Immature Granulocytes: 0 %
Lymphocytes Absolute: 1.8 10*3/uL (ref 0.7–3.1)
Lymphs: 30 %
MCH: 28.1 pg (ref 26.6–33.0)
MCHC: 32.6 g/dL (ref 31.5–35.7)
MCV: 86 fL (ref 79–97)
Monocytes Absolute: 0.5 10*3/uL (ref 0.1–0.9)
Monocytes: 8 %
Neutrophils Absolute: 3.4 10*3/uL (ref 1.4–7.0)
Neutrophils: 57 %
Platelets: 271 10*3/uL (ref 150–450)
RBC: 4.91 x10E6/uL (ref 3.77–5.28)
RDW: 13.6 % (ref 11.7–15.4)
WBC: 5.9 10*3/uL (ref 3.4–10.8)

## 2022-02-09 LAB — LIPID PANEL
Chol/HDL Ratio: 3.4 ratio (ref 0.0–4.4)
Cholesterol, Total: 263 mg/dL — ABNORMAL HIGH (ref 100–199)
HDL: 78 mg/dL (ref >39)
LDL Chol Calc (NIH): 168 mg/dL — ABNORMAL HIGH (ref 0–99)
Triglycerides: 99 mg/dL (ref 0–149)
VLDL Cholesterol Cal: 17 mg/dL (ref 5–40)

## 2022-02-09 LAB — HEMOGLOBIN A1C
Est. average glucose Bld gHb Est-mCnc: 111 mg/dL
Hgb A1c MFr Bld: 5.5 % (ref 4.8–5.6)

## 2022-03-17 DIAGNOSIS — M1712 Unilateral primary osteoarthritis, left knee: Secondary | ICD-10-CM | POA: Diagnosis not present

## 2022-03-24 DIAGNOSIS — M1712 Unilateral primary osteoarthritis, left knee: Secondary | ICD-10-CM | POA: Diagnosis not present

## 2022-03-31 DIAGNOSIS — M1712 Unilateral primary osteoarthritis, left knee: Secondary | ICD-10-CM | POA: Diagnosis not present

## 2022-04-06 ENCOUNTER — Ambulatory Visit (INDEPENDENT_AMBULATORY_CARE_PROVIDER_SITE_OTHER): Payer: Medicare HMO

## 2022-04-06 VITALS — Ht 61.0 in | Wt 224.0 lb

## 2022-04-06 DIAGNOSIS — Z0000 Encounter for general adult medical examination without abnormal findings: Secondary | ICD-10-CM | POA: Diagnosis not present

## 2022-04-06 NOTE — Progress Notes (Signed)
Subjective:   Deanna Johnson is a 71 y.o. female who presents for Medicare Annual (Subsequent) preventive examination.  I connected with  Deanna Johnson on 04/06/22 by a audio enabled telemedicine application and verified that I am speaking with the correct person using two identifiers.  Patient Location: Home  Provider Location: Office/Clinic  I discussed the limitations of evaluation and management by telemedicine. The patient expressed understanding and agreed to proceed.   Review of Systems    Defer to PCP Cardiac Risk Factors include: advanced age (>93mn, >>83women);obesity (BMI >30kg/m2)     Objective:    Today's Vitals   04/06/22 1504 04/06/22 1509  Weight: 224 lb (101.6 kg)   Height: '5\' 1"'$  (1.549 m)   PainSc:  0-No pain   Body mass index is 42.32 kg/m.     04/06/2022    3:11 PM 04/05/2021    3:12 PM 02/24/2020    9:40 AM 02/01/2019    7:09 AM 01/21/2019   12:08 PM 01/04/2019    1:19 PM 07/21/2017   12:25 PM  Advanced Directives  Does Patient Have a Medical Advance Directive? Yes Yes Yes Yes Yes Yes Yes  Type of ACorporate treasurerof ASalinasLiving will H[place]Living will HWalkerLiving will HBanks Lake SouthLiving will HRivertonLiving will HCounty LineLiving will  Does patient want to make changes to medical advance directive? No - Patient declined   No - Patient declined     Copy of [place]in Chart?  Yes - validated most recent copy scanned in chart (See row information) Yes - validated most recent copy scanned in chart (See row information) Yes - validated most recent copy scanned in chart (See row information) No - copy requested No - copy requested No - copy requested    Current Medications (verified) Outpatient Encounter Medications as of 04/06/2022  Medication Sig   betamethasone dipropionate 0.05 % cream Apply 1 Application  topically 2 (two) times daily.   diclofenac Sodium (VOLTAREN) 1 % GEL Apply 4 g topically 4 (four) times daily.   FLUoxetine (PROZAC) 20 MG capsule TAKE (1) CAPSULE BY MOUTH EVERY DAY   fluticasone (FLONASE) 50 MCG/ACT nasal spray SPRAY 2 SPRAYS INTO BOTH NOSTRILS DAILY   hydrochlorothiazide (HYDRODIURIL) 25 MG tablet TAKE (1) TABLET BY MOUTH EVERY DAY   levothyroxine (SYNTHROID) 100 MCG tablet Take 1 tablet (100 mcg total) by mouth daily.   lisinopril (ZESTRIL) 20 MG tablet TAKE (1) TABLET BY MOUTH EVERY DAY   Magnesium Gluconate 550 MG TABS Take by mouth.   Multiple Vitamins-Minerals (CENTRUM SILVER PO) Take by mouth.   No facility-administered encounter medications on file as of 04/06/2022.    Allergies (verified) Patient has no known allergies.   History: Past Medical History:  Diagnosis Date   Allergy    Arthritis    Complete tear of rotator cuff    RIGHT   Depression    Fibromyalgia    Heart murmur    slight no problems   History of tick-borne disease    Hypertension    Hypothyroidism    Shoulder pain, left    TORN ROTATOR CUFF   Past Surgical History:  Procedure Laterality Date   BREAST BIOPSY Right 1987   neg   BREAST SURGERY     RIGHT BREAST BIOPSY   BUNIONECTOMY     CATARACT EXTRACTION W/PHACO Left 01/04/2019   Procedure: CATARACT EXTRACTION  PHACO AND INTRAOCULAR LENS PLACEMENT (IOC)  LEFT;  Surgeon: Eulogio Bear, MD;  Location: [place];  Service: Ophthalmology;  Laterality: Left;   CATARACT EXTRACTION W/PHACO Right 02/01/2019   Procedure: CATARACT EXTRACTION PHACO AND INTRAOCULAR LENS PLACEMENT (IOC)  RIGHT;  Surgeon: Eulogio Bear, MD;  Location: [place];  Service: Ophthalmology;  Laterality: Right;   COLONOSCOPY  2011   benign polyps - f/u 2016   COLONOSCOPY WITH PROPOFOL N/A 07/21/2017   Procedure: COLONOSCOPY WITH PROPOFOL;  Surgeon: Lollie Sails, MD;  [facility] ENDOSCOPY;  Service: Endoscopy;  Laterality: N/A;    EYE SURGERY  June & July 2020   cataract- both eyes   FRACTURE SURGERY     MAXILLARY FACIAL SURGERY   MANDIBLE FRACTURE SURGERY     jaw resection   shoulder arthroscopy Right    SHOULDER ARTHROSCOPY WITH OPEN ROTATOR CUFF REPAIR Left 01/30/2015   Procedure: SHOULDER ARTHROSCOPY WITH POSS OPEN ROTATOR CUFF REPAIR;  Surgeon: Leanor Kail, MD;  Location: [place];  Service: Orthopedics;  Laterality: Left;  Release of long head of biceps tendon Subacromial decompression Mini open rotator cuff repair   SPINE SURGERY  February 23, 2021   ACDF surgery C4-C7   TONSILLECTOMY     TOTAL ABDOMINAL HYSTERECTOMY     TUBAL LIGATION  1978   Family History  Problem Relation Age of Onset   Breast cancer Maternal Aunt 62   Diabetes Maternal Aunt    Hypertension Mother    Bursitis Mother    Hearing loss Mother    Early death Father    Social History   Socioeconomic History   Marital status: Divorced    Spouse name: Not on file   Number of children: 1   Years of education: Not on file   Highest education level: Not on file  Occupational History   Occupation: retired  Tobacco Use   Smoking status: Former    Packs/day: 0.25    Years: 3.00    Total pack years: 0.75    Types: Cigarettes    Quit date: 07/18/1970    Years since quitting: 51.7   Smokeless tobacco: Never   Tobacco comments:    quit at age 59  Vaping Use   Vaping Use: Never used  Substance and Sexual Activity   Alcohol use: No   Drug use: No   Sexual activity: Not Currently    Birth control/protection: Post-menopausal  Other Topics Concern   Not on file  Social History Narrative   Pt lives alone.    Social Determinants of Health   Financial Resource Strain: Low Risk  (04/06/2022)   Overall Financial Resource Strain (CARDIA)    Difficulty of Paying Living Expenses: Not hard at all  Food Insecurity: No Food Insecurity (04/06/2022)   Hunger Vital Sign    Worried About Running Out of Food in the Last Year:  Never true    Ran Out of Food in the Last Year: Never true  Transportation Needs: No Transportation Needs (04/06/2022)   PRAPARE - Hydrologist (Medical): No    Lack of Transportation (Non-Medical): No  Physical Activity: Insufficiently Active (04/06/2022)   Exercise Vital Sign    Days of Exercise per Week: 3 days    Minutes of Exercise per Session: 20 min  Stress: No Stress Concern Present (04/06/2022)   [place]    Feeling of Stress : Not at  all  Social Connections: Socially Isolated (04/06/2022)   Social Connection and Isolation Panel [NHANES]    Frequency of Communication with Friends and Family: More than three times a week    Frequency of Social Gatherings with Friends and Family: More than three times a week    Attends Religious Services: Never    Marine scientist or Organizations: No    Attends Music therapist: Never    Marital Status: Divorced    Tobacco Counseling Counseling given: Not Answered Tobacco comments: quit at age 34   Clinical Intake:  Pre-visit preparation completed: Yes  Pain : No/denies pain Pain Score: 0-No pain     BMI - recorded: 42.32 Nutritional Status: BMI > 30  Obese Nutritional Risks: None Diabetes: No     Diabetic? No.  Interpreter Needed?: No  Information entered by :: Deanna Johnson, [place] of Daily Living    04/06/2022    3:13 PM  In your present state of health, do you have any difficulty performing the following activities:  Hearing? 0  Vision? 0  Difficulty concentrating or making decisions? 0  Walking or climbing stairs? 1  Dressing or bathing? 0  Doing errands, shopping? 0  Preparing Food and eating ? N  Using the Toilet? N  In the past six months, have you accidently leaked urine? N  Do you have problems with loss of bowel control? N  Managing your Medications? N  Managing your Finances? N   Housekeeping or managing your Housekeeping? N    Patient Care Team: Glean Hess, MD as PCP - General (Internal Medicine) Sharlet Salina, MD as Referring Physician (Physical Medicine and Rehabilitation) Blanche East, MD as Referring Physician (Neurosurgery) Cira Servant, DO (Rehabilitation)  Indicate any recent Medical Services you may have received from other than [facility] providers in the past year (date may be approximate).     Assessment:   This is a routine wellness examination for Deanna Johnson.  Hearing/Vision screen Hearing Screening - Comments:: No concerns. Vision Screening - Comments:: Wears prescription glasses.  Dietary issues and exercise activities discussed:     Goals Addressed   None   Depression Screen    04/06/2022    3:13 PM 02/08/2022   10:54 AM 01/19/2022   11:16 AM 04/05/2021    3:08 PM 01/22/2021   10:52 AM 04/29/2020    1:31 PM 02/24/2020    9:38 AM  PHQ 2/9 Scores  PHQ - 2 Score 0 1 3 0 2 0 1  PHQ- 9 Score '4 6 9  5 '$ 0     Fall Risk    04/06/2022    3:13 PM 02/08/2022   10:54 AM 01/19/2022   11:16 AM 04/05/2021    3:14 PM 01/22/2021   10:52 AM  Fall Risk   Falls in the past year? 0 0 0 0 0  Number falls in past yr: 0 0 0 0   Injury with Fall? 0 0 0 0   Risk for fall due to : No Fall Risks Impaired balance/gait No Fall Risks Impaired balance/gait   Follow up Falls evaluation completed Falls evaluation completed Falls evaluation completed Falls prevention discussed Falls evaluation completed    FALL RISK PREVENTION PERTAINING TO THE HOME:  Any stairs in or around the home? Yes  If so, are there any without handrails? Yes  Home free of loose throw rugs in walkways, pet beds, electrical cords, etc? Yes  Adequate lighting in your home to  reduce risk of falls? Yes   ASSISTIVE DEVICES UTILIZED TO PREVENT FALLS:  Life alert? No  Use of a cane, walker or w/c? No  Grab bars in the bathroom? Yes  Shower chair or bench in shower? No  Elevated  toilet seat or a handicapped toilet? No    Cognitive Function:        04/06/2022    3:13 PM 01/21/2019   12:19 PM 04/10/2017   11:28 AM 04/11/2016    8:47 AM  6CIT Screen  What Year? 0 points 0 points 0 points 0 points  What month? 0 points 0 points 0 points 0 points  What time? 0 points 0 points 0 points 0 points  Count back from 20 0 points 0 points 0 points 0 points  Months in reverse 0 points 0 points 0 points 0 points  Repeat phrase 0 points 0 points 2 points 0 points  Total Score 0 points 0 points 2 points 0 points    Immunizations Immunization History  Administered Date(s) Administered   Fluad Quad(high Dose 65+) 04/01/2019   Influenza, High Dose Seasonal PF 04/10/2017   Influenza,inj,Quad PF,6+ Mos 04/11/2016   Influenza,inj,quad, With Preservative 05/18/2018   Influenza-Unspecified 08/01/2011, 04/02/2014, 04/06/2020   Moderna Sars-Covid-2 Vaccination 11/06/2020   PFIZER(Purple Top)SARS-COV-2 Vaccination 08/11/2019, 08/31/2019, 04/17/2020   Pneumococcal Conjugate-13 11/27/2014   Pneumococcal Polysaccharide-23 04/11/2016   Tdap 08/14/2013   Zoster, Live 08/14/2013    TDAP status: Up to date  Flu Vaccine status: Due, Education has been provided regarding the importance of this vaccine. Advised may receive this vaccine at local pharmacy or Health Dept. Aware to provide a copy of the vaccination record if obtained from local pharmacy or Health Dept. Verbalized acceptance and understanding.  Pneumococcal vaccine status: Up to date  Covid-19 vaccine status: Completed vaccines  Qualifies for Shingles Vaccine? Yes   Zostavax completed No   Shingrix Completed?: No.    Education has been provided regarding the importance of this vaccine. Patient has been advised to call insurance company to determine out of pocket expense if they have not yet received this vaccine. Advised may also receive vaccine at local pharmacy or Health Dept. Verbalized acceptance and  understanding.  Screening Tests Health Maintenance  Topic Date Due   Zoster Vaccines- Shingrix (1 of 2) Never done   INFLUENZA VACCINE  02/15/2022   COVID-19 Vaccine (5 - Pfizer risk series) 04/22/2022 (Originally 01/01/2021)   MAMMOGRAM  07/14/2022   COLONOSCOPY (Pts 45-68yr Insurance coverage will need to be confirmed)  07/21/2022   TETANUS/TDAP  08/15/2023   Pneumonia Vaccine 72 Years old  Completed   DEXA SCAN  Completed   Hepatitis C Screening  Addressed   HPV VACCINES  Aged Out    Health Maintenance  Health Maintenance Due  Topic Date Due   Zoster Vaccines- Shingrix (1 of 2) Never done   INFLUENZA VACCINE  02/15/2022    Colorectal cancer screening: Type of screening: Colonoscopy. Completed 07/21/2017. Repeat every 5 years  Mammogram status: Completed 07/14/2021. Repeat every year  Bone Density status: Completed 03/17/2020. Results reflect: Bone density results: NORMAL. Repeat every 3-5 years.  Lung Cancer Screening: (Low Dose CT Chest recommended if Age 72-80years, 30 pack-year currently smoking OR have quit w/in 15years.) does not qualify.   Lung Cancer Screening Referral: N/A  Additional Screening:  Hepatitis C Screening: does qualify; Completed 04/11/2016  Vision Screening: Recommended annual ophthalmology exams for early detection of glaucoma and other disorders of the eye. Is  the patient up to date with their annual eye exam?  Yes  Who is the provider or what is the name of the office in which the patient attends annual eye exams? [facility] If pt is not established with a provider, would they like to be referred to a provider to establish care?  N/A .   Dental Screening: Recommended annual dental exams for proper oral hygiene  Community Resource Referral / Chronic Care Management: CRR required this visit?  No   CCM required this visit?  No      Plan:     I have personally reviewed and noted the following in the patient's chart:    Medical and social history Use of alcohol, tobacco or illicit drugs  Current medications and supplements including opioid prescriptions. Patient is not currently taking opioid prescriptions. Functional ability and status Nutritional status Physical activity Advanced directives List of other physicians Hospitalizations, surgeries, and ER visits in previous 12 months Vitals Screenings to include cognitive, depression, and falls Referrals and appointments  In addition, I have reviewed and discussed with patient certain preventive protocols, quality metrics, and best practice recommendations. A written personalized care plan for preventive services as well as general preventive health recommendations were provided to patient.     Deanna Johnson, [place]   04/06/2022    Ms. Munroe , Thank you for taking time to come for your Medicare Wellness Visit. I appreciate your ongoing commitment to your health goals. Please review the following plan we discussed and let me know if I can assist you in the future.   These are the goals we discussed:  Goals      Increase physical activity     Recommend increasing physical activity to at least 3 days per week        This is a list of the screening recommended for you and due dates:  Health Maintenance  Topic Date Due   Zoster (Shingles) Vaccine (1 of 2) Never done   Flu Shot  02/15/2022   COVID-19 Vaccine (5 - Pfizer risk series) 04/22/2022*   Mammogram  07/14/2022   Colon Cancer Screening  07/21/2022   Tetanus Vaccine  08/15/2023   Pneumonia Vaccine  Completed   DEXA scan (bone density measurement)  Completed   Hepatitis C Screening: USPSTF Recommendation to screen - Ages 66-79 yo.  Addressed   HPV Vaccine  Aged Out  *Topic was postponed. The date shown is not the original due date.      Nurse Notes: None.

## 2022-04-17 DIAGNOSIS — I10 Essential (primary) hypertension: Secondary | ICD-10-CM | POA: Diagnosis not present

## 2022-04-17 DIAGNOSIS — R051 Acute cough: Secondary | ICD-10-CM | POA: Diagnosis not present

## 2022-04-17 DIAGNOSIS — J029 Acute pharyngitis, unspecified: Secondary | ICD-10-CM | POA: Diagnosis not present

## 2022-05-10 ENCOUNTER — Other Ambulatory Visit: Payer: Self-pay | Admitting: Internal Medicine

## 2022-05-10 DIAGNOSIS — I10 Essential (primary) hypertension: Secondary | ICD-10-CM

## 2022-05-11 NOTE — Telephone Encounter (Signed)
Requested medication (s) are due for refill today: expired medications  Requested medication (s) are on the active medication list: yes  Last refill:  05/01/21 #90 3 refills  Future visit scheduled: yes in 2 months  Notes to clinic:  expired medications do you want to renew Rxs?     Requested Prescriptions  Pending Prescriptions Disp Refills   hydrochlorothiazide (HYDRODIURIL) 25 MG tablet [Pharmacy Med Name: HYDROCHLOROTHIAZIDE 25 MG TAB] 90 tablet 3    Sig: TAKE (1) TABLET BY MOUTH EVERY DAY     Cardiovascular: Diuretics - Thiazide Failed - 05/10/2022  9:03 AM      Failed - Cr in normal range and within 180 days    Creatinine, Ser  Date Value Ref Range Status  02/08/2022 1.01 (H) 0.57 - 1.00 mg/dL Final         Failed - Last BP in normal range    BP Readings from Last 1 Encounters:  02/08/22 (!) 166/80         Passed - K in normal range and within 180 days    Potassium  Date Value Ref Range Status  02/08/2022 4.1 3.5 - 5.2 mmol/L Final         Passed - Na in normal range and within 180 days    Sodium  Date Value Ref Range Status  02/08/2022 142 134 - 144 mmol/L Final         Passed - Valid encounter within last 6 months    Recent Outpatient Visits           3 months ago Annual physical exam   [place] [facility] at [facility], Jesse Sans, MD   3 months ago Hypothyroidism due to Hashimoto's thyroiditis   [place] [facility] at [facility], Jesse Sans, MD   1 year ago Annual physical exam   [place] [facility] at [facility], Jesse Sans, MD   2 years ago Essential hypertension   [place] [facility] at [facility], Jesse Sans, MD   2 years ago Annual physical exam   [place] [facility] at [facility], Jesse Sans, MD       Future Appointments             In 2 months Glean Hess, MD [place] [facility] at [facility], [place]   In 2 months [place], Barbaraann Faster, NP [place], [facility]             lisinopril (ZESTRIL) 20 MG tablet [Pharmacy Med Name: LISINOPRIL 20 MG TAB] 90 tablet 3    Sig: TAKE (1) TABLET BY MOUTH EVERY DAY     Cardiovascular:  ACE Inhibitors Failed - 05/10/2022  9:03 AM      Failed - Cr in normal range and within 180 days    Creatinine, Ser  Date Value Ref Range Status  02/08/2022 1.01 (H) 0.57 - 1.00 mg/dL Final         Failed - Last BP in normal range    BP Readings from Last 1 Encounters:  02/08/22 (!) 166/80         Passed - K in normal range and within 180 days    Potassium  Date Value Ref Range Status  02/08/2022 4.1 3.5 - 5.2 mmol/L Final  Passed - Patient is not pregnant      Passed - Valid encounter within last 6 months    Recent Outpatient Visits           3 months ago Annual physical exam   [place] [facility] at [facility], Jesse Sans, MD   3 months ago Hypothyroidism due to Hashimoto's thyroiditis   [place] [facility] at [facility], Jesse Sans, MD   1 year ago Annual physical exam   [facility] at [facility], Jesse Sans, MD   2 years ago Essential hypertension   [place] [facility] at [facility], Jesse Sans, MD   2 years ago Annual physical exam   [facility] at [facility], Jesse Sans, MD       Future Appointments             In 2 months [place], Jesse Sans, MD [facility] Primary Care and [facility] Medicine at [facility], [facility]   In 2 months [place], Barbaraann Faster, NP [employer], [place]

## 2022-05-26 ENCOUNTER — Other Ambulatory Visit: Payer: Self-pay

## 2022-05-26 DIAGNOSIS — E063 Autoimmune thyroiditis: Secondary | ICD-10-CM

## 2022-05-30 DIAGNOSIS — E063 Autoimmune thyroiditis: Secondary | ICD-10-CM | POA: Diagnosis not present

## 2022-05-30 DIAGNOSIS — E038 Other specified hypothyroidism: Secondary | ICD-10-CM | POA: Diagnosis not present

## 2022-05-31 LAB — TSH+T4F+T3FREE
Free T4: 1.66 ng/dL (ref 0.82–1.77)
T3, Free: 2.5 pg/mL (ref 2.0–4.4)
TSH: 0.447 u[IU]/mL — ABNORMAL LOW (ref 0.450–4.500)

## 2022-06-14 ENCOUNTER — Other Ambulatory Visit: Payer: Self-pay | Admitting: Internal Medicine

## 2022-06-14 DIAGNOSIS — J3089 Other allergic rhinitis: Secondary | ICD-10-CM

## 2022-06-14 NOTE — Telephone Encounter (Signed)
Requested Prescriptions  Pending Prescriptions Disp Refills   fluticasone (FLONASE) 50 MCG/ACT nasal spray [Pharmacy Med Name: FLUTICASONE PROPIONATE 50 MCG/ACT N] 16 g 0    Sig: PLACE 2 SPRAYS INTO BOTH NOSTRILS DAILY     Ear, Nose, and Throat: Nasal Preparations - Corticosteroids Passed - 06/14/2022  8:47 AM      Passed - Valid encounter within last 12 months    Recent Outpatient Visits           4 months ago Annual physical exam   [place] [facility] at [facility], Jesse Sans, MD   4 months ago Hypothyroidism due to Hashimoto's thyroiditis   [place] [facility] at [facility], Jesse Sans, MD   1 year ago Annual physical exam   [facility] at [facility], Jesse Sans, MD   2 years ago Essential hypertension    Primary Care and Sports Medicine at [facility], Jesse Sans, MD   2 years ago Annual physical exam   [facility] at [facility], Jesse Sans, MD       Future Appointments             In 4 weeks [place] Mel[place], Jesse Sans, MD [facility] and [facility] at [facility], [place]   In 1 month [place], Barbaraann Faster, NP [employer], [place]

## 2022-07-12 ENCOUNTER — Ambulatory Visit (INDEPENDENT_AMBULATORY_CARE_PROVIDER_SITE_OTHER): Payer: Medicare HMO | Admitting: Internal Medicine

## 2022-07-12 ENCOUNTER — Encounter: Payer: Self-pay | Admitting: Internal Medicine

## 2022-07-12 VITALS — BP 150/68 | HR 89 | Ht 61.0 in | Wt 223.0 lb

## 2022-07-12 DIAGNOSIS — I10 Essential (primary) hypertension: Secondary | ICD-10-CM

## 2022-07-12 DIAGNOSIS — E038 Other specified hypothyroidism: Secondary | ICD-10-CM

## 2022-07-12 DIAGNOSIS — E559 Vitamin D deficiency, unspecified: Secondary | ICD-10-CM | POA: Diagnosis not present

## 2022-07-12 DIAGNOSIS — F3341 Major depressive disorder, recurrent, in partial remission: Secondary | ICD-10-CM | POA: Diagnosis not present

## 2022-07-12 DIAGNOSIS — Z6841 Body Mass Index (BMI) 40.0 and over, adult: Secondary | ICD-10-CM | POA: Diagnosis not present

## 2022-07-12 DIAGNOSIS — E063 Autoimmune thyroiditis: Secondary | ICD-10-CM

## 2022-07-12 DIAGNOSIS — M1712 Unilateral primary osteoarthritis, left knee: Secondary | ICD-10-CM | POA: Diagnosis not present

## 2022-07-12 MED ORDER — HYDROCHLOROTHIAZIDE 25 MG PO TABS: ORAL_TABLET | ORAL | 1 refills | Status: DC

## 2022-07-12 MED ORDER — LEVOTHYROXINE SODIUM 100 MCG PO TABS: 100.0000 ug | ORAL_TABLET | Freq: Every day | ORAL | 1 refills | Status: DC

## 2022-07-12 MED ORDER — LISINOPRIL 20 MG PO TABS: ORAL_TABLET | ORAL | 1 refills | Status: DC

## 2022-07-12 NOTE — Progress Notes (Signed)
Date:  07/12/2022   Name:  Deanna Johnson   DOB:  1950-07-23   MRN:  [id number]   Chief Complaint: Hypothyroidism and Hypertension  Hypertension Pertinent negatives include no chest pain, headaches, palpitations or shortness of breath. Identifiable causes of hypertension include a thyroid problem.  Thyroid Problem Presents for follow-up visit. Patient reports no anxiety, constipation, diarrhea, fatigue or palpitations. The symptoms have been stable.  Knee Pain  There was no injury mechanism. The pain is present in the left knee. The quality of the pain is described as aching and cramping. The pain has been Worsening since onset. The symptoms are aggravated by weight bearing. She has tried NSAIDs for the symptoms.    Lab Results  Component Value Date   NA 142 02/08/2022   K 4.1 02/08/2022   CO2 22 02/08/2022   GLUCOSE 99 02/08/2022   BUN 19 02/08/2022   CREATININE 1.01 (H) 02/08/2022   CALCIUM 9.4 02/08/2022   EGFR 59 (L) 02/08/2022   GFRNONAA 68 10/28/2019   Lab Results  Component Value Date   CHOL 263 (H) 02/08/2022   HDL 78 02/08/2022   LDLCALC 168 (H) 02/08/2022   TRIG 99 02/08/2022   CHOLHDL 3.4 02/08/2022   Lab Results  Component Value Date   TSH 0.447 (L) 05/30/2022   Lab Results  Component Value Date   HGBA1C 5.5 02/08/2022   Lab Results  Component Value Date   WBC 5.9 02/08/2022   HGB 13.8 02/08/2022   HCT 42.3 02/08/2022   MCV 86 02/08/2022   PLT 271 02/08/2022   Lab Results  Component Value Date   ALT 8 02/08/2022   AST 14 02/08/2022   ALKPHOS 67 02/08/2022   BILITOT 0.6 02/08/2022   Lab Results  Component Value Date   VD25OH 40.5 04/24/2018     Review of Systems  Constitutional:  Negative for fatigue and unexpected weight change.  HENT:  Negative for nosebleeds.   Eyes:  Negative for visual disturbance.  Respiratory:  Negative for cough, chest tightness, shortness of breath and wheezing.   Cardiovascular:  Negative for chest pain,  palpitations and leg swelling.  Gastrointestinal:  Negative for abdominal pain, constipation and diarrhea.  Musculoskeletal:  Positive for arthralgias and gait problem.  Neurological:  Negative for dizziness, weakness, light-headedness and headaches.  Psychiatric/Behavioral:  Negative for dysphoric mood and sleep disturbance. The patient is not nervous/anxious.     Patient Active Problem List   Diagnosis Date Noted   Lumbar radiculitis 04/29/2020   Cervical spondylosis with myelopathy 10/25/2018   BMI 40.0-44.9, adult ([place]) 06/27/2018   Hyperlipidemia, mixed 05/05/2017   Hypothyroidism due to Hashimoto's thyroiditis 05/05/2017   Recurrent major depression in partial remission ([place]) 04/11/2016   Hx of colonic polyp 04/11/2016   Degenerative arthritis of knee 12/31/2014   Essential hypertension 12/31/2014   Impingement syndrome of left shoulder 12/16/2014   H/O fibromyalgia 08/18/2012    No Known Allergies  Past Surgical History:  Procedure Laterality Date   BREAST BIOPSY Right 1987   neg   BREAST SURGERY     RIGHT BREAST BIOPSY   BUNIONECTOMY     CATARACT EXTRACTION W/PHACO Left 01/04/2019   Procedure: CATARACT EXTRACTION PHACO AND INTRAOCULAR LENS PLACEMENT ([place])  LEFT;  Surgeon: Eulogio Bear, MD;  Location: [place];  Service: Ophthalmology;  Laterality: Left;   CATARACT EXTRACTION W/PHACO Right 02/01/2019   Procedure: CATARACT EXTRACTION PHACO AND INTRAOCULAR LENS PLACEMENT ([place])  RIGHT;  Surgeon: Edison Pace,  Josie Saunders, MD;  Location: [place];  Service: Ophthalmology;  Laterality: Right;   COLONOSCOPY  2011   benign polyps - f/u 2016   COLONOSCOPY WITH PROPOFOL N/A 07/21/2017   Procedure: COLONOSCOPY WITH PROPOFOL;  Surgeon: Lollie Sails, MD;  Location: [facility] ENDOSCOPY;  Service: Endoscopy;  Laterality: N/A;   EYE SURGERY  June & July 2020   cataract- both eyes   FRACTURE SURGERY     MAXILLARY FACIAL SURGERY   MANDIBLE FRACTURE SURGERY      jaw resection   shoulder arthroscopy Right    SHOULDER ARTHROSCOPY WITH OPEN ROTATOR CUFF REPAIR Left 01/30/2015   Procedure: SHOULDER ARTHROSCOPY WITH POSS OPEN ROTATOR CUFF REPAIR;  Surgeon: Leanor Kail, MD;  Location: [facility];  Service: Orthopedics;  Laterality: Left;  Release of long head of biceps tendon Subacromial decompression Mini open rotator cuff repair   SPINE SURGERY  February 23, 2021   ACDF surgery C4-C7   TONSILLECTOMY     TOTAL ABDOMINAL HYSTERECTOMY     TUBAL LIGATION  1978    Social History   Tobacco Use   Smoking status: Former    Packs/day: 0.25    Years: 3.00    Total pack years: 0.75    Types: Cigarettes    Quit date: 07/18/1970    Years since quitting: 52.0   Smokeless tobacco: Never   Tobacco comments:    quit at age 33  Vaping Use   Vaping Use: Never used  Substance Use Topics   Alcohol use: No   Drug use: No     Medication list has been reviewed and updated.  Current Meds  Medication Sig   betamethasone dipropionate 0.05 % cream Apply 1 Application topically 2 (two) times daily.   diclofenac Sodium (VOLTAREN) 1 % GEL Apply 4 g topically 4 (four) times daily.   FLUoxetine (PROZAC) 20 MG capsule TAKE (1) CAPSULE BY MOUTH EVERY DAY   fluticasone (FLONASE) 50 MCG/ACT nasal spray PLACE 2 SPRAYS INTO BOTH NOSTRILS DAILY   methocarbamol (ROBAXIN) 500 MG tablet Take 500 mg by mouth 4 (four) times daily.   Multiple Vitamins-Minerals (CENTRUM SILVER PO) Take by mouth.   naproxen (NAPROSYN) 500 MG tablet Take 500 mg by mouth 2 (two) times daily with a meal.   Probiotic Product (CULTURELLE PROBIOTICS PO) Take by mouth.   [DISCONTINUED] hydrochlorothiazide (HYDRODIURIL) 25 MG tablet TAKE (1) TABLET BY MOUTH EVERY DAY   [DISCONTINUED] levothyroxine (SYNTHROID) 100 MCG tablet Take 1 tablet (100 mcg total) by mouth daily.   [DISCONTINUED] lisinopril (ZESTRIL) 20 MG tablet TAKE (1) TABLET BY MOUTH EVERY DAY   [DISCONTINUED] Magnesium Gluconate 550  MG TABS Take by mouth.       07/12/2022    1:23 PM 02/08/2022   10:54 AM 01/19/2022   11:16 AM 01/22/2021   10:52 AM  GAD 7 : Generalized Anxiety Score  Nervous, Anxious, on Edge 0 0 1 0  Control/stop worrying 0 0 0 0  Worry too much - different things 0 1 0 0  Trouble relaxing 0 0 0 0  Restless 0 0 0 0  Easily annoyed or irritable 0 1 1 0  Afraid - awful might happen 0 0 0 0  Total GAD 7 Score 0 2 2 0  Anxiety Difficulty Not difficult at all Somewhat difficult Not difficult at all        07/12/2022    1:23 PM 04/06/2022    3:13 PM 02/08/2022   10:54 AM  Depression screen PHQ 2/9  Decreased Interest 1 0 0  Down, Depressed, Hopeless 2 0 1  PHQ - 2 Score 3 0 1  Altered sleeping 1 0 1  Tired, decreased energy _0 Change in appetite 0 0 0  Feeling bad or failure about yourself  0 0 0  Trouble concentrating 0 1 1  Moving slowly or fidgety/restless 1 0 0  Suicidal thoughts 0 0 0  PHQ-9 Score _1 Difficult doing work/chores Somewhat difficult Not difficult at all Not difficult at all    BP Readings from Last 3 Encounters:  07/12/22 (!) 150/68  02/08/22 (!) 166/80  01/19/22 132/82    Physical Exam Vitals and nursing note reviewed.  Constitutional:      General: She is not in acute distress.    Appearance: She is well-developed.  HENT:     Head: Normocephalic and atraumatic.  Cardiovascular:     Rate and Rhythm: Normal rate and regular rhythm.  Pulmonary:     Effort: Pulmonary effort is normal. No respiratory distress.     Breath sounds: No wheezing or rhonchi.  Musculoskeletal:        General: Swelling and tenderness present.     Cervical back: Normal range of motion.     Right lower leg: No edema.     Left lower leg: No edema.  Skin:    General: Skin is warm and dry.     Findings: No rash.  Neurological:     General: No focal deficit present.     Mental Status: She is alert and oriented to person, place, and time.     Gait: Gait abnormal (uses cane).   Psychiatric:        Mood and Affect: Mood normal.        Behavior: Behavior normal.     Wt Readings from Last 3 Encounters:  07/12/22 223 lb (101.2 kg)  04/06/22 224 lb (101.6 kg)  02/08/22 224 lb (101.6 kg)    BP (!) 150/68   Pulse 89   Ht _2  (1.549 m)   Wt 223 lb (101.2 kg)   SpO2 95%   BMI 42.14 kg/m   Assessment and Plan: Problem List Items Addressed This Visit       Cardiovascular and Mediastinum   Essential hypertension - Primary (Chronic)    Clinically stable exam with well controlled BP on Lisinopril and HCTZ Tolerating medications without side effects at this time. Pt to continue current regimen and low sodium diet;        Relevant Medications   lisinopril (ZESTRIL) 20 MG tablet   hydrochlorothiazide (HYDRODIURIL) 25 MG tablet   Other Relevant Orders   Basic metabolic panel     Endocrine   Hypothyroidism due to Hashimoto's thyroiditis (Chronic)    Was previously on compounded T3 and T4 In 2023 changed over to Levothyroxine 100 mcg      Relevant Medications   levothyroxine (SYNTHROID) 100 MCG tablet   Other Relevant Orders   TSH+T4F+T3Free     Musculoskeletal and Integument   Degenerative arthritis of knee    Worsening pain of left knee S/p injections Currently taking Naproxen daily Seeing Ortho soon      Relevant Medications   naproxen (NAPROSYN) 500 MG tablet   methocarbamol (ROBAXIN) 500 MG tablet     Other   BMI 40.0-44.9, adult (HCC)   Recurrent major depression in partial remission (HCC) (Chronic)    Clinically stable on current  regimen with failrlygood control of symptoms, No SI or HI. This year has been stressful but she is coping well. Will continue current therapy.       Other Visit Diagnoses     Vitamin D deficiency       she stopped supplements several months ago Wants to see if she is really low   Relevant Orders   VITAMIN D 25 Hydroxy (Vit-D Deficiency, Fractures)        Partially dictated using Radio producer. Any errors are unintentional.  Halina Maidens, MD [facility]  07/12/2022

## 2022-07-12 NOTE — Assessment & Plan Note (Signed)
Clinically stable on current regimen with failrlygood control of symptoms, No SI or HI. This year has been stressful but she is coping well. Will continue current therapy.

## 2022-07-12 NOTE — Assessment & Plan Note (Addendum)
Was previously on compounded T3 and T4 In 2023 changed over to Levothyroxine 100 mcg

## 2022-07-12 NOTE — Assessment & Plan Note (Signed)
Clinically stable exam with well controlled BP on Lisinopril and HCTZ Tolerating medications without side effects at this time. Pt to continue current regimen and low sodium diet;

## 2022-07-12 NOTE — Assessment & Plan Note (Signed)
Worsening pain of left knee S/p injections Currently taking Naproxen daily Seeing Ortho soon

## 2022-07-13 DIAGNOSIS — M1712 Unilateral primary osteoarthritis, left knee: Secondary | ICD-10-CM | POA: Diagnosis not present

## 2022-07-19 ENCOUNTER — Ambulatory Visit
Admission: RE | Admit: 2022-07-19 | Discharge: 2022-07-19 | Disposition: A | Discharge status: Home / Self Care | Payer: Medicare HMO | Source: Ambulatory Visit | Attending: Internal Medicine | Admitting: Internal Medicine

## 2022-07-19 DIAGNOSIS — E038 Other specified hypothyroidism: Secondary | ICD-10-CM | POA: Diagnosis not present

## 2022-07-19 DIAGNOSIS — Z1231 Encounter for screening mammogram for malignant neoplasm of breast: Secondary | ICD-10-CM | POA: Insufficient documentation

## 2022-07-19 DIAGNOSIS — E063 Autoimmune thyroiditis: Secondary | ICD-10-CM | POA: Diagnosis not present

## 2022-07-19 DIAGNOSIS — I10 Essential (primary) hypertension: Secondary | ICD-10-CM | POA: Diagnosis not present

## 2022-07-19 DIAGNOSIS — E559 Vitamin D deficiency, unspecified: Secondary | ICD-10-CM | POA: Diagnosis not present

## 2022-07-20 LAB — TSH+T4F+T3FREE
Free T4: 1.58 ng/dL (ref 0.82–1.77)
T3, Free: 2.2 pg/mL (ref 2.0–4.4)
TSH: 0.496 u[IU]/mL (ref 0.450–4.500)

## 2022-07-20 LAB — BASIC METABOLIC PANEL
BUN/Creatinine Ratio: 24 (ref 12–28)
BUN: 21 mg/dL (ref 8–27)
CO2: 25 mmol/L (ref 20–29)
Calcium: 9.5 mg/dL (ref 8.7–10.3)
Chloride: 103 mmol/L (ref 96–106)
Creatinine, Ser: 0.88 mg/dL (ref 0.57–1.00)
Glucose: 92 mg/dL (ref 70–99)
Potassium: 4.4 mmol/L (ref 3.5–5.2)
Sodium: 143 mmol/L (ref 134–144)
eGFR: 70 mL/min/{1.73_m2} (ref >59)

## 2022-07-20 LAB — VITAMIN D 25 HYDROXY (VIT D DEFICIENCY, FRACTURES): Vit D, 25-Hydroxy: 34.7 ng/mL (ref 30.0–100.0)

## 2022-07-28 ENCOUNTER — Other Ambulatory Visit: Payer: Self-pay | Admitting: Surgery

## 2022-08-04 ENCOUNTER — Ambulatory Visit: Payer: Medicare HMO | Admitting: Nurse Practitioner

## 2022-08-07 IMAGING — MG MM DIGITAL SCREENING BILAT W/ TOMO AND CAD
8 of 14 series · 8 of 40 positions shown · non-contrast
Comparison: Previous exam(s).

ACR Breast Density Category a: The breast tissue is almost entirely
fatty.

CLINICAL DATA: Screening.

EXAM:
DIGITAL SCREENING BILATERAL MAMMOGRAM WITH TOMOSYNTHESIS AND CAD
TECHNIQUE: Bilateral screening digital craniocaudal and mediolateral oblique
mammograms were obtained. Bilateral screening digital breast
tomosynthesis was performed. The images were evaluated with
computer-aided detection.

[L MLO synth-2D (1 of 2)]
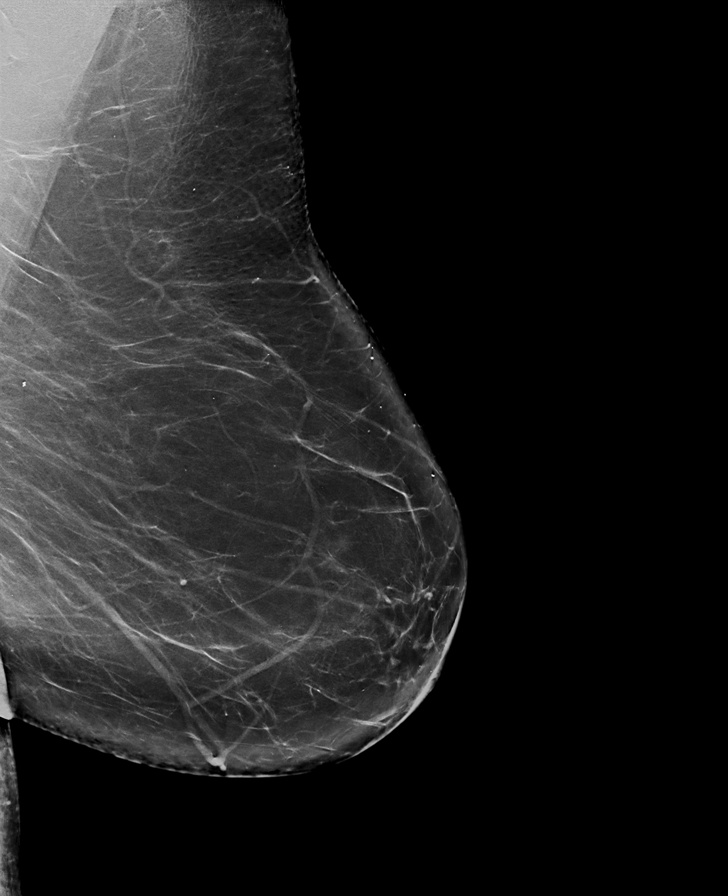

[R CC synth-2D (1 of 2)]
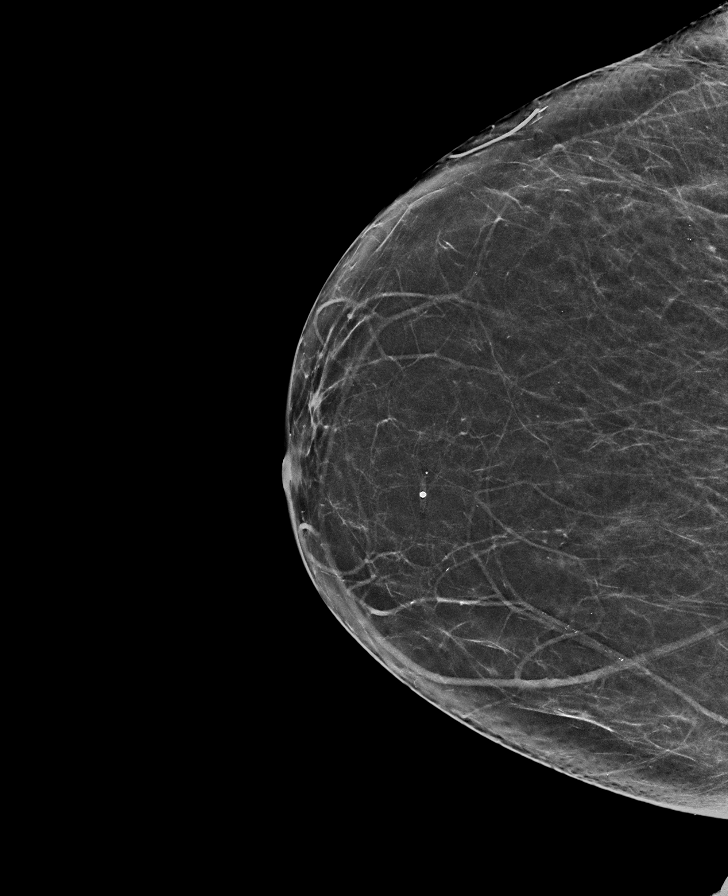

[R CC synth-2D (2 of 2)]
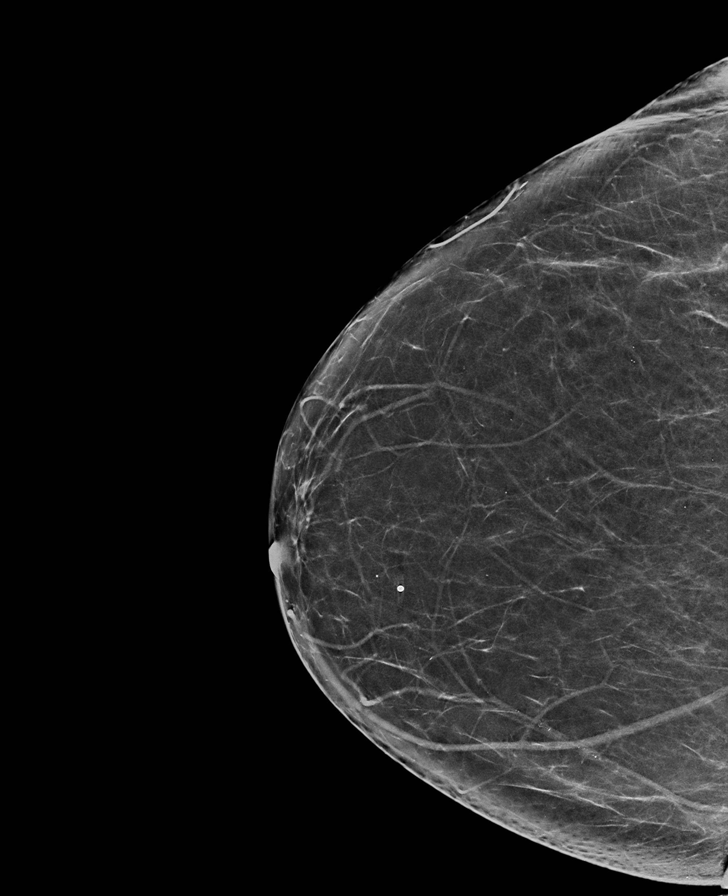

[R MLO synth-2D (1 of 2)]
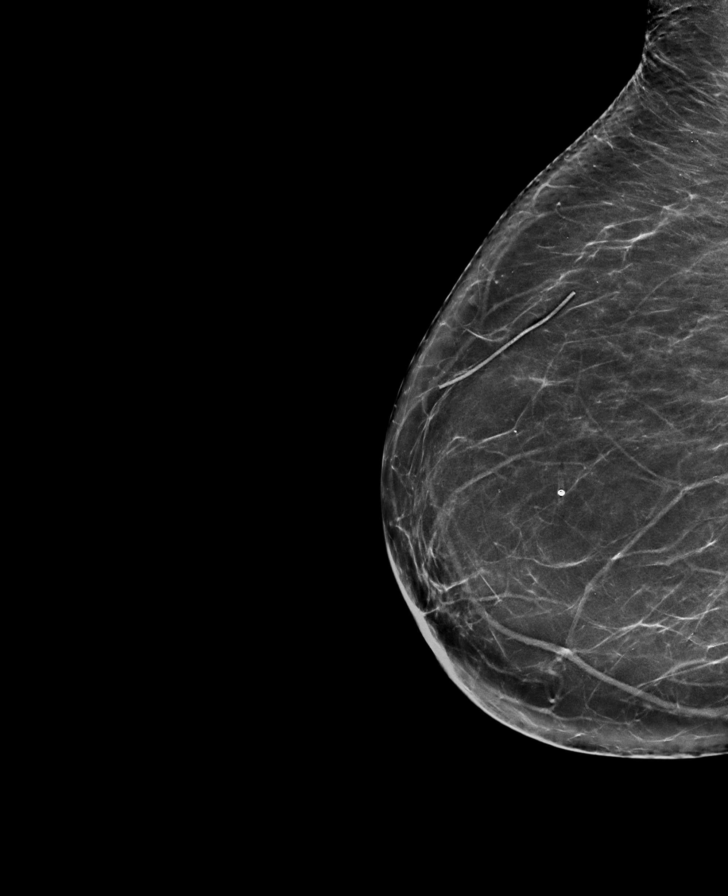

[L CC synth-2D]
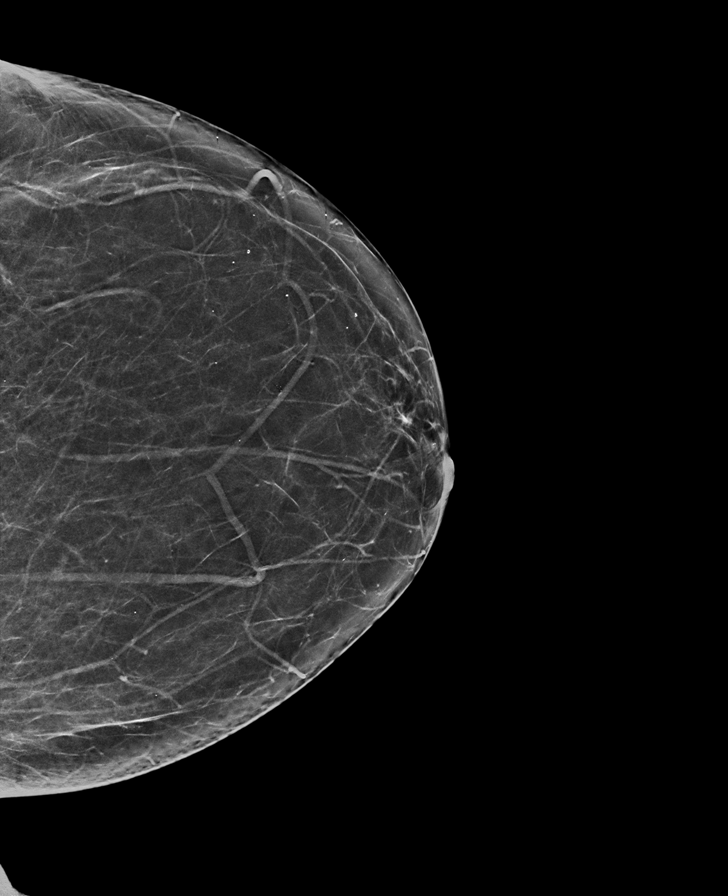

[R MLO synth-2D (2 of 2)]
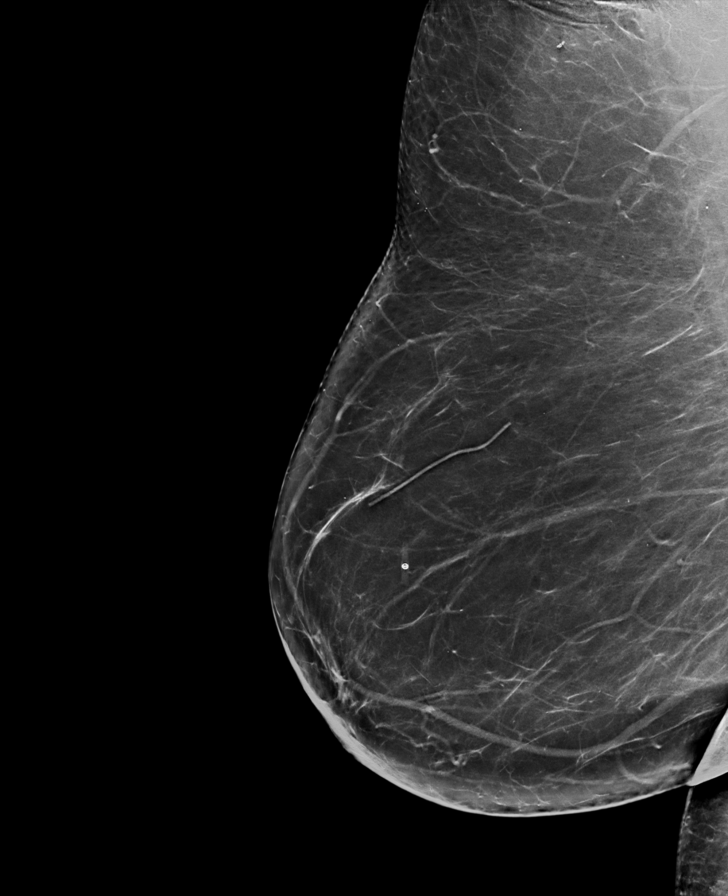

[L MLO synth-2D (2 of 2)]
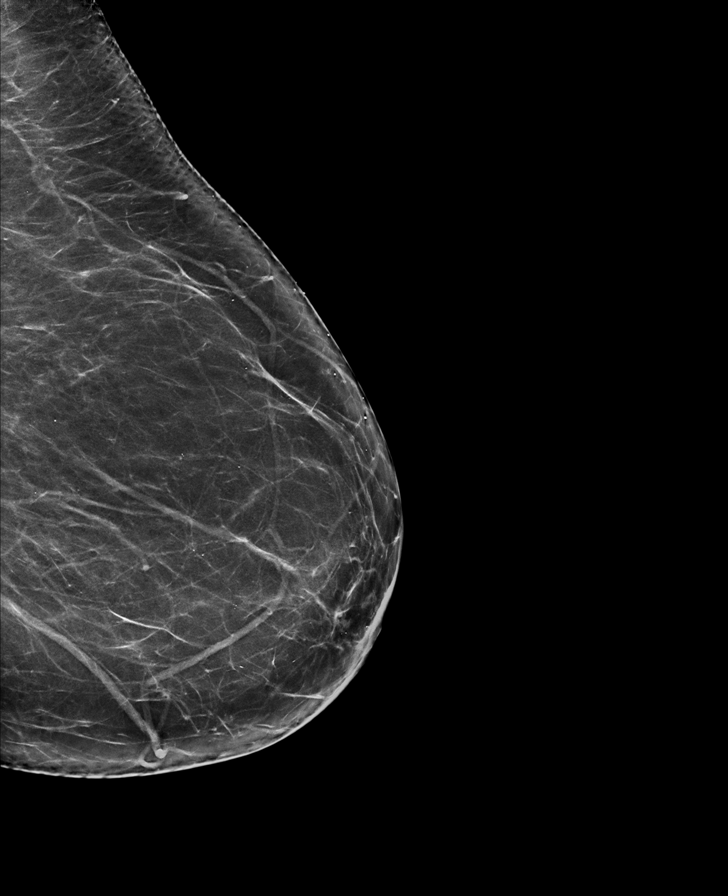

[L MLO tomo · tomo slice 39/77.0]
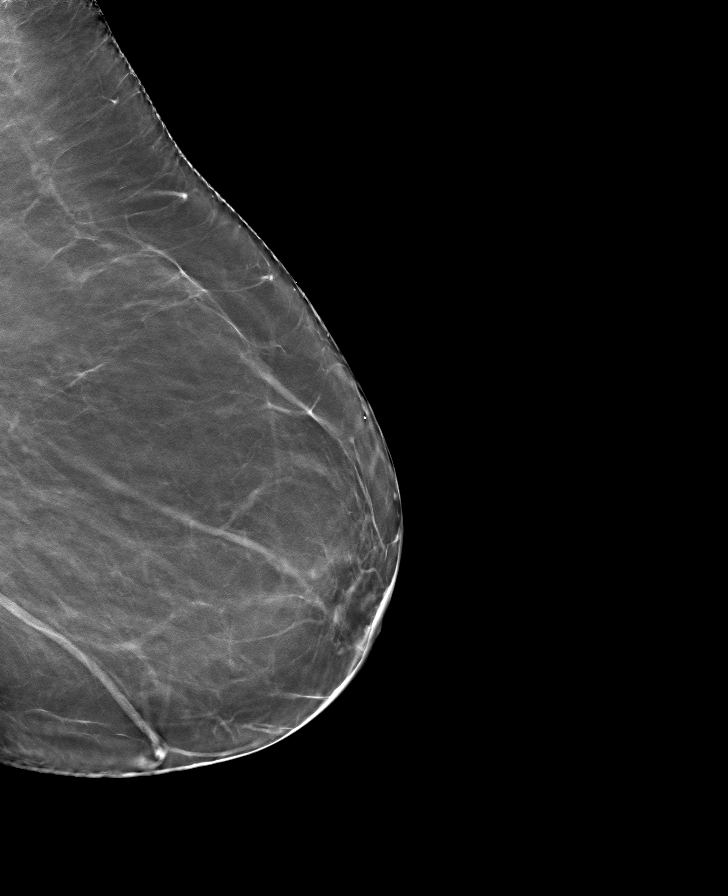

[8 of 40 positions shown; findings below may reference images not displayed]

FINDINGS: There are no findings suspicious for malignancy.
IMPRESSION: No mammographic evidence of malignancy. A result letter of this
screening mammogram will be mailed directly to the patient.

RECOMMENDATION:
Screening mammogram in one year. (Code:0E-3-N98)

BI-RADS CATEGORY  1: Negative.

## 2022-08-08 ENCOUNTER — Other Ambulatory Visit: Payer: Self-pay

## 2022-08-08 ENCOUNTER — Encounter
Admission: RE | Admit: 2022-08-08 | Discharge: 2022-08-08 | Disposition: A | Discharge status: Home / Self Care | Payer: Medicare HMO | Source: Ambulatory Visit | Attending: Surgery | Admitting: Surgery

## 2022-08-08 VITALS — BP 176/73 | HR 81 | Resp 18 | Ht 61.0 in | Wt 222.5 lb

## 2022-08-08 DIAGNOSIS — Z01818 Encounter for other preprocedural examination: Secondary | ICD-10-CM | POA: Insufficient documentation

## 2022-08-08 DIAGNOSIS — Z22322 Carrier or suspected carrier of Methicillin resistant Staphylococcus aureus: Secondary | ICD-10-CM

## 2022-08-08 DIAGNOSIS — Z01810 Encounter for preprocedural cardiovascular examination: Secondary | ICD-10-CM | POA: Diagnosis not present

## 2022-08-08 DIAGNOSIS — Z01812 Encounter for preprocedural laboratory examination: Secondary | ICD-10-CM

## 2022-08-08 HISTORY — DX: Carrier or suspected carrier of methicillin resistant Staphylococcus aureus: Z22.322

## 2022-08-08 LAB — CBC WITH DIFFERENTIAL/PLATELET
Abs Immature Granulocytes: 0.01 10*3/uL (ref 0.00–0.07)
Basophils Absolute: 0 10*3/uL (ref 0.0–0.1)
Basophils Relative: 1 %
Eosinophils Absolute: 0.3 10*3/uL (ref 0.0–0.5)
Eosinophils Relative: 5 %
HCT: 43.6 % (ref 36.0–46.0)
Hemoglobin: 13.8 g/dL (ref 12.0–15.0)
Immature Granulocytes: 0 %
Lymphocytes Relative: 31 %
Lymphs Abs: 1.9 10*3/uL (ref 0.7–4.0)
MCH: 27.6 pg (ref 26.0–34.0)
MCHC: 31.7 g/dL (ref 30.0–36.0)
MCV: 87.2 fL (ref 80.0–100.0)
Monocytes Absolute: 0.6 10*3/uL (ref 0.1–1.0)
Monocytes Relative: 9 %
Neutro Abs: 3.4 10*3/uL (ref 1.7–7.7)
Neutrophils Relative %: 54 %
Platelets: 264 10*3/uL (ref 150–400)
RBC: 5 MIL/uL (ref 3.87–5.11)
RDW: 13.7 % (ref 11.5–15.5)
WBC: 6.3 10*3/uL (ref 4.0–10.5)
nRBC: 0 % (ref 0.0–0.2)

## 2022-08-08 LAB — URINALYSIS, ROUTINE W REFLEX MICROSCOPIC
Bilirubin Urine: NEGATIVE
Glucose, UA: NEGATIVE mg/dL
Hgb urine dipstick: NEGATIVE
Ketones, ur: NEGATIVE mg/dL
Leukocytes,Ua: NEGATIVE
Nitrite: NEGATIVE
Protein, ur: NEGATIVE mg/dL
Specific Gravity, Urine: 1.017 (ref 1.005–1.030)
pH: 5 (ref 5.0–8.0)

## 2022-08-08 LAB — SURGICAL PCR SCREEN
MRSA, PCR: POSITIVE — AB
Staphylococcus aureus: POSITIVE — AB

## 2022-08-08 LAB — TYPE AND SCREEN
ABO/RH(D): A POS
Antibody Screen: NEGATIVE

## 2022-08-08 NOTE — Progress Notes (Signed)
  Perioperative Services  Abnormal Lab Notification and Treatment Plan of Care  Date: 08/08/22  Name: Deanna Johnson MRN:   [id number]  Re: Abnormal labs noted during PAT appointment  Provider Notified: Corky Mull, MD Notification mode: Routed and/or faxed via [place] of concern: Lab Results  Component Value Date   STAPHAUREUS POSITIVE (A) 08/08/2022   MRSAPCR POSITIVE (A) 08/08/2022    Notes: Patient is scheduled for a TOTAL KNEE ARTHROPLASTY (Left: Knee) on 08/18/2022. She is scheduled to receive CEFAZOLIN pre-operatively. Surgical PCR (+) for MRSA; see above.  PLANS:  Review renal function. Estimated Creatinine Clearance: 62.9 mL/min (by C-G formula based on SCr of 0.88 mg/dL). Review allergies. No documented allergy to vancomycin. Order added for VANCOMYCIN 1 GRAM IV to current preoperative prophylactic regimen.  Patient with orders for both CEFAZOLIN + VANCOMYCIN to be given in the setting of documented MRSA (+) surgical PCR.   Guidelines suggest that a beta-lactam antibiotic (first or second generation cephalosporin) should be added for activity against gram-negative organisms.  Vancomycin appears to be less effective than cefazolin for preventing SSIs caused by MSSA. For this reason, the use of vancomycin in combination with cefazolin is favored for prevention of SSI due to MRSA and coagulase-negative staphylococci.  Honor Loh, MSN, APRN, FNP-C, CEN [facility]  Peri-operative Services Nurse Practitioner Phone: [phone] 08/08/22 1:33 PM

## 2022-08-08 NOTE — Patient Instructions (Signed)
Your procedure is scheduled on: Thursday August 18, 2022. Report to Day Surgery inside [facility] 2nd floor, stop by registration desk before getting on elevator. To find out your arrival time please call [phone] between 1PM - 3PM on Wednesday August 17, 2022.  Remember: Instructions that are not followed completely may result in serious medical risk,  up to and including death, or upon the discretion of your surgeon and anesthesiologist your  surgery may need to be rescheduled.     _X__ 1. Do not eat food after midnight the night before your procedure.                 No chewing gum or hard candies. You may drink clear liquids up to 2 hours                 before you are scheduled to arrive for your surgery- DO not drink clear                 liquids within 2 hours of the start of your surgery.                 Clear Liquids include:  water, apple juice without pulp, clear Gatorade, G2 or                  Gatorade Zero (avoid Red/Purple/Blue), Black Coffee or Tea (Do not add                 anything to coffee or tea).  __X__2.   Complete the "Ensure Clear Pre-surgery Clear Carbohydrate Drink" provided to you, 2 hours before arrival. **If you are diabetic you will be provided with an alternative drink, Gatorade Zero or G2.  __X__3.  On the morning of surgery brush your teeth with toothpaste and water, you                may rinse your mouth with mouthwash if you wish.  Do not swallow any toothpaste of mouthwash.     _X__ 4.  No Alcohol for 24 hours before or after surgery.   _X__ 5.  Do Not Smoke or use e-cigarettes For 24 Hours Prior to Your Surgery.                 Do not use any chewable tobacco products for at least 6 hours prior to                 Surgery.  _X__  6.  Do not use any recreational drugs (marijuana, cocaine, heroin, ecstasy, MDMA or other)                For at least one week prior to your surgery.  Combination of these drugs with  anesthesia                May have life threatening results.  ____  7.  Bring all medications with you on the day of surgery if instructed.   __X__ 8.  Notify your doctor if there is any change in your medical condition      (cold, fever, infections).     Do not wear jewelry, make-up, hairpins, clips or nail polish. Do not wear lotions, powders, or perfumes. You may wear deodorant. Do not shave 48 hours prior to surgery. Men may shave face and neck. Do not bring valuables to the hospital.    [facility] is not responsible for any belongings or valuables.  Contacts,  dentures or bridgework may not be worn into surgery. Leave your suitcase in the car. After surgery it may be brought to your room. For patients admitted to the hospital, discharge time is determined by your treatment team.   Patients discharged the day of surgery will not be allowed to drive home.   Make arrangements for someone to be with you for the first 24 hours of your Same Day Discharge.   __X__ Take these medicines the morning of surgery with A SIP OF WATER:    1. FLUoxetine (PROZAC) 20 MG   2. levothyroxine (SYNTHROID) 100 MCG   3.   4.  5.  6.  ____ Fleet Enema (as directed)   __X__ Use CHG Soap (or wipes) as directed  ____ Use Benzoyl Peroxide Gel as instructed  __X__ Use inhalers on the day of surgery  fluticasone (FLONASE) 50 MCG/ACT nasal spray   ____ Stop metformin 2 days prior to surgery    ____ Take 1/2 of usual insulin dose the night before surgery. No insulin the morning          of surgery.   ____ Call your PCP, cardiologist, or Pulmonologist if taking Coumadin/Plavix/aspirin and ask when to stop before your surgery.   __X__ One Week prior to surgery- Stop Anti-inflammatories such as Ibuprofen, Aleve, Advil, Motrin, meloxicam (MOBIC), diclofenac, etodolac, ketorolac, Toradol, Daypro, piroxicam, Goody's or BC powders. OK TO USE TYLENOL IF NEEDED   __X__ One week prior to surgery stop  ALL vitamins and or supplements until after surgery.    ____ Bring C-Pap to the hospital.    If you have any questions regarding your pre-procedure instructions,  Please call Pre-admit Testing at [phone]      Preparing for Surgery with CHLORHEXIDINE GLUCONATE (CHG) Soap  Chlorhexidine Gluconate (CHG) Soap  o An antiseptic cleaner that kills germs and bonds with the skin to continue killing germs even after washing  o Used for showering the night before surgery and morning of surgery  Before surgery, you can play an important role by reducing the number of germs on your skin.  CHG (Chlorhexidine gluconate) soap is an antiseptic cleanser which kills germs and bonds with the skin to continue killing germs even after washing.  Please do not use if you have an allergy to CHG or antibacterial soaps. If your skin becomes reddened/irritated stop using the CHG.  1. Shower the NIGHT BEFORE SURGERY and the MORNING OF SURGERY with CHG soap.  2. If you choose to wash your hair, wash your hair first as usual with your normal shampoo.  3. After shampooing, rinse your hair and body thoroughly to remove the shampoo.  4. Use CHG as you would any other liquid soap. You can apply CHG directly to the skin and wash gently with a scrungie or a clean washcloth.  5. Apply the CHG soap to your body only from the neck down. Do not use on open wounds or open sores. Avoid contact with your eyes, ears, mouth, and genitals (private parts). Wash face and genitals (private parts) with your normal soap.  6. Wash thoroughly, paying special attention to the area where your surgery will be performed.  7. Thoroughly rinse your body with warm water.  8. Do not shower/wash with your normal soap after using and rinsing off the CHG soap.  9. Pat yourself dry with a clean towel.  10. Wear clean pajamas to bed the night before surgery.  12. Place clean sheets on your bed  the night of your first shower and do  not sleep with pets.  13. Shower again with the CHG soap on the day of surgery prior to arriving at the hospital.  14. Do not apply any deodorants/lotions/powders.  15. Please wear clean clothes to the hospital.

## 2022-08-10 DIAGNOSIS — Z6841 Body Mass Index (BMI) 40.0 and over, adult: Secondary | ICD-10-CM | POA: Diagnosis not present

## 2022-08-10 DIAGNOSIS — M1712 Unilateral primary osteoarthritis, left knee: Secondary | ICD-10-CM | POA: Diagnosis not present

## 2022-08-18 ENCOUNTER — Observation Stay
Admission: RE | Admit: 2022-08-18 | Discharge: 2022-08-19 | Disposition: A | Discharge status: Home Health | Payer: Medicare HMO | Attending: Surgery | Admitting: Surgery

## 2022-08-18 ENCOUNTER — Encounter: Payer: Self-pay | Admitting: Surgery

## 2022-08-18 ENCOUNTER — Encounter
Admission: RE | Disposition: A | Discharge status: Home Health | Payer: Self-pay | Source: Home / Self Care | Attending: Surgery

## 2022-08-18 ENCOUNTER — Ambulatory Visit: Payer: Medicare HMO | Admitting: Urgent Care

## 2022-08-18 ENCOUNTER — Ambulatory Visit: Payer: Medicare HMO | Admitting: Certified Registered"

## 2022-08-18 ENCOUNTER — Other Ambulatory Visit: Payer: Self-pay

## 2022-08-18 ENCOUNTER — Observation Stay: Payer: Medicare HMO

## 2022-08-18 DIAGNOSIS — Z6841 Body Mass Index (BMI) 40.0 and over, adult: Secondary | ICD-10-CM | POA: Diagnosis not present

## 2022-08-18 DIAGNOSIS — Z87891 Personal history of nicotine dependence: Secondary | ICD-10-CM | POA: Diagnosis not present

## 2022-08-18 DIAGNOSIS — I10 Essential (primary) hypertension: Secondary | ICD-10-CM | POA: Insufficient documentation

## 2022-08-18 DIAGNOSIS — Z79899 Other long term (current) drug therapy: Secondary | ICD-10-CM | POA: Insufficient documentation

## 2022-08-18 DIAGNOSIS — Z471 Aftercare following joint replacement surgery: Secondary | ICD-10-CM | POA: Diagnosis not present

## 2022-08-18 DIAGNOSIS — E039 Hypothyroidism, unspecified: Secondary | ICD-10-CM | POA: Insufficient documentation

## 2022-08-18 DIAGNOSIS — M1712 Unilateral primary osteoarthritis, left knee: Principal | ICD-10-CM | POA: Insufficient documentation

## 2022-08-18 DIAGNOSIS — Z96652 Presence of left artificial knee joint: Secondary | ICD-10-CM | POA: Diagnosis not present

## 2022-08-18 HISTORY — PX: TOTAL KNEE ARTHROPLASTY: SHX125

## 2022-08-18 LAB — ABO/RH: ABO/RH(D): A POS

## 2022-08-18 SURGERY — ARTHROPLASTY, KNEE, TOTAL
Anesthesia: Spinal | Site: Knee | Laterality: Left

## 2022-08-18 MED ORDER — APIXABAN 2.5 MG PO TABS: 2.5000 mg | ORAL_TABLET | Freq: Two times a day (BID) | ORAL | Status: DC

## 2022-08-18 MED ORDER — ACETAMINOPHEN 500 MG PO TABS
ORAL_TABLET | ORAL | Status: AC
  Administered 2022-08-18: 1000 mg via ORAL
  Filled 2022-08-18: qty 2

## 2022-08-18 MED ORDER — ONDANSETRON HCL 4 MG/2ML IJ SOLN: 4.0000 mg | Freq: Four times a day (QID) | INTRAMUSCULAR | Status: DC | PRN

## 2022-08-18 MED ORDER — MAGNESIUM HYDROXIDE 400 MG/5ML PO SUSP: 30.0000 mL | Freq: Every day | ORAL | Status: DC | PRN

## 2022-08-18 MED ORDER — BISACODYL 10 MG RE SUPP: 10.0000 mg | Freq: Every day | RECTAL | Status: DC | PRN

## 2022-08-18 MED ORDER — VANCOMYCIN HCL IN DEXTROSE 1-5 GM/200ML-% IV SOLN
INTRAVENOUS | Status: AC
  Administered 2022-08-18: 1000 mg via INTRAVENOUS
  Filled 2022-08-18: qty 200

## 2022-08-18 MED ORDER — OXYCODONE HCL 5 MG PO TABS
5.0000 mg | ORAL_TABLET | ORAL | Status: DC | PRN
  Administered 2022-08-18 (×2): 10 mg via ORAL

## 2022-08-18 MED ORDER — LISINOPRIL 20 MG PO TABS: 20.0000 mg | ORAL_TABLET | Freq: Every day | ORAL | Status: DC

## 2022-08-18 MED ORDER — FLEET ENEMA 7-19 GM/118ML RE ENEM: 1.0000 | ENEMA | Freq: Once | RECTAL | Status: DC | PRN

## 2022-08-18 MED ORDER — VANCOMYCIN HCL IN DEXTROSE 1-5 GM/200ML-% IV SOLN: 1000.0000 mg | Freq: Two times a day (BID) | INTRAVENOUS | Status: AC

## 2022-08-18 MED ORDER — METHOCARBAMOL 500 MG PO TABS
ORAL_TABLET | ORAL | Status: AC
  Filled 2022-08-18: qty 1

## 2022-08-18 MED ORDER — FENTANYL CITRATE (PF) 100 MCG/2ML IJ SOLN
INTRAMUSCULAR | Status: DC | PRN
  Administered 2022-08-18 (×2): 25 ug via INTRAVENOUS
  Administered 2022-08-18: 50 ug via INTRAVENOUS

## 2022-08-18 MED ORDER — METOCLOPRAMIDE HCL 5 MG/ML IJ SOLN: 5.0000 mg | Freq: Three times a day (TID) | INTRAMUSCULAR | Status: DC | PRN

## 2022-08-18 MED ORDER — METHOCARBAMOL 500 MG PO TABS
500.0000 mg | ORAL_TABLET | Freq: Four times a day (QID) | ORAL | Status: DC
  Administered 2022-08-18 (×2): 500 mg via ORAL

## 2022-08-18 MED ORDER — MIDAZOLAM HCL 2 MG/2ML IJ SOLN
INTRAMUSCULAR | Status: AC
  Filled 2022-08-18: qty 2

## 2022-08-18 MED ORDER — SODIUM CHLORIDE 0.9 % IV SOLN: INTRAVENOUS | Status: DC

## 2022-08-18 MED ORDER — ADULT MULTIVITAMIN W/MINERALS CH: 1.0000 | ORAL_TABLET | Freq: Every day | ORAL | Status: DC

## 2022-08-18 MED ORDER — BUPIVACAINE HCL (PF) 0.5 % IJ SOLN
INTRAMUSCULAR | Status: AC
  Filled 2022-08-18: qty 10

## 2022-08-18 MED ORDER — KETOROLAC TROMETHAMINE 15 MG/ML IJ SOLN: 15.0000 mg | Freq: Once | INTRAMUSCULAR | Status: AC

## 2022-08-18 MED ORDER — KETOROLAC TROMETHAMINE 15 MG/ML IJ SOLN: 7.5000 mg | Freq: Four times a day (QID) | INTRAMUSCULAR | Status: DC

## 2022-08-18 MED ORDER — OXYCODONE HCL 5 MG PO TABS
ORAL_TABLET | ORAL | Status: AC
  Filled 2022-08-18: qty 2

## 2022-08-18 MED ORDER — OXYCODONE HCL 5 MG PO TABS
5.0000 mg | ORAL_TABLET | Freq: Once | ORAL | Status: AC | PRN
  Administered 2022-08-18: 5 mg via ORAL

## 2022-08-18 MED ORDER — ONDANSETRON HCL 4 MG PO TABS: 4.0000 mg | ORAL_TABLET | Freq: Four times a day (QID) | ORAL | Status: DC | PRN

## 2022-08-18 MED ORDER — FAMOTIDINE 20 MG PO TABS: 20.0000 mg | ORAL_TABLET | Freq: Once | ORAL | Status: AC

## 2022-08-18 MED ORDER — TRIAMCINOLONE ACETONIDE 40 MG/ML IJ SUSP
INTRAMUSCULAR | Status: AC
  Filled 2022-08-18: qty 2

## 2022-08-18 MED ORDER — DOCUSATE SODIUM 100 MG PO CAPS
ORAL_CAPSULE | ORAL | Status: AC
  Administered 2022-08-18: 100 mg
  Filled 2022-08-18: qty 1

## 2022-08-18 MED ORDER — DOCUSATE SODIUM 100 MG PO CAPS
ORAL_CAPSULE | ORAL | Status: AC
  Administered 2022-08-18: 100 mg via ORAL
  Filled 2022-08-18: qty 1

## 2022-08-18 MED ORDER — CEFAZOLIN SODIUM-DEXTROSE 2-4 GM/100ML-% IV SOLN
INTRAVENOUS | Status: AC
  Filled 2022-08-18: qty 100

## 2022-08-18 MED ORDER — BUPIVACAINE-EPINEPHRINE (PF) 0.5% -1:200000 IJ SOLN
INTRAMUSCULAR | Status: AC
  Filled 2022-08-18: qty 30

## 2022-08-18 MED ORDER — FLUOXETINE HCL 20 MG PO CAPS
ORAL_CAPSULE | ORAL | Status: AC
  Administered 2022-08-18: 20 mg via ORAL
  Filled 2022-08-18: qty 1

## 2022-08-18 MED ORDER — HYDROCHLOROTHIAZIDE 25 MG PO TABS: 25.0000 mg | ORAL_TABLET | Freq: Every day | ORAL | Status: DC

## 2022-08-18 MED ORDER — ADULT MULTIVITAMIN W/MINERALS CH
ORAL_TABLET | ORAL | Status: AC
  Administered 2022-08-18: 1 via ORAL
  Filled 2022-08-18: qty 1

## 2022-08-18 MED ORDER — TRIAMCINOLONE ACETONIDE 40 MG/ML IJ SUSP
INTRAMUSCULAR | Status: DC | PRN
  Administered 2022-08-18: 87 mL via INTRAMUSCULAR

## 2022-08-18 MED ORDER — LEVOTHYROXINE SODIUM 100 MCG PO TABS
100.0000 ug | ORAL_TABLET | Freq: Every day | ORAL | Status: DC
  Administered 2022-08-19: 100 ug via ORAL
  Filled 2022-08-18: qty 1

## 2022-08-18 MED ORDER — 0.9 % SODIUM CHLORIDE (POUR BTL) OPTIME
TOPICAL | Status: DC | PRN
  Administered 2022-08-18: 500 mL

## 2022-08-18 MED ORDER — PROPOFOL 1000 MG/100ML IV EMUL
INTRAVENOUS | Status: AC
  Filled 2022-08-18: qty 100

## 2022-08-18 MED ORDER — BUPIVACAINE HCL (PF) 0.5 % IJ SOLN
INTRAMUSCULAR | Status: DC | PRN
  Administered 2022-08-18: 3 mL via INTRATHECAL

## 2022-08-18 MED ORDER — DIPHENHYDRAMINE HCL 12.5 MG/5ML PO ELIX: 12.5000 mg | ORAL_SOLUTION | ORAL | Status: DC | PRN

## 2022-08-18 MED ORDER — METHOCARBAMOL 500 MG PO TABS
ORAL_TABLET | ORAL | Status: AC
  Filled 2022-08-18: qty 2

## 2022-08-18 MED ORDER — PROPOFOL 10 MG/ML IV BOLUS
INTRAVENOUS | Status: DC | PRN
  Administered 2022-08-18: 200 ug/kg/min via INTRAVENOUS

## 2022-08-18 MED ORDER — TRANEXAMIC ACID 1000 MG/10ML IV SOLN
INTRAVENOUS | Status: DC | PRN
  Administered 2022-08-18: 1000 mg via TOPICAL

## 2022-08-18 MED ORDER — HYDROCHLOROTHIAZIDE 25 MG PO TABS
ORAL_TABLET | ORAL | Status: AC
  Administered 2022-08-18: 25 mg via ORAL
  Filled 2022-08-18: qty 1

## 2022-08-18 MED ORDER — OXYCODONE HCL 5 MG/5ML PO SOLN: 5.0000 mg | Freq: Once | ORAL | Status: AC | PRN

## 2022-08-18 MED ORDER — PROPOFOL 10 MG/ML IV BOLUS
INTRAVENOUS | Status: AC
  Filled 2022-08-18: qty 20

## 2022-08-18 MED ORDER — BUPIVACAINE LIPOSOME 1.3 % IJ SUSP
INTRAMUSCULAR | Status: AC
  Filled 2022-08-18: qty 20

## 2022-08-18 MED ORDER — MIDAZOLAM HCL 5 MG/5ML IJ SOLN
INTRAMUSCULAR | Status: DC | PRN
  Administered 2022-08-18 (×2): 1 mg via INTRAVENOUS

## 2022-08-18 MED ORDER — FENTANYL CITRATE (PF) 100 MCG/2ML IJ SOLN: 25.0000 ug | INTRAMUSCULAR | Status: DC | PRN

## 2022-08-18 MED ORDER — ORAL CARE MOUTH RINSE: 15.0000 mL | OROMUCOSAL | Status: DC | PRN

## 2022-08-18 MED ORDER — FLUOXETINE HCL 20 MG PO CAPS: 20.0000 mg | ORAL_CAPSULE | Freq: Every day | ORAL | Status: DC

## 2022-08-18 MED ORDER — METOCLOPRAMIDE HCL 10 MG PO TABS: 5.0000 mg | ORAL_TABLET | Freq: Three times a day (TID) | ORAL | Status: DC | PRN

## 2022-08-18 MED ORDER — SODIUM CHLORIDE (PF) 0.9 % IJ SOLN
INTRAMUSCULAR | Status: AC
  Filled 2022-08-18: qty 10

## 2022-08-18 MED ORDER — ACETAMINOPHEN 325 MG PO TABS: 325.0000 mg | ORAL_TABLET | Freq: Four times a day (QID) | ORAL | Status: DC | PRN

## 2022-08-18 MED ORDER — FLUTICASONE PROPIONATE 50 MCG/ACT NA SUSP: 2.0000 | Freq: Every day | NASAL | Status: DC

## 2022-08-18 MED ORDER — KETOROLAC TROMETHAMINE 15 MG/ML IJ SOLN
INTRAMUSCULAR | Status: AC
  Administered 2022-08-18: 7.5 mg via INTRAVENOUS
  Filled 2022-08-18: qty 1

## 2022-08-18 MED ORDER — KETOROLAC TROMETHAMINE 15 MG/ML IJ SOLN
INTRAMUSCULAR | Status: AC
  Administered 2022-08-18: 15 mg via INTRAVENOUS
  Filled 2022-08-18: qty 1

## 2022-08-18 MED ORDER — ACETAMINOPHEN 500 MG PO TABS
1000.0000 mg | ORAL_TABLET | Freq: Four times a day (QID) | ORAL | Status: AC
  Administered 2022-08-19: 1000 mg via ORAL

## 2022-08-18 MED ORDER — HYDROCORTISONE SOD SUC (PF) 100 MG IJ SOLR
INTRAMUSCULAR | Status: AC
  Filled 2022-08-18: qty 2

## 2022-08-18 MED ORDER — TRANEXAMIC ACID 1000 MG/10ML IV SOLN
INTRAVENOUS | Status: AC
  Filled 2022-08-18: qty 10

## 2022-08-18 MED ORDER — ORAL CARE MOUTH RINSE: 15.0000 mL | Freq: Once | OROMUCOSAL | Status: AC

## 2022-08-18 MED ORDER — FAMOTIDINE 20 MG PO TABS
ORAL_TABLET | ORAL | Status: AC
  Administered 2022-08-18: 20 mg via ORAL
  Filled 2022-08-18: qty 1

## 2022-08-18 MED ORDER — LISINOPRIL 20 MG PO TABS
ORAL_TABLET | ORAL | Status: AC
  Administered 2022-08-18: 20 mg via ORAL
  Filled 2022-08-18: qty 1

## 2022-08-18 MED ORDER — CEFAZOLIN SODIUM-DEXTROSE 2-4 GM/100ML-% IV SOLN
2.0000 g | INTRAVENOUS | Status: AC
  Administered 2022-08-18: 2 g via INTRAVENOUS

## 2022-08-18 MED ORDER — DEXMEDETOMIDINE HCL IN NACL 80 MCG/20ML IV SOLN
INTRAVENOUS | Status: AC
  Filled 2022-08-18: qty 40

## 2022-08-18 MED ORDER — SODIUM CHLORIDE (PF) 0.9 % IJ SOLN
INTRAMUSCULAR | Status: AC
  Filled 2022-08-18: qty 50

## 2022-08-18 MED ORDER — DOCUSATE SODIUM 100 MG PO CAPS: 100.0000 mg | ORAL_CAPSULE | Freq: Two times a day (BID) | ORAL | Status: DC

## 2022-08-18 MED ORDER — OXYCODONE HCL 5 MG PO TABS
ORAL_TABLET | ORAL | Status: AC
  Filled 2022-08-18: qty 1

## 2022-08-18 MED ORDER — NOREPINEPHRINE 4 MG/250ML-% IV SOLN
INTRAVENOUS | Status: AC
  Filled 2022-08-18: qty 250

## 2022-08-18 MED ORDER — HYDROMORPHONE HCL 1 MG/ML IJ SOLN: 0.2500 mg | INTRAMUSCULAR | Status: DC | PRN

## 2022-08-18 MED ORDER — PHENYLEPHRINE HCL-NACL 20-0.9 MG/250ML-% IV SOLN
INTRAVENOUS | Status: DC | PRN
  Administered 2022-08-18: 50 ug/min via INTRAVENOUS

## 2022-08-18 MED ORDER — PHENYLEPHRINE HCL-NACL 20-0.9 MG/250ML-% IV SOLN
INTRAVENOUS | Status: AC
  Filled 2022-08-18: qty 250

## 2022-08-18 MED ORDER — METHOCARBAMOL 500 MG PO TABS
ORAL_TABLET | ORAL | Status: AC
  Administered 2022-08-18: 500 mg via ORAL
  Filled 2022-08-18: qty 1

## 2022-08-18 MED ORDER — KETOROLAC TROMETHAMINE 30 MG/ML IJ SOLN
INTRAMUSCULAR | Status: AC
  Filled 2022-08-18: qty 1

## 2022-08-18 MED ORDER — FENTANYL CITRATE (PF) 100 MCG/2ML IJ SOLN
INTRAMUSCULAR | Status: AC
  Filled 2022-08-18: qty 2

## 2022-08-18 MED ORDER — SODIUM CHLORIDE 0.9 % IR SOLN
Status: DC | PRN
  Administered 2022-08-18: 3000 mL

## 2022-08-18 MED ORDER — LACTATED RINGERS IV SOLN: INTRAVENOUS | Status: DC

## 2022-08-18 MED ORDER — CHLORHEXIDINE GLUCONATE 0.12 % MT SOLN: 15.0000 mL | Freq: Once | OROMUCOSAL | Status: AC

## 2022-08-18 MED ORDER — VANCOMYCIN HCL IN DEXTROSE 1-5 GM/200ML-% IV SOLN: 1000.0000 mg | Freq: Once | INTRAVENOUS | Status: AC

## 2022-08-18 MED ORDER — CHLORHEXIDINE GLUCONATE 0.12 % MT SOLN
OROMUCOSAL | Status: AC
  Administered 2022-08-18: 15 mL via OROMUCOSAL
  Filled 2022-08-18: qty 15

## 2022-08-18 SURGICAL SUPPLY — 61 items
BIT DRILL QUICK REL 1/8 2PK SL (DRILL) IMPLANT
BLADE SAW SAG 25X90X1.19 (BLADE) ×1 IMPLANT
BLADE SURG SZ20 CARB STEEL (BLADE) ×1 IMPLANT
BNDG ELASTIC 6X5.8 VLCR NS LF (GAUZE/BANDAGES/DRESSINGS) ×1 IMPLANT
CEMENT BONE R 1X40 (Cement) ×2 IMPLANT
CEMENT VACUUM MIXING SYSTEM (MISCELLANEOUS) ×1 IMPLANT
CHLORAPREP W/TINT 26 (MISCELLANEOUS) ×1 IMPLANT
COMP PAT 3 PEG SERIES A 31/6.2 (Miscellaneous) ×1 IMPLANT
COMPONENT PAT3 PEG SERS 31/6.2 (Miscellaneous) IMPLANT
COOLER POLAR GLACIER W/PUMP (MISCELLANEOUS) ×1 IMPLANT
COVER MAYO STAND REUSABLE (DRAPES) ×1 IMPLANT
CUFF TOURN SGL QUICK 24 (TOURNIQUET CUFF)
CUFF TOURN SGL QUICK 34 (TOURNIQUET CUFF)
CUFF TRNQT CYL 24X4X16.5-23 (TOURNIQUET CUFF) IMPLANT
CUFF TRNQT CYL 34X4.125X (TOURNIQUET CUFF) IMPLANT
DRAPE 3/4 80X56 (DRAPES) ×1 IMPLANT
DRAPE IMP U-DRAPE 54X76 (DRAPES) ×1 IMPLANT
DRAPE U-SHAPE 47X51 STRL (DRAPES) ×1 IMPLANT
DRILL QUICK RELEASE 1/8 INCH (DRILL) ×3
DRSG MEPILEX SACRM 8.7X9.8 (GAUZE/BANDAGES/DRESSINGS) IMPLANT
DRSG OPSITE POSTOP 4X10 (GAUZE/BANDAGES/DRESSINGS) ×1 IMPLANT
DRSG OPSITE POSTOP 4X8 (GAUZE/BANDAGES/DRESSINGS) ×1 IMPLANT
ELECT REM PT RETURN 9FT ADLT (ELECTROSURGICAL) ×1
ELECTRODE REM PT RTRN 9FT ADLT (ELECTROSURGICAL) ×1 IMPLANT
FEMORAL CR LEFT 65MM (Joint) IMPLANT
GAUZE XEROFORM 1X8 LF (GAUZE/BANDAGES/DRESSINGS) ×1 IMPLANT
GLOVE BIO SURGEON STRL SZ7.5 (GLOVE) ×4 IMPLANT
GLOVE BIO SURGEON STRL SZ8 (GLOVE) ×4 IMPLANT
GLOVE BIOGEL PI IND STRL 8 (GLOVE) ×1 IMPLANT
GLOVE SURG UNDER LTX SZ8 (GLOVE) ×1 IMPLANT
GOWN STRL REUS W/ TWL LRG LVL3 (GOWN DISPOSABLE) ×1 IMPLANT
GOWN STRL REUS W/ TWL XL LVL3 (GOWN DISPOSABLE) ×1 IMPLANT
GOWN STRL REUS W/TWL LRG LVL3 (GOWN DISPOSABLE) ×1
GOWN STRL REUS W/TWL XL LVL3 (GOWN DISPOSABLE) ×1
HANDLE YANKAUER SUCT OPEN TIP (MISCELLANEOUS) ×1 IMPLANT
HOOD PEEL AWAY T7 (MISCELLANEOUS) ×3 IMPLANT
INSERT TIB BEARING 67X10 (Insert) IMPLANT
IV NS IRRIG 3000ML ARTHROMATIC (IV SOLUTION) ×1 IMPLANT
KIT TURNOVER KIT A (KITS) ×1 IMPLANT
MANIFOLD NEPTUNE II (INSTRUMENTS) ×1 IMPLANT
NDL SPNL 20GX3.5 QUINCKE YW (NEEDLE) ×1 IMPLANT
NEEDLE SPNL 20GX3.5 QUINCKE YW (NEEDLE) ×1 IMPLANT
NS IRRIG 1000ML POUR BTL (IV SOLUTION) ×1 IMPLANT
PACK TOTAL KNEE (MISCELLANEOUS) ×1 IMPLANT
PAD WRAPON POLAR KNEE (MISCELLANEOUS) ×1 IMPLANT
PENCIL SMOKE EVACUATOR (MISCELLANEOUS) ×1 IMPLANT
PLATE INTERLOK 6700 (Plate) IMPLANT
PULSAVAC PLUS IRRIG FAN TIP (DISPOSABLE) ×1
STAPLER SKIN PROX 35W (STAPLE) ×1 IMPLANT
SUCTION FRAZIER HANDLE 10FR (MISCELLANEOUS) ×1
SUCTION TUBE FRAZIER 10FR DISP (MISCELLANEOUS) ×1 IMPLANT
SUT VIC AB 0 CT1 36 (SUTURE) ×3 IMPLANT
SUT VIC AB 2-0 CT1 27 (SUTURE) ×3
SUT VIC AB 2-0 CT1 TAPERPNT 27 (SUTURE) ×3 IMPLANT
SYR 10ML LL (SYRINGE) ×1 IMPLANT
SYR 20ML LL LF (SYRINGE) ×1 IMPLANT
SYR 30ML LL (SYRINGE) IMPLANT
TIP FAN IRRIG PULSAVAC PLUS (DISPOSABLE) ×1 IMPLANT
TRAP FLUID SMOKE EVACUATOR (MISCELLANEOUS) ×2 IMPLANT
WATER STERILE IRR 500ML POUR (IV SOLUTION) ×1 IMPLANT
WRAPON POLAR PAD KNEE (MISCELLANEOUS) ×1

## 2022-08-18 NOTE — Anesthesia Preprocedure Evaluation (Addendum)
Anesthesia Evaluation  Patient identified by MRN, date of birth, ID band Patient awake    Reviewed: Allergy & Precautions, NPO status , Patient's Chart, lab work & pertinent test results  History of Anesthesia Complications Negative for: history of anesthetic complications  Airway Mallampati: II  TM Distance: >3 FB Neck ROM: full    Dental no notable dental hx.    Pulmonary former smoker   Pulmonary exam normal        Cardiovascular hypertension, On Medications Normal cardiovascular exam+ Valvular Problems/Murmurs      Neuro/Psych  PSYCHIATRIC DISORDERS  Depression     Neuromuscular disease    GI/Hepatic negative GI ROS, Neg liver ROS,,,  Endo/Other  Hypothyroidism    Renal/GU      Musculoskeletal  (+) Arthritis ,  Fibromyalgia -  Abdominal   Peds  Hematology negative hematology ROS (+)   Anesthesia Other Findings Past Medical History: No date: Allergy No date: Arthritis No date: Complete tear of rotator cuff     Comment:  RIGHT No date: Depression No date: Fibromyalgia No date: Heart murmur     Comment:  slight no problems No date: History of tick-borne disease 05/05/2017: Hyperlipidemia, mixed No date: Hypertension No date: Hypothyroidism 08/08/2022: Nose colonized with MRSA     Comment:  a.) PCR (+) prior to LEFT TKA No date: Shoulder pain, left     Comment:  TORN ROTATOR CUFF  Past Surgical History: 02/23/2021: Anterial cervical dissectomy Fusion 1987: BREAST BIOPSY; Right     Comment:  neg No date: BREAST SURGERY     Comment:  RIGHT BREAST BIOPSY No date: BUNIONECTOMY 01/04/2019: CATARACT EXTRACTION W/PHACO; Left     Comment:  Procedure: CATARACT EXTRACTION PHACO AND INTRAOCULAR               LENS PLACEMENT ([place])  LEFT;  Surgeon: Eulogio Bear,              MD;  Location: [facility];  Service:               Ophthalmology;  Laterality: Left; 02/01/2019: CATARACT EXTRACTION  W/PHACO; Right     Comment:  Procedure: CATARACT EXTRACTION PHACO AND INTRAOCULAR               LENS PLACEMENT ([place])  RIGHT;  Surgeon: Eulogio Bear, MD;  Location: [place];  Service:               Ophthalmology;  Laterality: Right; 2011: COLONOSCOPY     Comment:  benign polyps - f/u 2016 07/21/2017: COLONOSCOPY WITH PROPOFOL; N/A     Comment:  Procedure: COLONOSCOPY WITH PROPOFOL;  Surgeon:               Lollie Sails, MD;  Location: [facility] ENDOSCOPY;                Service: Endoscopy;  Laterality: N/A; June & July 2020: EYE SURGERY     Comment:  cataract- both eyes 1981: FRACTURE SURGERY     Comment:  MAXILLARY FACIAL SURGERY No date: MANDIBLE FRACTURE SURGERY     Comment:  jaw resection No date: shoulder arthroscopy; Right 01/30/2015: SHOULDER ARTHROSCOPY WITH OPEN ROTATOR CUFF REPAIR; Left     Comment:  Procedure: SHOULDER ARTHROSCOPY WITH POSS OPEN ROTATOR               CUFF REPAIR;  Surgeon: Leanor Kail,  MD;  Location:               [place];  Service: Orthopedics;  Laterality:               Left;  Release of long head of biceps tendon Subacromial              decompression Mini open rotator cuff repair February 23, 2021: SPINE SURGERY     Comment:  ACDF surgery C4-C7 No date: TONSILLECTOMY No date: TOTAL ABDOMINAL HYSTERECTOMY 1978: TUBAL LIGATION  BMI    Body Mass Index: 42.04 kg/m      Reproductive/Obstetrics negative OB ROS                             Anesthesia Physical Anesthesia Plan  ASA: 3  Anesthesia Plan: Spinal   Post-op Pain Management: Toradol IV (intra-op)* and Ofirmev IV (intra-op)*   Induction: Intravenous  PONV Risk Score and Plan: 3 and TIVA and Propofol infusion  Airway Management Planned: Natural Airway and Nasal Cannula  Additional Equipment:   Intra-op Plan:   Post-operative Plan:   Informed Consent: I have reviewed the patients History and Physical, chart,  labs and discussed the procedure including the risks, benefits and alternatives for the proposed anesthesia with the patient or authorized representative who has indicated his/her understanding and acceptance.     Dental Advisory Given  Plan Discussed with: Anesthesiologist, CRNA and Surgeon  Anesthesia Plan Comments: (Patient reports no bleeding problems and no anticoagulant use.  Plan for spinal with backup GA  Patient consented for risks of anesthesia including but not limited to:  - adverse reactions to medications - damage to eyes, teeth, lips or other oral mucosa - nerve damage due to positioning  - risk of bleeding, infection and or nerve damage from spinal that could lead to paralysis - risk of headache or failed spinal - damage to teeth, lips or other oral mucosa - sore throat or hoarseness - damage to heart, brain, nerves, lungs, other parts of body or loss of life  Patient voiced understanding.)       Anesthesia Quick Evaluation

## 2022-08-18 NOTE — Evaluation (Signed)
Physical Therapy Evaluation Patient Details Name: Deanna Johnson MRN: [id number] DOB: September 21, 1950 Today's Date: 08/18/2022  History of Present Illness  Patient is a 72 year old female with  degenerative joint disease, left knee. S/p left total knee arthroplasty.  Clinical Impression  Patient is agreeable to PT. She lives alone and typically uses a cane for ambulation.  The patient is motivated to participate with PT. She was able to stand and initiate gait training. The patient walked 37f with the rolling walker with Min guard assistance and cues for rolling walker placement. No dizziness reported in standing. Recommend to continue PT to maximize independence. Anticipate patient can return home with HHPT. She has DME in place already.      Recommendations for follow up therapy are one component of a multi-disciplinary discharge planning process, led by the attending physician.  Recommendations may be updated based on patient status, additional functional criteria and insurance authorization.  Follow Up Recommendations Home health PT      Assistance Recommended at Discharge PRN  Patient can return home with the following  Assist for transportation;Help with stairs or ramp for entrance    Equipment Recommendations None recommended by PT  Recommendations for Other Services       Functional Status Assessment Patient has had a recent decline in their functional status and demonstrates the ability to make significant improvements in function in a reasonable and predictable amount of time.     Precautions / Restrictions Precautions Precautions: Knee Precaution Booklet Issued: Yes (comment) Restrictions Weight Bearing Restrictions: Yes LLE Weight Bearing: Weight bearing as tolerated      Mobility  Bed Mobility Overal bed mobility: Needs Assistance Bed Mobility: Supine to Sit, Sit to Supine     Supine to sit: Supervision Sit to supine: Min guard   General bed mobility comments:  verbal cues for technique    Transfers Overall transfer level: Needs assistance Equipment used: Rolling walker (2 wheels) Transfers: Sit to/from Stand Sit to Stand: Min guard           General transfer comment: verbal cues for safety    Ambulation/Gait Ambulation/Gait assistance: Min guard Gait Distance (Feet): 50 Feet Assistive device: Rolling walker (2 wheels) Gait Pattern/deviations: Step-to pattern, Step-through pattern, Decreased stride length Gait velocity: decreased     General Gait Details: verbal cues for appropriate positioning of rolling walker  Stairs            Wheelchair Mobility    Modified Rankin (Stroke Patients Only)       Balance Overall balance assessment: Needs assistance Sitting-balance support: Feet supported Sitting balance-Leahy Scale: Good     Standing balance support: Bilateral upper extremity supported, During functional activity Standing balance-Leahy Scale: Fair                               Pertinent Vitals/Pain Pain Assessment Pain Assessment: Faces Faces Pain Scale: Hurts a little bit Pain Location: L knee Pain Descriptors / Indicators: Discomfort Pain Intervention(s): Limited activity within patient's tolerance, Monitored during session, Repositioned (polar care re-applied)    Home Living Family/patient expects to be discharged to:: Private residence Living Arrangements: Alone Available Help at Discharge: Family;Friend(s) Type of Home: House Home Access: Stairs to enter Entrance Stairs-Rails: LChemical engineerof Steps: 2   Home Layout: One level Home Equipment: RConservation officer, nature(2 wheels);Cane - single point;Tub bench;Adaptive equipment      Prior Function Prior Level of Function :  Independent/Modified Independent             Mobility Comments: using single point cane       Hand Dominance        Extremity/Trunk Assessment   Upper Extremity Assessment Upper Extremity  Assessment: Overall WFL for tasks assessed    Lower Extremity Assessment Lower Extremity Assessment: LLE deficits/detail LLE Deficits / Details: patient can complete SLR, activate hip/knee/ankle movement LLE Sensation: WNL       Communication   Communication: No difficulties  Cognition Arousal/Alertness: Awake/alert Behavior During Therapy: WFL for tasks assessed/performed Overall Cognitive Status: Within Functional Limits for tasks assessed                                          General Comments      Exercises Total Joint Exercises Goniometric ROM: L knee 5-80 degrees Other Exercises Other Exercises: issued TKA HEP   Assessment/Plan    PT Assessment Patient needs continued PT services  PT Problem List Decreased strength;Decreased range of motion;Decreased activity tolerance;Decreased balance;Decreased mobility;Decreased safety awareness       PT Treatment Interventions DME instruction;Gait training;Stair training;Functional mobility training;Therapeutic activities;Therapeutic exercise;Balance training    PT Goals (Current goals can be found in the Care Plan section)  Acute Rehab PT Goals Patient Stated Goal: to return home PT Goal Formulation: With patient Time For Goal Achievement: 09/01/22 Potential to Achieve Goals: Good    Frequency BID     Co-evaluation               AM-PAC PT "6 Clicks" Mobility  Outcome Measure Help needed turning from your back to your side while in a flat bed without using bedrails?: None Help needed moving from lying on your back to sitting on the side of a flat bed without using bedrails?: A Little Help needed moving to and from a bed to a chair (including a wheelchair)?: A Little Help needed standing up from a chair using your arms (e.g., wheelchair or bedside chair)?: A Little Help needed to walk in hospital room?: A Little Help needed climbing 3-5 steps with a railing? : A Little 6 Click Score: 19     End of Session Equipment Utilized During Treatment: Gait belt Activity Tolerance: Patient tolerated treatment well Patient left: in bed;with call bell/phone within reach;with bed alarm set (polar care in place) Nurse Communication: Mobility status PT Visit Diagnosis: Other abnormalities of gait and mobility (R26.89);Difficulty in walking, not elsewhere classified (R26.2)    Time: 4765-4650 PT Time Calculation (min) (ACUTE ONLY): 31 min   Charges:   PT Evaluation $PT Eval Low Complexity: 1 Low PT Treatments $Gait Training: 8-22 mins        Minna Merritts, PT, MPT   Percell Locus 08/18/2022, 3:56 PM

## 2022-08-18 NOTE — Progress Notes (Signed)
Bladder scanned for 233 mls. No c/o bladder distention or discomfort. Able to slightly wiggle toes. Full sensation has not returned yet s/p spinal.

## 2022-08-18 NOTE — TOC Progression Note (Addendum)
Transition of Care [facility]) - Progression Note    Patient Details  Name: Deanna Johnson MRN: [id number] Date of Birth: 13-Mar-1951  Transition of Care [facility]) CM/SW [place], RN Phone Number: 08/18/2022, 3:08 PM  Clinical Narrative:     The patient was set up with [facility] for [facility] prior to surgery by surgeons office  Per PT eval she has  Rolling Walker (2 wheels);Cane - single point;Tub bench;Adaptive equipment n at home       Expected Discharge Plan and Services                                               Social Determinants of Health (SDOH) Interventions SDOH Screenings   Food Insecurity: No Food Insecurity (08/18/2022)  Housing: Low Risk  (08/18/2022)  Transportation Needs: No Transportation Needs (08/18/2022)  Utilities: Not At Risk (08/18/2022)  Alcohol Screen: Low Risk  (04/06/2022)  Depression (PHQ2-9): Medium Risk (07/12/2022)  Financial Resource Strain: Low Risk  (04/06/2022)  Physical Activity: Insufficiently Active (04/06/2022)  Social Connections: Socially Isolated (04/06/2022)  Stress: No Stress Concern Present (04/06/2022)  Tobacco Use: Medium Risk (08/18/2022)    Readmission Risk Interventions     No data to display

## 2022-08-18 NOTE — Progress Notes (Incomplete)
Bladder scanned again for 357 mls. Placed on bedpan and voided 400 mls. Spinal has resolved. Able to wiggle toes, bend knees, void and has full sensation in bil legs.

## 2022-08-18 NOTE — H&P (Signed)
History of Present Illness: Deanna Johnson is a 72 y.o.female who is is here for history and physical for an upcoming left total knee arthroplasty to be done by Dr. Roland Rack on August 18, 2022. The symptoms began several years ago and developed without any specific cause or injury. Initially, the patient thought that her symptoms were related to her back as she has a long history of lumbar stenosis. However, when she noticed the knee pain moving to the front of her knee, she chose to see orthopedics for the symptoms. The patient was treated about 6 months ago and has received both a steroid injection as well as a series of viscosupplementation injections, all with limited benefit. Therefore, she has been referred to me to discuss further treatment alternatives. She reports 6/10 pain. The pain is located along the medial aspect of the knee. The pain is described as aching, dull, stabbing, and throbbing. The symptoms are aggravated with normal daily activities, with sleeping, using stairs, at higher levels of activity, rising from a chair, walking, and standing. She also describes occasional catching in her knee. She has associated swelling and mild deformity. She has tried acetaminophen, over-the-counter medications, steroid injections, viscosupplementation injections, and a home exercise program with limited benefit. The patient ambulates with a cane for balance and support. She is unable to reciprocate stairs. The patient is not a diabetic.  Current Outpatient Medications: acetaminophen (TYLENOL) 650 MG ER tablet Take 650 mg by mouth every 8 (eight) hours as needed for Pain  FLUoxetine (PROZAC) 20 MG capsule Take 20 mg by mouth at bedtime  fluticasone propionate (FLONASE) 50 mcg/actuation nasal spray Place 2 sprays into both nostrils once daily as needed  hydroCHLOROthiazide (HYDRODIURIL) 25 MG tablet Take 25 mg by mouth every morning  levothyroxine (SYNTHROID) 100 MCG tablet  lisinopril  (PRINIVIL,ZESTRIL) 20 MG tablet Take 1 tablet (20 mg total) by mouth once daily. (Patient taking differently: Take 20 mg by mouth at bedtime) 30 tablet 11  methocarbamoL (ROBAXIN) 500 MG tablet Take 1 tablet (500 mg total) by mouth 3 (three) times daily as needed 30 tablet 1  multivitamin-minerals-lutein (CENTRUM SILVER) Tab 1 tab by mouth daily  zinc gluconate 50 mg tablet Take by mouth  albuterol 90 mcg/actuation inhaler Inhale 2 inhalations into the lungs every 6 (six) hours as needed for Wheezing for up to 30 days 1 each 0  HYDROcodone-chlorpheniramine (TUSSIONEX) 10-8 mg/5 mL ER suspension Take 5 mLs by mouth at bedtime as needed for Cough (Patient not taking: Reported on 07/13/2022) 25 mL 0  magnesium 30 mg tablet Take 30 mg by mouth 2 (two) times daily (Patient not taking: Reported on 07/13/2022)  UNABLE TO FIND Compounded T3/ T4 T3 57/ t4 13.5 compounded combo pill take BID (Patient not taking: Reported on 08/10/2022) 60 capsule 1   Allergies: No Known Allergies  Past Medical History:  Arthritis  Depression  Fibromyalgia  Hashimoto's disease  Hypertension  Osteoarthritis  Thyroid disease   Past Surgical History:  maxillary facial surgery 1981  upper and lower jaw resections  BUNIONECTOMY Left 1994  COLONOSCOPY 12/30/2009  ARTHROSCOPY SHOULDER Left 01/30/2015  ORCR  COLONOSCOPY 07/21/2017 (Tubular adenoma of the colon/Repeat 60yr/MUS)  EYE SURGERY Bilateral 2020 (cataracts)  ARTHRODESIS ANTERIOR CERVICLE SPINE N/A 02/23/2021  Procedure: Anterior Cervical Discectomy and fusion Cervical 3-4, 4-5, 5-6, 6-7 with Medtronic Cornerstone LS Allograft and Medtronic Atlantis plating with intraop monitoring SSEP MEP EMG; Surgeon: HRedge Gainer MD; [place] Service: Neurosurgery; Laterality: N/A;  INSTRUMENTATION  ANTERIOR SPINE 8/MORE SEGMENTS N/A 02/23/2021  Procedure: Atlantis Plating; Surgeon: Redge Gainer, MD; Location: [place]; Service: Neurosurgery; Laterality:  N/A;  INSERTION STRUCTURAL BONE ALLOGRAFT FOR SPINE SURGERY N/A 02/23/2021  Procedure: INSERTION STRUCTURAL BONE ALLOGRAFT FOR SPINE SURGERY [id number] x3; Surgeon: Redge Gainer, MD; Location: [place]; Service: Neurosurgery; Laterality: N/A;  ARTHRODESIS ANTERIOR CERVICLE SPINE N/A 02/23/2021  Procedure: ARTHRODESIS ANT INTERBODY INC DISCECTOMY, CERVICAL BELOW C2 EACH ADDL; Surgeon: Redge Gainer, MD; Location: [place]; Service: Neurosurgery; Laterality: N/A;  BREAST SURGERY  COLONOSCOPY  HYSTERECTOMY  right breast biopsy (benign - mass removed)  SHOULDER SURGERY Right (Rotator cuff surgery)  TONSILLECTOMY   Family History:  High blood pressure (Hypertension) Mother  High blood pressure (Hypertension) Father  Aneurysm Father  Colon polyps Sister   Social History:   Socioeconomic History:  Marital status: Divorced  Tobacco Use  Smoking status: Former  Packs/day: 1.00  Years: 4.00  Additional pack years: 0.00  Total pack years: 4.00  Types: Cigarettes  Quit date: 1975  Years since quitting: 49.0  Smokeless tobacco: Never  Vaping Use  Vaping Use: Never used  Substance and Sexual Activity  Alcohol use: No  Alcohol/week: 0.0 standard drinks of alcohol  Drug use: Never  Sexual activity: Defer  Social History Narrative  XB:[id number] to be called [name]. She is divorced, BA degree, prior smoker quit 1975, Nurse, [employer].   Review of Systems:  A comprehensive 14 point ROS was performed, reviewed, and the pertinent orthopaedic findings are documented in the HPI.  Physical Exam: Vitals:  08/10/22 0946  BP: (!) 160/60  Weight: 100.3 kg (221 lb 3.2 oz)  Height: 154.9 cm (5' 0.99")  PainSc: 5  PainLoc: Knee   General/Constitutional: Pleasant significantly overweight elderly female in no acute distress. Neuro/Psych: Normal mood and affect, oriented to person, place and time. Eyes: Non-icteric. Pupils are equal, round, and reactive to light, and exhibit  synchronous movement. Lymphatic: No palpable adenopathy. Respiratory: Lungs clear to auscultation, Normal chest excursion, No wheezes, and Non-labored breathing Cardiovascular: Regular rate and rhythm. No murmurs. and No edema, swelling or tenderness, except as noted in detailed exam. Vascular: No edema, swelling or tenderness, except as noted in detailed exam. Integumentary: No impressive skin lesions present, except as noted in detailed exam. Musculoskeletal: Unremarkable, except as noted in detailed exam.  Heart: Examination of the heart reveals regular, rate, and rhythm. There is no murmur noted on ascultation. There is a normal apical pulse.  Lungs: Lungs are clear to auscultation. There is no wheeze, rhonchi, or crackles. There is normal expansion of bilateral chest walls.   Left knee exam: GAIT: mild limp and uses a cane. ALIGNMENT: mild varus SKIN: unremarkable SWELLING: mild EFFUSION: small WARMTH: no warmth TENDERNESS: Mild-moderate over the medial joint line, minimal tenderness along lateral joint line ROM: 0 to 95 degrees with mild discomfort in maximal flexion McMURRAY'S: equivocal PATELLOFEMORAL: normal tracking with no peri-patellar tenderness and negative apprehension sign CREPITUS: no LACHMAN'S: negative PIVOT SHIFT: negative ANTERIOR DRAWER: negative POSTERIOR DRAWER: negative VARUS/VALGUS: Mildly positive pseudolaxity to varus stressing  She is neurovascularly intact to the left lower extremity and foot.  Knee Imaging: Recent AP weightbearing of both knees, as well as lateral and Lutricia Feil views of the left knee are available for review and have been reviewed by myself. These films demonstrate moderate-severe degenerative changes, primarily involving the medial compartment with 90-100% medial joint space narrowing. Overall alignment is mild varus. No fractures, lytic lesions, or abnormal  calcifications are noted.  Assessment:  1. Primary osteoarthritis of left  knee.  2. Morbid obesity with BMI of 40.0-44.9, adult.   Plan: The treatment options were discussed with the patient. In addition, patient educational materials were provided regarding the diagnosis and treatment options. The patient is quite frustrated by her symptoms and functional limitations, and is ready to consider more aggressive treatment options. Therefore, I have recommended a surgical procedure, specifically a left total knee arthroplasty. The procedure was discussed with the patient, as were the potential risks (including bleeding, infection, nerve and/or blood vessel injury, persistent or recurrent pain, loosening and/or failure of the components, dislocation, need for further surgery, blood clots, strokes, heart attacks and/or arhythmias, pneumonia, etc.) and benefits. The patient states his/her understanding and wishes to proceed. All of the patient's questions and concerns were answered. She can call any time with further concerns. She will follow up post-surgery, routine.    H&P reviewed and patient re-examined. No changes.

## 2022-08-18 NOTE — Transfer of Care (Signed)
Immediate Anesthesia Transfer of Care Note  Patient: Deanna Johnson  Procedure(s) Performed: TOTAL KNEE ARTHROPLASTY (Left: Knee)  Patient Location: PACU  Anesthesia Type:General  Level of Consciousness: awake, alert , and oriented  Airway & Oxygen Therapy: Patient Spontanous Breathing  Post-op Assessment: Report given to RN and Post -op Vital signs reviewed and stable  Post vital signs: Reviewed and stable  Last Vitals:  Vitals Value Taken Time  BP 141/57 08/18/22 1005  Temp    Pulse 90 08/18/22 1007  Resp 9 08/18/22 1007  SpO2 98 % 08/18/22 1007  Vitals shown include unvalidated device data.  Last Pain:  Vitals:   08/18/22 0615  TempSrc: Oral  PainSc: 8          Complications: No notable events documented.

## 2022-08-18 NOTE — Op Note (Signed)
08/18/2022  10:07 AM  Patient:   Deanna Johnson  Pre-Op Diagnosis:   Degenerative joint disease, left knee.  Post-Op Diagnosis:   Same  Procedure:   left TKA using all-cemented Biomet Vanguard system with a 65 mm PCR femur, a 67 mm tibial tray with a 10 mm E-poly insert, and a 31 x 6.2 mm all-poly 3-pegged domed patella.  Surgeon:   Pascal Lux, MD  Assistant:   Cameron Proud, PA-C   Anesthesia:   Spinal  Findings:   As above  Complications:   None  EBL:   10 cc  Fluids:   500 cc crystalloid  UOP:   None  TT:   100 minutes at 300 mmHg  Drains:   None  Closure:   Staples  Implants:   As above  Brief Clinical Note:   The patient is a 72 year old female with a long history of progressively worsening left knee pain. The patient's symptoms have progressed despite medications, activity modification, injections, etc. The patient's history and examination were consistent with advanced degenerative joint disease of the left knee confirmed by plain radiographs. The patient presents at this time for a left total knee arthroplasty.  Procedure:   The patient was brought into the operating room. After adequate spinal anesthesia was obtained, the patient was lain in the supine position before the left lower extremity was prepped with ChloraPrep solution and draped sterilely. Preoperative antibiotics were administered. A timeout was performed to verify the appropriate surgical site before the limb was exsanguinated with an Esmarch and the tourniquet inflated to 300 mmHg.   A standard anterior approach to the knee was made through an approximately 7 inch incision. The incision was carried down through the subcutaneous tissues to expose superficial retinaculum. This was split the length of the incision and the medial flap elevated sufficiently to expose the medial retinaculum. The medial retinaculum was incised, leaving a 3-4 mm cuff of tissue on the patella. This was extended distally along  the medial border of the patellar tendon and proximally through the medial third of the quadriceps tendon. A subtotal fat pad excision was performed before the soft tissues were elevated off the anteromedial and anterolateral aspects of the proximal tibia to the level of the collateral ligaments. The anterior portions of the medial and lateral menisci were removed, as was the anterior cruciate ligament. With the knee flexed to 90, the external tibial guide was positioned and the appropriate proximal tibial cut made. This piece was taken to the back table where it was measured and found to be optimally replicated by a 67 mm component.  Attention was directed to the distal femur. The intramedullary canal was accessed through a 3/8" drill hole. The intramedullary guide was inserted and positioned in order to obtain a neutral flexion gap. The intercondylar block was positioned with care taken to avoid notching the anterior cortex of the femur. The appropriate cut was made. Next, the distal cutting block was placed at 5 of valgus alignment. Using the 9 mm slot, the distal cut was made. The distal femur was measured and found to be optimally replicated by the 65 mm component. The 65 mm 4-in-1 cutting block was positioned and first the posterior, then the posterior chamfer, the anterior chamfer, and finally the anterior cuts were made. At this point, the posterior portions medial and lateral menisci were removed. A trial reduction was performed using the appropriate femoral and tibial components with the 10 mm insert. This demonstrated excellent  stability to varus and valgus stressing both in flexion and extension while permitting full extension. Patella tracking was assessed and found to be excellent. Therefore, the tibial guide position was marked on the proximal tibia. The patella thickness was measured and found to be 20 mm. Therefore, the appropriate cut was made. The patellar surface was measured and found to be  optimally replicated by the 31 mm component. The three peg holes were drilled in place before the trial button was inserted. Patella tracking was assessed and found to be excellent, passing the "no thumb test". The lug holes were drilled into the distal femur before the trial component was removed, leaving only the tibial tray. The keel was then created using the appropriate tower, reamer, and punch.  The bony surfaces were prepared for cementing by irrigating them thoroughly with sterile saline solution via the jet lavage system. A bone plug was fashioned from some of the bone that had been removed previously and used to plug the distal femoral canal. In addition, 20 cc of Exparel diluted out to 60 cc with normal saline and 30 cc of 0.5% Sensorcaine were injected into the postero-medial and postero-lateral aspects of the knee, the medial and lateral gutter regions, and the peri-incisional tissues to help with postoperative analgesia. Meanwhile, the cement was being mixed on the back table. When it was ready, the tibial tray was cemented in first. The excess cement was removed using [employer] fast streamer. Next, the femoral component was impacted into place. Again, the excess cement was removed using [employer] fast streamer. The 10 mm trial insert was positioned and the knee brought into extension while the cement hardened. Finally, the patella was cemented into place and secured using the patellar clamp. Again, the excess cement was removed using [employer] fast streamer. Once the cement had hardened, the knee was placed through a range of motion with the findings as described above. Therefore, the trial insert was removed and, after verifying that no cement had been retained posteriorly, the permanent 10 mm anterior stabilized E-polyethylene insert was positioned and secured using the appropriate key locking mechanism. Again the knee was placed through a range of motion with the findings as described above.  The wound was copiously  irrigated with sterile saline solution using the jet lavage system before the quadriceps tendon and retinacular layer were reapproximated using #0 Vicryl interrupted sutures. The superficial retinacular layer also was closed using a running #0 Vicryl suture. A total of 10 cc of transexemic acid (TXA) was injected intra-articularly before the subcutaneous tissues were closed in several layers using 2-0 Vicryl interrupted sutures. The skin was closed using staples. A sterile honeycomb dressing was applied to the skin before the leg was wrapped with an Ace wrap to accommodate the Polar Care device. The patient was then awakened and returned to the recovery room in satisfactory condition after tolerating the procedure well.

## 2022-08-19 DIAGNOSIS — Z79899 Other long term (current) drug therapy: Secondary | ICD-10-CM | POA: Diagnosis not present

## 2022-08-19 DIAGNOSIS — Z6841 Body Mass Index (BMI) 40.0 and over, adult: Secondary | ICD-10-CM | POA: Diagnosis not present

## 2022-08-19 DIAGNOSIS — E039 Hypothyroidism, unspecified: Secondary | ICD-10-CM | POA: Diagnosis not present

## 2022-08-19 DIAGNOSIS — M1712 Unilateral primary osteoarthritis, left knee: Secondary | ICD-10-CM | POA: Diagnosis not present

## 2022-08-19 DIAGNOSIS — I10 Essential (primary) hypertension: Secondary | ICD-10-CM | POA: Diagnosis not present

## 2022-08-19 DIAGNOSIS — Z87891 Personal history of nicotine dependence: Secondary | ICD-10-CM | POA: Diagnosis not present

## 2022-08-19 LAB — BASIC METABOLIC PANEL
Anion gap: 8 (ref 5–15)
BUN: 22 mg/dL (ref 8–23)
CO2: 24 mmol/L (ref 22–32)
Calcium: 8.5 mg/dL — ABNORMAL LOW (ref 8.9–10.3)
Chloride: 101 mmol/L (ref 98–111)
Creatinine, Ser: 0.82 mg/dL (ref 0.44–1.00)
GFR, Estimated: >60 mL/min (ref >60)
Glucose, Bld: 137 mg/dL — ABNORMAL HIGH (ref 70–99)
Potassium: 4 mmol/L (ref 3.5–5.1)
Sodium: 133 mmol/L — ABNORMAL LOW (ref 135–145)

## 2022-08-19 LAB — CBC
HCT: 36.3 % (ref 36.0–46.0)
Hemoglobin: 11.8 g/dL — ABNORMAL LOW (ref 12.0–15.0)
MCH: 27.5 pg (ref 26.0–34.0)
MCHC: 32.5 g/dL (ref 30.0–36.0)
MCV: 84.6 fL (ref 80.0–100.0)
Platelets: 258 10*3/uL (ref 150–400)
RBC: 4.29 MIL/uL (ref 3.87–5.11)
RDW: 13.4 % (ref 11.5–15.5)
WBC: 10.2 10*3/uL (ref 4.0–10.5)
nRBC: 0 % (ref 0.0–0.2)

## 2022-08-19 MED ORDER — METHOCARBAMOL 500 MG PO TABS
ORAL_TABLET | ORAL | Status: AC
  Administered 2022-08-19: 500 mg via ORAL
  Filled 2022-08-19: qty 1

## 2022-08-19 MED ORDER — ONDANSETRON HCL 4 MG PO TABS: 4.0000 mg | ORAL_TABLET | Freq: Four times a day (QID) | ORAL | 0 refills | Status: DC | PRN

## 2022-08-19 MED ORDER — OXYCODONE HCL 5 MG PO TABS: 5.0000 mg | ORAL_TABLET | ORAL | 0 refills | Status: DC | PRN

## 2022-08-19 MED ORDER — ACETAMINOPHEN 500 MG PO TABS: 1000.0000 mg | ORAL_TABLET | Freq: Four times a day (QID) | ORAL | 0 refills | Status: DC

## 2022-08-19 MED ORDER — APIXABAN 2.5 MG PO TABS
ORAL_TABLET | ORAL | Status: AC
  Administered 2022-08-19: 2.5 mg via ORAL
  Filled 2022-08-19: qty 1

## 2022-08-19 MED ORDER — ACETAMINOPHEN 500 MG PO TABS
ORAL_TABLET | ORAL | Status: AC
  Filled 2022-08-19: qty 2

## 2022-08-19 MED ORDER — KETOROLAC TROMETHAMINE 15 MG/ML IJ SOLN
INTRAMUSCULAR | Status: AC
  Administered 2022-08-19: 7.5 mg via INTRAVENOUS
  Filled 2022-08-19: qty 1

## 2022-08-19 MED ORDER — OXYCODONE HCL 5 MG PO TABS
ORAL_TABLET | ORAL | Status: AC
  Administered 2022-08-19: 5 mg via ORAL
  Filled 2022-08-19: qty 1

## 2022-08-19 MED ORDER — LISINOPRIL 20 MG PO TABS
ORAL_TABLET | ORAL | Status: AC
  Administered 2022-08-19: 20 mg via ORAL
  Filled 2022-08-19: qty 1

## 2022-08-19 MED ORDER — HYDROCHLOROTHIAZIDE 25 MG PO TABS
ORAL_TABLET | ORAL | Status: AC
  Administered 2022-08-19: 25 mg via ORAL
  Filled 2022-08-19: qty 1

## 2022-08-19 MED ORDER — APIXABAN 2.5 MG PO TABS: 2.5000 mg | ORAL_TABLET | Freq: Two times a day (BID) | ORAL | 0 refills | Status: DC

## 2022-08-19 MED ORDER — FLUOXETINE HCL 20 MG PO CAPS
ORAL_CAPSULE | ORAL | Status: AC
  Administered 2022-08-19: 20 mg via ORAL
  Filled 2022-08-19: qty 1

## 2022-08-19 MED ORDER — DOCUSATE SODIUM 100 MG PO CAPS
ORAL_CAPSULE | ORAL | Status: AC
  Administered 2022-08-19: 100 mg via ORAL
  Filled 2022-08-19: qty 1

## 2022-08-19 MED ORDER — ACETAMINOPHEN 500 MG PO TABS
ORAL_TABLET | ORAL | Status: AC
  Administered 2022-08-19: 1000 mg via ORAL
  Filled 2022-08-19: qty 2

## 2022-08-19 NOTE — Discharge Instructions (Signed)

## 2022-08-19 NOTE — Progress Notes (Signed)
Physical Therapy Treatment Patient Details Name: Deanna Johnson MRN: [id number] DOB: 08-02-1950 Today's Date: 08/19/2022   History of Present Illness Patient is a 72 year old female with  degenerative joint disease, left knee. S/p left total knee arthroplasty.    PT Comments    Patient has made excellent progress and has met goals adequate for discharge home. Increased gait distance today with reinforcement of heel-toe gait pattern and increased gait speed/step length with increased ambulation distance. The patient completed stair training as well. Her son plans to transport her home and stay for several days to assist as needed. Recommend HHPT at discharge.    Recommendations for follow up therapy are one component of a multi-disciplinary discharge planning process, led by the attending physician.  Recommendations may be updated based on patient status, additional functional criteria and insurance authorization.  Follow Up Recommendations  Home health PT     Assistance Recommended at Discharge PRN  Patient can return home with the following Assist for transportation;Help with stairs or ramp for entrance   Equipment Recommendations  None recommended by PT    Recommendations for Other Services       Precautions / Restrictions Precautions Precautions: Knee Precaution Booklet Issued: Yes (comment) Restrictions Weight Bearing Restrictions: Yes LLE Weight Bearing: Weight bearing as tolerated     Mobility  Bed Mobility Overal bed mobility: Needs Assistance Bed Mobility: Supine to Sit, Sit to Supine     Supine to sit: Supervision Sit to supine: Supervision   General bed mobility comments: reinforced safe technique for getting back to bed.    Transfers Overall transfer level: Needs assistance Equipment used: Rolling walker (2 wheels) Transfers: Sit to/from Stand Sit to Stand: Supervision           General transfer comment: good safety awareness demonstrated     Ambulation/Gait Ambulation/Gait assistance: Supervision Gait Distance (Feet): 120 Feet (x 2 bouts) Assistive device: Rolling walker (2 wheels) Gait Pattern/deviations: Step-through pattern Gait velocity: decreased     General Gait Details: reinforcement of heel-toe gait pattern. gait speed and step length increased with increased ambulation distance. mild dizziness initially with walking   Stairs Stairs: Yes Stairs assistance: Supervision, Min guard Stair Management: Two rails, Step to pattern, Forwards Number of Stairs: 4 General stair comments: with initial instruction of sequencing, patient was able to navigate 4 steps with rails   Wheelchair Mobility    Modified Rankin (Stroke Patients Only)       Balance Overall balance assessment: Needs assistance Sitting-balance support: Feet supported Sitting balance-Leahy Scale: Good     Standing balance support: Bilateral upper extremity supported Standing balance-Leahy Scale: Fair                              Cognition Arousal/Alertness:  (polar care re-applied) Behavior During Therapy: WFL for tasks assessed/performed Overall Cognitive Status: Within Functional Limits for tasks assessed                                          Exercises Total Joint Exercises Goniometric ROM: L knee 5-84 Other Exercises Other Exercises: HEP at the bedside. discussed quality of exercise versus quantity of exercises. patient has no other questions at this time    General Comments        Pertinent Vitals/Pain Pain Assessment Pain Assessment: Faces Faces Pain Scale:  Hurts a little bit Pain Location: L knee Pain Descriptors / Indicators: Discomfort Pain Intervention(s): Limited activity within patient's tolerance, Monitored during session, Repositioned    Home Living                          Prior Function            PT Goals (current goals can now be found in the care plan section)  Acute Rehab PT Goals Patient Stated Goal: to return home PT Goal Formulation: With patient Time For Goal Achievement: 09/01/22 Potential to Achieve Goals: Good Progress towards PT goals: Progressing toward goals    Frequency    BID      PT Plan Current plan remains appropriate    Co-evaluation              AM-PAC PT "6 Clicks" Mobility   Outcome Measure  Help needed turning from your back to your side while in a flat bed without using bedrails?: None Help needed moving from lying on your back to sitting on the side of a flat bed without using bedrails?: A Little Help needed moving to and from a bed to a chair (including a wheelchair)?: A Little Help needed standing up from a chair using your arms (e.g., wheelchair or bedside chair)?: A Little Help needed to walk in hospital room?: A Little Help needed climbing 3-5 steps with a railing? : A Little 6 Click Score: 19    End of Session Equipment Utilized During Treatment: Gait belt Activity Tolerance: Patient tolerated treatment well Patient left: in bed;with call bell/phone within reach;with SCD's reapplied (polar care re-applied, set-up woth breakfast tray) Nurse Communication: Mobility status PT Visit Diagnosis: Other abnormalities of gait and mobility (R26.89);Difficulty in walking, not elsewhere classified (R26.2)     Time: 9758-8325 PT Time Calculation (min) (ACUTE ONLY): 38 min  Charges:  $Gait Training: 23-37 mins $Therapeutic Activity: 8-22 mins                     Minna Merritts, PT, MPT    Percell Locus 08/19/2022, 10:48 AM

## 2022-08-19 NOTE — Discharge Summary (Signed)
Physician Discharge Summary  Patient ID: Deanna Johnson MRN: [id number] DOB/AGE: [birth year] 72 y.o.  Admit date: 08/18/2022 Discharge date: 08/19/2022  Admission Diagnoses:  Status post total knee replacement using cement, left [Z96.652] Left knee degenerative joint disease  Discharge Diagnoses: Patient Active Problem List   Diagnosis Date Noted   Status post total knee replacement using cement, left 08/18/2022   Lumbar radiculitis 04/29/2020   Cervical spondylosis with myelopathy 10/25/2018   BMI 40.0-44.9, adult ([place]) 06/27/2018   Hyperlipidemia, mixed 05/05/2017   Hypothyroidism due to Hashimoto's thyroiditis 05/05/2017   Recurrent major depression in partial remission ([place]) 04/11/2016   Hx of colonic polyp 04/11/2016   Degenerative arthritis of knee 12/31/2014   Essential hypertension 12/31/2014   Impingement syndrome of left shoulder 12/16/2014   H/O fibromyalgia 08/18/2012    Past Medical History:  Diagnosis Date   Allergy    Arthritis    Complete tear of rotator cuff    RIGHT   Depression    Fibromyalgia    Heart murmur    slight no problems   History of tick-borne disease    Hyperlipidemia, mixed 05/05/2017   Hypertension    Hypothyroidism    Nose colonized with MRSA 08/08/2022   a.) PCR (+) prior to LEFT TKA   Shoulder pain, left    TORN ROTATOR CUFF     Transfusion: None.   Consultants (if any):   Discharged Condition: Improved  Hospital Course: Deanna Johnson is an 72 y.o. female who was admitted 08/18/2022 with a diagnosis of left knee degenerative joint disease and went to the operating room on 08/18/2022 and underwent the above named procedures.    Surgeries: Procedure(s): TOTAL KNEE ARTHROPLASTY on 08/18/2022 Patient tolerated the surgery well. Taken to PACU where she was stabilized and then transferred to the orthopedic floor.  Started on Eliquis 2.'5mg'$  every 12hrs. SCDs applied bilaterally. Heels elevated on bed with rolled towels. No evidence of  DVT. Negative Homan. Physical therapy started on day #1 for gait training and transfer. OT started day #1 for ADL and assisted devices.  Patient's IV was removed on POD1.  Implants: left TKA using all-cemented Biomet Vanguard system with a 65 mm PCR femur, a 67 mm tibial tray with a 10 mm E-poly insert, and a 31 x 6.2 mm all-poly 3-pegged domed patella.   She was given perioperative antibiotics:  Anti-infectives (From admission, onward)    Start     Dose/Rate Route Frequency Ordered Stop   08/18/22 1900  vancomycin (VANCOCIN) IVPB 1000 mg/200 mL premix        1,000 mg 200 mL/hr over 60 Minutes Intravenous Every 12 hours 08/18/22 1340 08/18/22 1956   08/18/22 0615  ceFAZolin (ANCEF) IVPB 2g/100 mL premix        2 g 200 mL/hr over 30 Minutes Intravenous On call to O.R. 08/18/22 0601 08/18/22 0756   08/18/22 0615  vancomycin (VANCOCIN) IVPB 1000 mg/200 mL premix        1,000 mg 200 mL/hr over 60 Minutes Intravenous  Once 08/18/22 0601 08/18/22 0756   08/18/22 0609  ceFAZolin (ANCEF) 2-4 GM/100ML-% IVPB       Note to Pharmacy: Norton Blizzard A: cabinet override      08/18/22 0609 08/18/22 0802     .  She was given sequential compression devices, early ambulation, and Eliquis for DVT prophylaxis.  She benefited maximally from the hospital stay and there were no complications.    Recent vital signs:  Vitals:  08/18/22 2150 08/19/22 0615  BP: (!) 157/64 (!) 147/60  Pulse: 77 75  Resp: 16 16  Temp: 97.9 F (36.6 C) 97.6 F (36.4 C)  SpO2: 98% 96%    Recent laboratory studies:  Lab Results  Component Value Date   HGB 11.8 (L) 08/19/2022   HGB 13.8 08/08/2022   HGB 13.8 02/08/2022   Lab Results  Component Value Date   WBC 10.2 08/19/2022   PLT 258 08/19/2022   No results found for: "INR" Lab Results  Component Value Date   NA 133 (L) 08/19/2022   K 4.0 08/19/2022   CL 101 08/19/2022   CO2 24 08/19/2022   BUN 22 08/19/2022   CREATININE 0.82 08/19/2022    GLUCOSE 137 (H) 08/19/2022    Discharge Medications:   Allergies as of 08/19/2022   No Known Allergies      Medication List     STOP taking these medications    naproxen 500 MG tablet Commonly known as: NAPROSYN       TAKE these medications    acetaminophen 500 MG tablet Commonly known as: TYLENOL Take 2 tablets (1,000 mg total) by mouth every 6 (six) hours.   apixaban 2.5 MG Tabs tablet Commonly known as: ELIQUIS Take 1 tablet (2.5 mg total) by mouth 2 (two) times daily.   betamethasone dipropionate 0.05 % cream Apply 1 Application topically 2 (two) times daily.   CENTRUM SILVER PO Take by mouth.   CULTURELLE PROBIOTICS PO Take by mouth.   diclofenac Sodium 1 % Gel Commonly known as: Voltaren Apply 4 g topically 4 (four) times daily.   FLUoxetine 20 MG capsule Commonly known as: PROZAC TAKE (1) CAPSULE BY MOUTH EVERY DAY   fluticasone 50 MCG/ACT nasal spray Commonly known as: FLONASE PLACE 2 SPRAYS INTO BOTH NOSTRILS DAILY   hydrochlorothiazide 25 MG tablet Commonly known as: HYDRODIURIL TAKE (1) TABLET BY MOUTH EVERY DAY   levothyroxine 100 MCG tablet Commonly known as: SYNTHROID Take 1 tablet (100 mcg total) by mouth daily.   lisinopril 20 MG tablet Commonly known as: ZESTRIL TAKE (1) TABLET BY MOUTH EVERY DAY   methocarbamol 500 MG tablet Commonly known as: ROBAXIN Take 500 mg by mouth 4 (four) times daily.   ondansetron 4 MG tablet Commonly known as: ZOFRAN Take 1 tablet (4 mg total) by mouth every 6 (six) hours as needed for nausea.   oxyCODONE 5 MG immediate release tablet Commonly known as: Oxy IR/ROXICODONE Take 1-2 tablets (5-10 mg total) by mouth every 4 (four) hours as needed for moderate pain (pain score 4-6).               Durable Medical Equipment  (From admission, onward)           Start     Ordered   08/18/22 1004  DME Bedside commode  Once       Question:  Patient needs a bedside commode to treat with the  following condition  Answer:  Status post total knee replacement using cement, left   08/18/22 1003   08/18/22 1004  DME 3 n 1  Once        08/18/22 1003   08/18/22 1004  DME Walker rolling  Once       Question Answer Comment  Walker: With 5 Inch Wheels   Patient needs a walker to treat with the following condition Status post total knee replacement using cement, left      08/18/22 1003  Diagnostic Studies: DG Knee Left Port  Result Date: 08/18/2022 CLINICAL DATA:  Status post total knee replacement using cement, left EXAM: PORTABLE LEFT KNEE - 1-2 VIEW COMPARISON:  None Available. FINDINGS: Postsurgical changes of left total knee arthroplasty. Normal alignment. No evidence of periprosthetic fracture. Expected soft tissue changes including a joint effusion. IMPRESSION: Postsurgical changes of left total knee arthroplasty. No evidence of immediate hardware complication. Electronically Signed   By: Maurine Simmering M.D.   On: 08/18/2022 10:24    Disposition: Plan for discharge home today with HHPT pending progress with PT.   Follow-up Information     Reche Dixon, PA-C Follow up in 14 day(s).   Specialty: Orthopedic Surgery Why: Staple Removal. Contact information: [address] [place] [address] [phone]                Signed: Judson Roch PA-C 08/19/2022, 7:48 AM

## 2022-08-19 NOTE — Progress Notes (Signed)
Pt given discharge instructions. Pt verbalized understanding. Pt discharged via wheelchair to car with son to take home.

## 2022-08-19 NOTE — Anesthesia Postprocedure Evaluation (Signed)
Anesthesia Post Note  Patient: Deanna Johnson  Procedure(s) Performed: TOTAL KNEE ARTHROPLASTY (Left: Knee)  Patient location during evaluation: Nursing Unit Anesthesia Type: Spinal Level of consciousness: oriented and awake and alert Pain management: pain level controlled Vital Signs Assessment: post-procedure vital signs reviewed and stable Respiratory status: spontaneous breathing and respiratory function stable Cardiovascular status: blood pressure returned to baseline and stable Postop Assessment: no headache, no backache, no apparent nausea or vomiting and patient able to bend at knees Anesthetic complications: no   There were no known notable events for this encounter.   Last Vitals:  Vitals:   08/18/22 2150 08/19/22 0615  BP: (!) 157/64 (!) 147/60  Pulse: 77 75  Resp: 16 16  Temp: 36.6 C 36.4 C  SpO2: 98% 96%    Last Pain:  Vitals:   08/19/22 0615  TempSrc:   PainSc: 2                  Jaasia Viglione Lorenza Chick

## 2022-08-19 NOTE — Progress Notes (Signed)
  Subjective: 1 Day Post-Op Procedure(s) (LRB): TOTAL KNEE ARTHROPLASTY (Left) Patient reports pain as mild.   Patient is well, and has had no acute complaints or problems Plan is to go Home after hospital stay. Negative for chest pain and shortness of breath Fever: no Gastrointestinal:Negative for nausea and vomiting Reports she is passing gas and urinating without pain.  Objective: Vital signs in last 24 hours: Temp:  [97.3 F (36.3 C)-98.8 F (37.1 C)] 97.6 F (36.4 C) (02/02 0615) Pulse Rate:  [66-89] 75 (02/02 0615) Resp:  [13-23] 16 (02/02 0615) BP: (135-178)/(57-131) 147/60 (02/02 0615) SpO2:  [96 %-100 %] 96 % (02/02 0615)  Intake/Output from previous day:  Intake/Output Summary (Last 24 hours) at 08/19/2022 0743 Last data filed at 08/18/2022 1320 Gross per 24 hour  Intake 1500 ml  Output 400 ml  Net 1100 ml    Intake/Output this shift: No intake/output data recorded.  Labs: Recent Labs    08/19/22 0620  HGB 11.8*   Recent Labs    08/19/22 0620  WBC 10.2  RBC 4.29  HCT 36.3  PLT 258   Recent Labs    08/19/22 0620  NA 133*  K 4.0  CL 101  CO2 24  BUN 22  CREATININE 0.82  GLUCOSE 137*  CALCIUM 8.5*   No results for input(s): "LABPT", "INR" in the last 72 hours.   EXAM General - Patient is Alert, Appropriate, and Oriented Extremity - ABD soft Neurovascular intact Dorsiflexion/Plantar flexion intact Incision: scant drainage No cellulitis present Compartment soft Dressing/Incision - Minimal bloody drainage noted to the left knee. Motor Function - intact, moving foot and toes well on exam.  Abdomen soft with intact bowels sounds. Negative Homans to bilateral lower extremities.  Past Medical History:  Diagnosis Date   Allergy    Arthritis    Complete tear of rotator cuff    RIGHT   Depression    Fibromyalgia    Heart murmur    slight no problems   History of tick-borne disease    Hyperlipidemia, mixed 05/05/2017   Hypertension     Hypothyroidism    Nose colonized with MRSA 08/08/2022   a.) PCR (+) prior to LEFT TKA   Shoulder pain, left    TORN ROTATOR CUFF    Assessment/Plan: 1 Day Post-Op Procedure(s) (LRB): TOTAL KNEE ARTHROPLASTY (Left) Principal Problem:   Status post total knee replacement using cement, left  Estimated body mass index is 42.04 kg/m as calculated from the following:   Height as of this encounter: '5\' 1"'$  (1.549 m).   Weight as of this encounter: 100.9 kg. Advance diet Up with therapy D/C IV fluids when tolerating po intake.  Labs and vitals reviewed this AM. Patient is passing gas this morning without pain.  Urinating well. Up with therapy today, family lives at home alone but has family support. Plan for possible d/c home today pending her progress with PT.  DVT Prophylaxis - TED hose and SCDs and Eliquis Weight-Bearing as tolerated to left leg  J. Cameron Proud, PA-C [facility] Orthopaedic Surgery 08/19/2022, 7:43 AM

## 2022-08-20 DIAGNOSIS — Z471 Aftercare following joint replacement surgery: Secondary | ICD-10-CM | POA: Diagnosis not present

## 2022-08-20 DIAGNOSIS — I10 Essential (primary) hypertension: Secondary | ICD-10-CM | POA: Diagnosis not present

## 2022-08-20 DIAGNOSIS — M5416 Radiculopathy, lumbar region: Secondary | ICD-10-CM | POA: Diagnosis not present

## 2022-08-20 DIAGNOSIS — M4712 Other spondylosis with myelopathy, cervical region: Secondary | ICD-10-CM | POA: Diagnosis not present

## 2022-08-20 DIAGNOSIS — E039 Hypothyroidism, unspecified: Secondary | ICD-10-CM | POA: Diagnosis not present

## 2022-08-20 DIAGNOSIS — F3341 Major depressive disorder, recurrent, in partial remission: Secondary | ICD-10-CM | POA: Diagnosis not present

## 2022-08-20 DIAGNOSIS — M1990 Unspecified osteoarthritis, unspecified site: Secondary | ICD-10-CM | POA: Diagnosis not present

## 2022-08-20 DIAGNOSIS — M7542 Impingement syndrome of left shoulder: Secondary | ICD-10-CM | POA: Diagnosis not present

## 2022-08-20 DIAGNOSIS — M797 Fibromyalgia: Secondary | ICD-10-CM | POA: Diagnosis not present

## 2022-08-22 DIAGNOSIS — E039 Hypothyroidism, unspecified: Secondary | ICD-10-CM | POA: Diagnosis not present

## 2022-08-22 DIAGNOSIS — M797 Fibromyalgia: Secondary | ICD-10-CM | POA: Diagnosis not present

## 2022-08-22 DIAGNOSIS — M7542 Impingement syndrome of left shoulder: Secondary | ICD-10-CM | POA: Diagnosis not present

## 2022-08-22 DIAGNOSIS — Z471 Aftercare following joint replacement surgery: Secondary | ICD-10-CM | POA: Diagnosis not present

## 2022-08-22 DIAGNOSIS — I10 Essential (primary) hypertension: Secondary | ICD-10-CM | POA: Diagnosis not present

## 2022-08-22 DIAGNOSIS — M5416 Radiculopathy, lumbar region: Secondary | ICD-10-CM | POA: Diagnosis not present

## 2022-08-22 DIAGNOSIS — M1990 Unspecified osteoarthritis, unspecified site: Secondary | ICD-10-CM | POA: Diagnosis not present

## 2022-08-22 DIAGNOSIS — M4712 Other spondylosis with myelopathy, cervical region: Secondary | ICD-10-CM | POA: Diagnosis not present

## 2022-08-22 DIAGNOSIS — F3341 Major depressive disorder, recurrent, in partial remission: Secondary | ICD-10-CM | POA: Diagnosis not present

## 2022-08-24 DIAGNOSIS — M5416 Radiculopathy, lumbar region: Secondary | ICD-10-CM | POA: Diagnosis not present

## 2022-08-24 DIAGNOSIS — Z471 Aftercare following joint replacement surgery: Secondary | ICD-10-CM | POA: Diagnosis not present

## 2022-08-24 DIAGNOSIS — M797 Fibromyalgia: Secondary | ICD-10-CM | POA: Diagnosis not present

## 2022-08-24 DIAGNOSIS — M4712 Other spondylosis with myelopathy, cervical region: Secondary | ICD-10-CM | POA: Diagnosis not present

## 2022-08-24 DIAGNOSIS — M1990 Unspecified osteoarthritis, unspecified site: Secondary | ICD-10-CM | POA: Diagnosis not present

## 2022-08-24 DIAGNOSIS — I10 Essential (primary) hypertension: Secondary | ICD-10-CM | POA: Diagnosis not present

## 2022-08-24 DIAGNOSIS — E039 Hypothyroidism, unspecified: Secondary | ICD-10-CM | POA: Diagnosis not present

## 2022-08-24 DIAGNOSIS — F3341 Major depressive disorder, recurrent, in partial remission: Secondary | ICD-10-CM | POA: Diagnosis not present

## 2022-08-24 DIAGNOSIS — M7542 Impingement syndrome of left shoulder: Secondary | ICD-10-CM | POA: Diagnosis not present

## 2022-08-26 DIAGNOSIS — M797 Fibromyalgia: Secondary | ICD-10-CM | POA: Diagnosis not present

## 2022-08-26 DIAGNOSIS — I10 Essential (primary) hypertension: Secondary | ICD-10-CM | POA: Diagnosis not present

## 2022-08-26 DIAGNOSIS — E039 Hypothyroidism, unspecified: Secondary | ICD-10-CM | POA: Diagnosis not present

## 2022-08-26 DIAGNOSIS — M4712 Other spondylosis with myelopathy, cervical region: Secondary | ICD-10-CM | POA: Diagnosis not present

## 2022-08-26 DIAGNOSIS — M1990 Unspecified osteoarthritis, unspecified site: Secondary | ICD-10-CM | POA: Diagnosis not present

## 2022-08-26 DIAGNOSIS — Z471 Aftercare following joint replacement surgery: Secondary | ICD-10-CM | POA: Diagnosis not present

## 2022-08-26 DIAGNOSIS — M5416 Radiculopathy, lumbar region: Secondary | ICD-10-CM | POA: Diagnosis not present

## 2022-08-26 DIAGNOSIS — M7542 Impingement syndrome of left shoulder: Secondary | ICD-10-CM | POA: Diagnosis not present

## 2022-08-26 DIAGNOSIS — F3341 Major depressive disorder, recurrent, in partial remission: Secondary | ICD-10-CM | POA: Diagnosis not present

## 2022-08-29 DIAGNOSIS — F3341 Major depressive disorder, recurrent, in partial remission: Secondary | ICD-10-CM | POA: Diagnosis not present

## 2022-08-29 DIAGNOSIS — M1990 Unspecified osteoarthritis, unspecified site: Secondary | ICD-10-CM | POA: Diagnosis not present

## 2022-08-29 DIAGNOSIS — I10 Essential (primary) hypertension: Secondary | ICD-10-CM | POA: Diagnosis not present

## 2022-08-29 DIAGNOSIS — E039 Hypothyroidism, unspecified: Secondary | ICD-10-CM | POA: Diagnosis not present

## 2022-08-29 DIAGNOSIS — M5416 Radiculopathy, lumbar region: Secondary | ICD-10-CM | POA: Diagnosis not present

## 2022-08-29 DIAGNOSIS — M7542 Impingement syndrome of left shoulder: Secondary | ICD-10-CM | POA: Diagnosis not present

## 2022-08-29 DIAGNOSIS — M797 Fibromyalgia: Secondary | ICD-10-CM | POA: Diagnosis not present

## 2022-08-29 DIAGNOSIS — Z471 Aftercare following joint replacement surgery: Secondary | ICD-10-CM | POA: Diagnosis not present

## 2022-08-29 DIAGNOSIS — M4712 Other spondylosis with myelopathy, cervical region: Secondary | ICD-10-CM | POA: Diagnosis not present

## 2022-08-31 DIAGNOSIS — Z471 Aftercare following joint replacement surgery: Secondary | ICD-10-CM | POA: Diagnosis not present

## 2022-08-31 DIAGNOSIS — M7542 Impingement syndrome of left shoulder: Secondary | ICD-10-CM | POA: Diagnosis not present

## 2022-08-31 DIAGNOSIS — M4712 Other spondylosis with myelopathy, cervical region: Secondary | ICD-10-CM | POA: Diagnosis not present

## 2022-08-31 DIAGNOSIS — M797 Fibromyalgia: Secondary | ICD-10-CM | POA: Diagnosis not present

## 2022-08-31 DIAGNOSIS — E039 Hypothyroidism, unspecified: Secondary | ICD-10-CM | POA: Diagnosis not present

## 2022-08-31 DIAGNOSIS — F3341 Major depressive disorder, recurrent, in partial remission: Secondary | ICD-10-CM | POA: Diagnosis not present

## 2022-08-31 DIAGNOSIS — I10 Essential (primary) hypertension: Secondary | ICD-10-CM | POA: Diagnosis not present

## 2022-08-31 DIAGNOSIS — M1990 Unspecified osteoarthritis, unspecified site: Secondary | ICD-10-CM | POA: Diagnosis not present

## 2022-08-31 DIAGNOSIS — M5416 Radiculopathy, lumbar region: Secondary | ICD-10-CM | POA: Diagnosis not present

## 2022-09-01 DIAGNOSIS — M25562 Pain in left knee: Secondary | ICD-10-CM | POA: Diagnosis not present

## 2022-09-01 DIAGNOSIS — Z96652 Presence of left artificial knee joint: Secondary | ICD-10-CM | POA: Diagnosis not present

## 2022-09-01 DIAGNOSIS — G8929 Other chronic pain: Secondary | ICD-10-CM | POA: Diagnosis not present

## 2022-09-01 DIAGNOSIS — M25662 Stiffness of left knee, not elsewhere classified: Secondary | ICD-10-CM | POA: Diagnosis not present

## 2022-09-01 DIAGNOSIS — M6281 Muscle weakness (generalized): Secondary | ICD-10-CM | POA: Diagnosis not present

## 2022-09-07 DIAGNOSIS — G8929 Other chronic pain: Secondary | ICD-10-CM | POA: Diagnosis not present

## 2022-09-07 DIAGNOSIS — M25662 Stiffness of left knee, not elsewhere classified: Secondary | ICD-10-CM | POA: Diagnosis not present

## 2022-09-07 DIAGNOSIS — M25562 Pain in left knee: Secondary | ICD-10-CM | POA: Diagnosis not present

## 2022-09-07 DIAGNOSIS — Z96652 Presence of left artificial knee joint: Secondary | ICD-10-CM | POA: Diagnosis not present

## 2022-09-07 DIAGNOSIS — M6281 Muscle weakness (generalized): Secondary | ICD-10-CM | POA: Diagnosis not present

## 2022-09-14 DIAGNOSIS — M25662 Stiffness of left knee, not elsewhere classified: Secondary | ICD-10-CM | POA: Diagnosis not present

## 2022-09-14 DIAGNOSIS — M6281 Muscle weakness (generalized): Secondary | ICD-10-CM | POA: Diagnosis not present

## 2022-09-14 DIAGNOSIS — G8929 Other chronic pain: Secondary | ICD-10-CM | POA: Diagnosis not present

## 2022-09-14 DIAGNOSIS — M25562 Pain in left knee: Secondary | ICD-10-CM | POA: Diagnosis not present

## 2022-09-14 DIAGNOSIS — Z96652 Presence of left artificial knee joint: Secondary | ICD-10-CM | POA: Diagnosis not present

## 2022-09-16 DIAGNOSIS — Z96652 Presence of left artificial knee joint: Secondary | ICD-10-CM | POA: Diagnosis not present

## 2022-09-16 DIAGNOSIS — M25662 Stiffness of left knee, not elsewhere classified: Secondary | ICD-10-CM | POA: Diagnosis not present

## 2022-09-16 DIAGNOSIS — M6281 Muscle weakness (generalized): Secondary | ICD-10-CM | POA: Diagnosis not present

## 2022-09-16 DIAGNOSIS — G8929 Other chronic pain: Secondary | ICD-10-CM | POA: Diagnosis not present

## 2022-09-16 DIAGNOSIS — M25562 Pain in left knee: Secondary | ICD-10-CM | POA: Diagnosis not present

## 2022-09-19 DIAGNOSIS — M25662 Stiffness of left knee, not elsewhere classified: Secondary | ICD-10-CM | POA: Diagnosis not present

## 2022-09-19 DIAGNOSIS — Z96652 Presence of left artificial knee joint: Secondary | ICD-10-CM | POA: Diagnosis not present

## 2022-09-19 DIAGNOSIS — Z471 Aftercare following joint replacement surgery: Secondary | ICD-10-CM | POA: Diagnosis not present

## 2022-09-19 DIAGNOSIS — M25562 Pain in left knee: Secondary | ICD-10-CM | POA: Diagnosis not present

## 2022-09-19 DIAGNOSIS — M6281 Muscle weakness (generalized): Secondary | ICD-10-CM | POA: Diagnosis not present

## 2022-09-19 DIAGNOSIS — G8929 Other chronic pain: Secondary | ICD-10-CM | POA: Diagnosis not present

## 2022-09-21 DIAGNOSIS — M25562 Pain in left knee: Secondary | ICD-10-CM | POA: Diagnosis not present

## 2022-09-21 DIAGNOSIS — M6281 Muscle weakness (generalized): Secondary | ICD-10-CM | POA: Diagnosis not present

## 2022-09-21 DIAGNOSIS — M25662 Stiffness of left knee, not elsewhere classified: Secondary | ICD-10-CM | POA: Diagnosis not present

## 2022-09-21 DIAGNOSIS — Z96652 Presence of left artificial knee joint: Secondary | ICD-10-CM | POA: Diagnosis not present

## 2022-09-21 DIAGNOSIS — G8929 Other chronic pain: Secondary | ICD-10-CM | POA: Diagnosis not present

## 2022-09-26 DIAGNOSIS — M25662 Stiffness of left knee, not elsewhere classified: Secondary | ICD-10-CM | POA: Diagnosis not present

## 2022-09-26 DIAGNOSIS — Z96652 Presence of left artificial knee joint: Secondary | ICD-10-CM | POA: Diagnosis not present

## 2022-09-26 DIAGNOSIS — G8929 Other chronic pain: Secondary | ICD-10-CM | POA: Diagnosis not present

## 2022-09-26 DIAGNOSIS — M25562 Pain in left knee: Secondary | ICD-10-CM | POA: Diagnosis not present

## 2022-09-26 DIAGNOSIS — M6281 Muscle weakness (generalized): Secondary | ICD-10-CM | POA: Diagnosis not present

## 2022-09-28 DIAGNOSIS — M1712 Unilateral primary osteoarthritis, left knee: Secondary | ICD-10-CM | POA: Diagnosis not present

## 2022-09-28 DIAGNOSIS — G8929 Other chronic pain: Secondary | ICD-10-CM | POA: Diagnosis not present

## 2022-09-28 DIAGNOSIS — M25662 Stiffness of left knee, not elsewhere classified: Secondary | ICD-10-CM | POA: Diagnosis not present

## 2022-09-28 DIAGNOSIS — M6281 Muscle weakness (generalized): Secondary | ICD-10-CM | POA: Diagnosis not present

## 2022-09-28 DIAGNOSIS — Z96652 Presence of left artificial knee joint: Secondary | ICD-10-CM | POA: Diagnosis not present

## 2022-09-28 DIAGNOSIS — M25562 Pain in left knee: Secondary | ICD-10-CM | POA: Diagnosis not present

## 2022-10-05 DIAGNOSIS — Z96652 Presence of left artificial knee joint: Secondary | ICD-10-CM | POA: Diagnosis not present

## 2022-10-05 DIAGNOSIS — G8929 Other chronic pain: Secondary | ICD-10-CM | POA: Diagnosis not present

## 2022-10-05 DIAGNOSIS — M25562 Pain in left knee: Secondary | ICD-10-CM | POA: Diagnosis not present

## 2022-10-05 DIAGNOSIS — M6281 Muscle weakness (generalized): Secondary | ICD-10-CM | POA: Diagnosis not present

## 2022-10-05 DIAGNOSIS — M25662 Stiffness of left knee, not elsewhere classified: Secondary | ICD-10-CM | POA: Diagnosis not present

## 2022-10-10 DIAGNOSIS — Z96652 Presence of left artificial knee joint: Secondary | ICD-10-CM | POA: Diagnosis not present

## 2022-10-10 DIAGNOSIS — G8929 Other chronic pain: Secondary | ICD-10-CM | POA: Diagnosis not present

## 2022-10-10 DIAGNOSIS — M25562 Pain in left knee: Secondary | ICD-10-CM | POA: Diagnosis not present

## 2022-10-10 DIAGNOSIS — M6281 Muscle weakness (generalized): Secondary | ICD-10-CM | POA: Diagnosis not present

## 2022-10-10 DIAGNOSIS — M25662 Stiffness of left knee, not elsewhere classified: Secondary | ICD-10-CM | POA: Diagnosis not present

## 2022-10-12 ENCOUNTER — Other Ambulatory Visit: Payer: Self-pay | Admitting: Internal Medicine

## 2022-10-12 DIAGNOSIS — M25562 Pain in left knee: Secondary | ICD-10-CM | POA: Diagnosis not present

## 2022-10-12 DIAGNOSIS — M25662 Stiffness of left knee, not elsewhere classified: Secondary | ICD-10-CM | POA: Diagnosis not present

## 2022-10-12 DIAGNOSIS — Z96652 Presence of left artificial knee joint: Secondary | ICD-10-CM | POA: Diagnosis not present

## 2022-10-12 DIAGNOSIS — J3089 Other allergic rhinitis: Secondary | ICD-10-CM

## 2022-10-12 DIAGNOSIS — G8929 Other chronic pain: Secondary | ICD-10-CM | POA: Diagnosis not present

## 2022-10-12 DIAGNOSIS — M6281 Muscle weakness (generalized): Secondary | ICD-10-CM | POA: Diagnosis not present

## 2022-10-13 NOTE — Telephone Encounter (Signed)
Requested Prescriptions  Pending Prescriptions Disp Refills   fluticasone (FLONASE) 50 MCG/ACT nasal spray [Pharmacy Med Name: FLUTICASONE SPR 50MCG] 16 g 0    Sig: PLACE 2 SPRAYS INTO BOTH NOSTRILS DAILY     Ear, Nose, and Throat: Nasal Preparations - Corticosteroids Passed - 10/12/2022 12:13 PM      Passed - Valid encounter within last 12 months    Recent Outpatient Visits           3 months ago Essential hypertension   [place] [facility] at [facility], Jesse Sans, MD   8 months ago Annual physical exam   [place] at [facility], Jesse Sans, MD   8 months ago Hypothyroidism due to [place] at [facility], Jesse Sans, MD   1 year ago Annual physical exam   [facility] at [facility], Jesse Sans, MD   2 years ago Essential hypertension   [place] at [facility], Jesse Sans, MD       Future Appointments             In 4 months Army Melia, Jesse Sans, MD [place] at [facility]

## 2022-10-17 DIAGNOSIS — Z96652 Presence of left artificial knee joint: Secondary | ICD-10-CM | POA: Diagnosis not present

## 2022-10-17 DIAGNOSIS — M25662 Stiffness of left knee, not elsewhere classified: Secondary | ICD-10-CM | POA: Diagnosis not present

## 2022-10-17 DIAGNOSIS — M25562 Pain in left knee: Secondary | ICD-10-CM | POA: Diagnosis not present

## 2022-10-17 DIAGNOSIS — M6281 Muscle weakness (generalized): Secondary | ICD-10-CM | POA: Diagnosis not present

## 2022-10-17 DIAGNOSIS — G8929 Other chronic pain: Secondary | ICD-10-CM | POA: Diagnosis not present

## 2022-10-24 ENCOUNTER — Other Ambulatory Visit: Payer: Self-pay | Admitting: Internal Medicine

## 2022-10-24 ENCOUNTER — Encounter: Payer: Self-pay | Admitting: Internal Medicine

## 2022-10-24 DIAGNOSIS — M6281 Muscle weakness (generalized): Secondary | ICD-10-CM | POA: Diagnosis not present

## 2022-10-24 DIAGNOSIS — E038 Other specified hypothyroidism: Secondary | ICD-10-CM

## 2022-10-24 DIAGNOSIS — G8929 Other chronic pain: Secondary | ICD-10-CM | POA: Diagnosis not present

## 2022-10-24 DIAGNOSIS — Z96652 Presence of left artificial knee joint: Secondary | ICD-10-CM | POA: Diagnosis not present

## 2022-10-24 DIAGNOSIS — M25562 Pain in left knee: Secondary | ICD-10-CM | POA: Diagnosis not present

## 2022-10-24 DIAGNOSIS — M25662 Stiffness of left knee, not elsewhere classified: Secondary | ICD-10-CM | POA: Diagnosis not present

## 2022-10-24 NOTE — Telephone Encounter (Signed)
Please review.  KP

## 2022-11-01 ENCOUNTER — Other Ambulatory Visit: Payer: Self-pay | Admitting: Internal Medicine

## 2022-11-01 DIAGNOSIS — E038 Other specified hypothyroidism: Secondary | ICD-10-CM

## 2022-11-02 DIAGNOSIS — G8929 Other chronic pain: Secondary | ICD-10-CM | POA: Diagnosis not present

## 2022-11-02 DIAGNOSIS — M25562 Pain in left knee: Secondary | ICD-10-CM | POA: Diagnosis not present

## 2022-11-02 DIAGNOSIS — Z96652 Presence of left artificial knee joint: Secondary | ICD-10-CM | POA: Diagnosis not present

## 2022-11-02 DIAGNOSIS — M6281 Muscle weakness (generalized): Secondary | ICD-10-CM | POA: Diagnosis not present

## 2022-11-02 DIAGNOSIS — E063 Autoimmune thyroiditis: Secondary | ICD-10-CM | POA: Diagnosis not present

## 2022-11-02 DIAGNOSIS — E038 Other specified hypothyroidism: Secondary | ICD-10-CM | POA: Diagnosis not present

## 2022-11-02 DIAGNOSIS — M25662 Stiffness of left knee, not elsewhere classified: Secondary | ICD-10-CM | POA: Diagnosis not present

## 2022-11-02 NOTE — Telephone Encounter (Signed)
Unable to refill per protocol, Rx request is too soon. Last refill 07/12/22 for 90 and 1 refill.  Requested Prescriptions  Pending Prescriptions Disp Refills   levothyroxine (SYNTHROID) 100 MCG tablet [Pharmacy Med Name: LEVOTHYROXIN TAB 100MCG] 90 tablet 1    Sig: TAKE (1) TABLET BY MOUTH EVERY DAY     Endocrinology:  Hypothyroid Agents Passed - 11/01/2022 10:17 AM      Passed - TSH in normal range and within 360 days    TSH  Date Value Ref Range Status  07/19/2022 0.496 0.450 - 4.500 uIU/mL Final         Passed - Valid encounter within last 12 months    Recent Outpatient Visits           3 months ago Essential hypertension   [place] [facility] at [facility], Nyoka Cowden, MD   8 months ago Annual physical exam   [facility] at [facility], Nyoka Cowden, MD   9 months ago Hypothyroidism due to Hashimoto's thyroiditis   [facility] at [facility], Nyoka Cowden, MD   1 year ago Annual physical exam   [facility] at [facility], Nyoka Cowden, MD   2 years ago Essential hypertension   [facility] at [facility], Nyoka Cowden, MD       Future Appointments             In 3 months Judithann Graves, Nyoka Cowden, MD [facility] at [facility], [facility]

## 2022-11-03 LAB — TSH+T4F+T3FREE
Free T4: 1.57 ng/dL (ref 0.82–1.77)
T3, Free: 2.5 pg/mL (ref 2.0–4.4)
TSH: 0.237 u[IU]/mL — ABNORMAL LOW (ref 0.450–4.500)

## 2022-11-04 DIAGNOSIS — M6281 Muscle weakness (generalized): Secondary | ICD-10-CM | POA: Diagnosis not present

## 2022-11-04 DIAGNOSIS — M25562 Pain in left knee: Secondary | ICD-10-CM | POA: Diagnosis not present

## 2022-11-04 DIAGNOSIS — M25662 Stiffness of left knee, not elsewhere classified: Secondary | ICD-10-CM | POA: Diagnosis not present

## 2022-11-04 DIAGNOSIS — Z96652 Presence of left artificial knee joint: Secondary | ICD-10-CM | POA: Diagnosis not present

## 2022-11-04 DIAGNOSIS — G8929 Other chronic pain: Secondary | ICD-10-CM | POA: Diagnosis not present

## 2022-11-09 DIAGNOSIS — G8929 Other chronic pain: Secondary | ICD-10-CM | POA: Diagnosis not present

## 2022-11-09 DIAGNOSIS — Z96652 Presence of left artificial knee joint: Secondary | ICD-10-CM | POA: Diagnosis not present

## 2022-11-09 DIAGNOSIS — M1712 Unilateral primary osteoarthritis, left knee: Secondary | ICD-10-CM | POA: Diagnosis not present

## 2022-11-09 DIAGNOSIS — M546 Pain in thoracic spine: Secondary | ICD-10-CM | POA: Diagnosis not present

## 2022-11-09 DIAGNOSIS — M5030 Other cervical disc degeneration, unspecified cervical region: Secondary | ICD-10-CM | POA: Diagnosis not present

## 2022-11-10 ENCOUNTER — Other Ambulatory Visit: Payer: Self-pay | Admitting: Internal Medicine

## 2022-11-10 DIAGNOSIS — J3089 Other allergic rhinitis: Secondary | ICD-10-CM

## 2022-11-10 NOTE — Telephone Encounter (Signed)
Requested Prescriptions  Pending Prescriptions Disp Refills   fluticasone (FLONASE) 50 MCG/ACT nasal spray [Pharmacy Med Name: FLUTICASONE SPR 50MCG] 16 g 2    Sig: PLACE 2 SPRAYS INTO BOTH NOSTRILS DAILY     Ear, Nose, and Throat: Nasal Preparations - Corticosteroids Passed - 11/10/2022  2:38 PM      Passed - Valid encounter within last 12 months    Recent Outpatient Visits           4 months ago Essential hypertension   [facility] at [facility], Nyoka Cowden, MD   9 months ago Annual physical exam   [facility] at [facility], Nyoka Cowden, MD   9 months ago Hypothyroidism due to Hashimoto's thyroiditis   [facility] at [facility], Nyoka Cowden, MD   1 year ago Annual physical exam   [facility] at [facility], Nyoka Cowden, MD   2 years ago Essential hypertension   [facility] at [facility], Nyoka Cowden, MD       Future Appointments             In 3 months Judithann Graves, Nyoka Cowden, MD [facility] at [facility], [facility]

## 2022-11-16 DIAGNOSIS — G8929 Other chronic pain: Secondary | ICD-10-CM | POA: Diagnosis not present

## 2022-11-16 DIAGNOSIS — Z96652 Presence of left artificial knee joint: Secondary | ICD-10-CM | POA: Diagnosis not present

## 2022-11-16 DIAGNOSIS — M25562 Pain in left knee: Secondary | ICD-10-CM | POA: Diagnosis not present

## 2022-11-16 DIAGNOSIS — M6281 Muscle weakness (generalized): Secondary | ICD-10-CM | POA: Diagnosis not present

## 2022-11-16 DIAGNOSIS — M25662 Stiffness of left knee, not elsewhere classified: Secondary | ICD-10-CM | POA: Diagnosis not present

## 2022-11-23 DIAGNOSIS — G8929 Other chronic pain: Secondary | ICD-10-CM | POA: Diagnosis not present

## 2022-11-23 DIAGNOSIS — M25662 Stiffness of left knee, not elsewhere classified: Secondary | ICD-10-CM | POA: Diagnosis not present

## 2022-11-23 DIAGNOSIS — M6281 Muscle weakness (generalized): Secondary | ICD-10-CM | POA: Diagnosis not present

## 2022-11-23 DIAGNOSIS — Z96652 Presence of left artificial knee joint: Secondary | ICD-10-CM | POA: Diagnosis not present

## 2022-11-23 DIAGNOSIS — M25562 Pain in left knee: Secondary | ICD-10-CM | POA: Diagnosis not present

## 2022-11-25 DIAGNOSIS — M25662 Stiffness of left knee, not elsewhere classified: Secondary | ICD-10-CM | POA: Diagnosis not present

## 2022-11-25 DIAGNOSIS — G8929 Other chronic pain: Secondary | ICD-10-CM | POA: Diagnosis not present

## 2022-11-25 DIAGNOSIS — M6281 Muscle weakness (generalized): Secondary | ICD-10-CM | POA: Diagnosis not present

## 2022-11-25 DIAGNOSIS — M25562 Pain in left knee: Secondary | ICD-10-CM | POA: Diagnosis not present

## 2022-11-25 DIAGNOSIS — Z96652 Presence of left artificial knee joint: Secondary | ICD-10-CM | POA: Diagnosis not present

## 2022-11-29 ENCOUNTER — Ambulatory Visit (INDEPENDENT_AMBULATORY_CARE_PROVIDER_SITE_OTHER): Payer: Medicare HMO | Admitting: Internal Medicine

## 2022-11-29 VITALS — BP 128/67 | HR 83 | Ht 61.0 in | Wt 214.0 lb

## 2022-11-29 DIAGNOSIS — N390 Urinary tract infection, site not specified: Secondary | ICD-10-CM | POA: Diagnosis not present

## 2022-11-29 LAB — POCT URINALYSIS DIPSTICK
Bilirubin, UA: NEGATIVE
Blood, UA: NEGATIVE
Glucose, UA: NEGATIVE
Ketones, UA: NEGATIVE
Nitrite, UA: NEGATIVE
Protein, UA: NEGATIVE
Spec Grav, UA: 1.015 (ref 1.010–1.025)
Urobilinogen, UA: 0.2 E.U./dL
pH, UA: 6 (ref 5.0–8.0)

## 2022-11-29 MED ORDER — NITROFURANTOIN MONOHYD MACRO 100 MG PO CAPS: 100.0000 mg | ORAL_CAPSULE | Freq: Two times a day (BID) | ORAL | 0 refills | Status: AC

## 2022-11-29 NOTE — Progress Notes (Signed)
Date:  11/29/2022   Name:  Deanna Johnson   DOB:  1951-05-06   MRN:  [id number]   Chief Complaint: Urinary Tract Infection (Started 3 days ago. Pain while urinating. Frequency. )  Urinary Tract Infection  This is a new problem. The current episode started in the past 7 days. The problem has been unchanged. The quality of the pain is described as burning. The pain is mild. There has been no fever. Associated symptoms include urgency. Pertinent negatives include no chills or hematuria.    Lab Results  Component Value Date   NA 133 (L) 08/19/2022   K 4.0 08/19/2022   CO2 24 08/19/2022   GLUCOSE 137 (H) 08/19/2022   BUN 22 08/19/2022   CREATININE 0.82 08/19/2022   CALCIUM 8.5 (L) 08/19/2022   EGFR 70 07/19/2022   GFRNONAA >60 08/19/2022   Lab Results  Component Value Date   CHOL 263 (H) 02/08/2022   HDL 78 02/08/2022   LDLCALC 168 (H) 02/08/2022   TRIG 99 02/08/2022   CHOLHDL 3.4 02/08/2022   Lab Results  Component Value Date   TSH 0.237 (L) 11/02/2022   Lab Results  Component Value Date   HGBA1C 5.5 02/08/2022   Lab Results  Component Value Date   WBC 10.2 08/19/2022   HGB 11.8 (L) 08/19/2022   HCT 36.3 08/19/2022   MCV 84.6 08/19/2022   PLT 258 08/19/2022   Lab Results  Component Value Date   ALT 8 02/08/2022   AST 14 02/08/2022   ALKPHOS 67 02/08/2022   BILITOT 0.6 02/08/2022   Lab Results  Component Value Date   VD25OH 34.7 07/19/2022     Review of Systems  Constitutional:  Negative for chills, fatigue and fever.  Respiratory:  Negative for chest tightness and shortness of breath.   Cardiovascular:  Negative for chest pain.  Genitourinary:  Positive for dysuria and urgency. Negative for hematuria.  Musculoskeletal:  Positive for arthralgias and myalgias.  Psychiatric/Behavioral:  Positive for dysphoric mood. Negative for sleep disturbance. The patient is not nervous/anxious.     Patient Active Problem List   Diagnosis Date Noted   Status post  total knee replacement using cement, left 08/18/2022   Lumbar radiculitis 04/29/2020   Cervical spondylosis with myelopathy 10/25/2018   BMI 40.0-44.9, adult (HCC) 06/27/2018   Hyperlipidemia, mixed 05/05/2017   Hypothyroidism due to Hashimoto's thyroiditis 05/05/2017   Recurrent major depression in partial remission (HCC) 04/11/2016   Hx of colonic polyp 04/11/2016   Degenerative arthritis of knee 12/31/2014   Essential hypertension 12/31/2014   Impingement syndrome of left shoulder 12/16/2014   H/O fibromyalgia 08/18/2012    No Known Allergies  Past Surgical History:  Procedure Laterality Date   Anterial cervical dissectomy Fusion  02/23/2021   BREAST BIOPSY Right 1987   neg   BREAST SURGERY     RIGHT BREAST BIOPSY   BUNIONECTOMY     CATARACT EXTRACTION W/PHACO Left 01/04/2019   Procedure: CATARACT EXTRACTION PHACO AND INTRAOCULAR LENS PLACEMENT (IOC)  LEFT;  Surgeon: Nevada Crane, MD;  [facility];  Service: Ophthalmology;  Laterality: Left;   CATARACT EXTRACTION W/PHACO Right 02/01/2019   Procedure: CATARACT EXTRACTION PHACO AND INTRAOCULAR LENS PLACEMENT (IOC)  RIGHT;  Surgeon: Nevada Crane, MD;  Location: [facility];  Service: Ophthalmology;  Laterality: Right;   COLONOSCOPY  2011   benign polyps - f/u 2016   COLONOSCOPY WITH PROPOFOL N/A 07/21/2017   Procedure: COLONOSCOPY WITH PROPOFOL;  Surgeon: Christena Deem, MD;  Location: [facility];  Service: Endoscopy;  Laterality: N/A;   EYE SURGERY  June & July 2020   cataract- both eyes   FRACTURE SURGERY  1981   MAXILLARY FACIAL SURGERY   MANDIBLE FRACTURE SURGERY     jaw resection   shoulder arthroscopy Right    SHOULDER ARTHROSCOPY WITH OPEN ROTATOR CUFF REPAIR Left 01/30/2015   Procedure: SHOULDER ARTHROSCOPY WITH POSS OPEN ROTATOR CUFF REPAIR;  Surgeon: Erin Sons, MD;  Location: [facility];  Service: Orthopedics;  Laterality: Left;  Release of long head of  biceps tendon Subacromial decompression Mini open rotator cuff repair   SPINE SURGERY  February 23, 2021   ACDF surgery C4-C7   TONSILLECTOMY     TOTAL ABDOMINAL HYSTERECTOMY     TOTAL KNEE ARTHROPLASTY Left 08/18/2022   Procedure: TOTAL KNEE ARTHROPLASTY;  Surgeon: Christena Flake, MD;  Location: [facility] ORS;  Service: Orthopedics;  Laterality: Left;   TUBAL LIGATION  1978    Social History   Tobacco Use   Smoking status: Former    Packs/day: 0.25    Years: 3.00    Additional pack years: 0.00    Total pack years: 0.75    Types: Cigarettes    Quit date: 07/18/1970    Years since quitting: 52.4   Smokeless tobacco: Never   Tobacco comments:    quit at age 55  Vaping Use   Vaping Use: Never used  Substance Use Topics   Alcohol use: No   Drug use: No     Medication list has been reviewed and updated.  Current Meds  Medication Sig   acetaminophen (TYLENOL) 500 MG tablet Take 2 tablets (1,000 mg total) by mouth every 6 (six) hours.   betamethasone dipropionate 0.05 % cream Apply 1 Application topically 2 (two) times daily.   FLUoxetine (PROZAC) 20 MG capsule TAKE (1) CAPSULE BY MOUTH EVERY DAY   fluticasone (FLONASE) 50 MCG/ACT nasal spray PLACE 2 SPRAYS INTO BOTH NOSTRILS DAILY   hydrochlorothiazide (HYDRODIURIL) 25 MG tablet TAKE (1) TABLET BY MOUTH EVERY DAY   levothyroxine (SYNTHROID) 100 MCG tablet Take 1 tablet (100 mcg total) by mouth daily.   lisinopril (ZESTRIL) 20 MG tablet TAKE (1) TABLET BY MOUTH EVERY DAY   methocarbamol (ROBAXIN) 500 MG tablet Take 500 mg by mouth 4 (four) times daily.   Multiple Vitamins-Minerals (CENTRUM SILVER PO) Take by mouth.   nitrofurantoin, macrocrystal-monohydrate, (MACROBID) 100 MG capsule Take 1 capsule (100 mg total) by mouth 2 (two) times daily for 7 days.   Probiotic Product (CULTURELLE PROBIOTICS PO) Take by mouth.       11/29/2022   11:19 AM 07/12/2022    1:23 PM 02/08/2022   10:54 AM 01/19/2022   11:16 AM  GAD 7 : Generalized  Anxiety Score  Nervous, Anxious, on Edge 0 0 0 1  Control/stop worrying 0 0 0 0  Worry too much - different things 1 0 1 0  Trouble relaxing 0 0 0 0  Restless 0 0 0 0  Easily annoyed or irritable 0 0 1 1  Afraid - awful might happen 0 0 0 0  Total GAD 7 Score 1 0 2 2  Anxiety Difficulty Not difficult at all Not difficult at all Somewhat difficult Not difficult at all       11/29/2022   11:19 AM 07/12/2022    1:23 PM 04/06/2022    3:13 PM  Depression screen PHQ 2/9  Decreased Interest  2 1 0  Down, Depressed, Hopeless 1 2 0  PHQ - 2 Score 3 3 0  Altered sleeping 2 1 0  Tired, decreased energy 3 3 3   Change in appetite 0 0 0  Feeling bad or failure about yourself  0 0 0  Trouble concentrating 0 0 1  Moving slowly or fidgety/restless 0 1 0  Suicidal thoughts 0 0 0  PHQ-9 Score 8 8 4   Difficult doing work/chores Not difficult at all Somewhat difficult Not difficult at all    BP Readings from Last 3 Encounters:  11/29/22 128/67  08/19/22 (!) 142/68  08/08/22 (!) 176/73    Physical Exam Vitals and nursing note reviewed.  Constitutional:      Appearance: Normal appearance. She is well-developed.  Cardiovascular:     Rate and Rhythm: Normal rate and regular rhythm.     Heart sounds: Normal heart sounds.  Pulmonary:     Effort: Pulmonary effort is normal. No respiratory distress.     Breath sounds: Normal breath sounds.  Abdominal:     General: Bowel sounds are normal.     Palpations: Abdomen is soft.     Tenderness: There is abdominal tenderness in the suprapubic area. There is no right CVA tenderness, left CVA tenderness, guarding or rebound.  Skin:    General: Skin is warm and dry.     Wt Readings from Last 3 Encounters:  11/29/22 214 lb (97.1 kg)  08/18/22 222 lb 8 oz (100.9 kg)  08/08/22 222 lb 8 oz (100.9 kg)    BP 128/67   Pulse 83   Ht 5\' 1"  (1.549 m)   Wt 214 lb (97.1 kg)   SpO2 97%   BMI 40.43 kg/m   Assessment and Plan:  Problem List Items  Addressed This Visit   None Visit Diagnoses     Urinary tract infection without hematuria, site unspecified    -  Primary   continue to push fluids return if no improvement   Relevant Medications   nitrofurantoin, macrocrystal-monohydrate, (MACROBID) 100 MG capsule   Other Relevant Orders   POCT urinalysis dipstick (Completed)       No follow-ups on file.   Partially dictated using Dragon software, any errors are not intentional.  Reubin Milan, MD [facility] Medicine [place], [place]

## 2022-11-30 DIAGNOSIS — Z96652 Presence of left artificial knee joint: Secondary | ICD-10-CM | POA: Diagnosis not present

## 2022-11-30 DIAGNOSIS — M6281 Muscle weakness (generalized): Secondary | ICD-10-CM | POA: Diagnosis not present

## 2022-11-30 DIAGNOSIS — G8929 Other chronic pain: Secondary | ICD-10-CM | POA: Diagnosis not present

## 2022-11-30 DIAGNOSIS — M25562 Pain in left knee: Secondary | ICD-10-CM | POA: Diagnosis not present

## 2022-11-30 DIAGNOSIS — M25662 Stiffness of left knee, not elsewhere classified: Secondary | ICD-10-CM | POA: Diagnosis not present

## 2022-12-05 DIAGNOSIS — M25662 Stiffness of left knee, not elsewhere classified: Secondary | ICD-10-CM | POA: Diagnosis not present

## 2022-12-05 DIAGNOSIS — M25562 Pain in left knee: Secondary | ICD-10-CM | POA: Diagnosis not present

## 2022-12-05 DIAGNOSIS — G8929 Other chronic pain: Secondary | ICD-10-CM | POA: Diagnosis not present

## 2022-12-05 DIAGNOSIS — Z96652 Presence of left artificial knee joint: Secondary | ICD-10-CM | POA: Diagnosis not present

## 2022-12-05 DIAGNOSIS — M6281 Muscle weakness (generalized): Secondary | ICD-10-CM | POA: Diagnosis not present

## 2022-12-07 DIAGNOSIS — Z96652 Presence of left artificial knee joint: Secondary | ICD-10-CM | POA: Diagnosis not present

## 2022-12-07 DIAGNOSIS — G8929 Other chronic pain: Secondary | ICD-10-CM | POA: Diagnosis not present

## 2022-12-07 DIAGNOSIS — M25662 Stiffness of left knee, not elsewhere classified: Secondary | ICD-10-CM | POA: Diagnosis not present

## 2022-12-07 DIAGNOSIS — M6281 Muscle weakness (generalized): Secondary | ICD-10-CM | POA: Diagnosis not present

## 2022-12-07 DIAGNOSIS — M25562 Pain in left knee: Secondary | ICD-10-CM | POA: Diagnosis not present

## 2022-12-15 DIAGNOSIS — M6281 Muscle weakness (generalized): Secondary | ICD-10-CM | POA: Diagnosis not present

## 2022-12-15 DIAGNOSIS — M25662 Stiffness of left knee, not elsewhere classified: Secondary | ICD-10-CM | POA: Diagnosis not present

## 2022-12-15 DIAGNOSIS — G8929 Other chronic pain: Secondary | ICD-10-CM | POA: Diagnosis not present

## 2022-12-15 DIAGNOSIS — M25562 Pain in left knee: Secondary | ICD-10-CM | POA: Diagnosis not present

## 2022-12-15 DIAGNOSIS — Z96652 Presence of left artificial knee joint: Secondary | ICD-10-CM | POA: Diagnosis not present

## 2022-12-19 DIAGNOSIS — Z96652 Presence of left artificial knee joint: Secondary | ICD-10-CM | POA: Diagnosis not present

## 2022-12-19 DIAGNOSIS — M25662 Stiffness of left knee, not elsewhere classified: Secondary | ICD-10-CM | POA: Diagnosis not present

## 2022-12-19 DIAGNOSIS — G8929 Other chronic pain: Secondary | ICD-10-CM | POA: Diagnosis not present

## 2022-12-19 DIAGNOSIS — M6281 Muscle weakness (generalized): Secondary | ICD-10-CM | POA: Diagnosis not present

## 2022-12-19 DIAGNOSIS — M25562 Pain in left knee: Secondary | ICD-10-CM | POA: Diagnosis not present

## 2022-12-21 DIAGNOSIS — G8929 Other chronic pain: Secondary | ICD-10-CM | POA: Diagnosis not present

## 2022-12-21 DIAGNOSIS — M6281 Muscle weakness (generalized): Secondary | ICD-10-CM | POA: Diagnosis not present

## 2022-12-21 DIAGNOSIS — M25662 Stiffness of left knee, not elsewhere classified: Secondary | ICD-10-CM | POA: Diagnosis not present

## 2022-12-21 DIAGNOSIS — Z96652 Presence of left artificial knee joint: Secondary | ICD-10-CM | POA: Diagnosis not present

## 2022-12-21 DIAGNOSIS — M25562 Pain in left knee: Secondary | ICD-10-CM | POA: Diagnosis not present

## 2022-12-28 DIAGNOSIS — M6281 Muscle weakness (generalized): Secondary | ICD-10-CM | POA: Diagnosis not present

## 2022-12-28 DIAGNOSIS — Z96652 Presence of left artificial knee joint: Secondary | ICD-10-CM | POA: Diagnosis not present

## 2022-12-28 DIAGNOSIS — G8929 Other chronic pain: Secondary | ICD-10-CM | POA: Diagnosis not present

## 2022-12-28 DIAGNOSIS — M25662 Stiffness of left knee, not elsewhere classified: Secondary | ICD-10-CM | POA: Diagnosis not present

## 2022-12-28 DIAGNOSIS — M25562 Pain in left knee: Secondary | ICD-10-CM | POA: Diagnosis not present

## 2023-01-09 DIAGNOSIS — M6281 Muscle weakness (generalized): Secondary | ICD-10-CM | POA: Diagnosis not present

## 2023-01-09 DIAGNOSIS — G8929 Other chronic pain: Secondary | ICD-10-CM | POA: Diagnosis not present

## 2023-01-09 DIAGNOSIS — Z96652 Presence of left artificial knee joint: Secondary | ICD-10-CM | POA: Diagnosis not present

## 2023-01-09 DIAGNOSIS — M25562 Pain in left knee: Secondary | ICD-10-CM | POA: Diagnosis not present

## 2023-01-09 DIAGNOSIS — M25662 Stiffness of left knee, not elsewhere classified: Secondary | ICD-10-CM | POA: Diagnosis not present

## 2023-01-16 DIAGNOSIS — Z96652 Presence of left artificial knee joint: Secondary | ICD-10-CM | POA: Diagnosis not present

## 2023-01-16 DIAGNOSIS — M6281 Muscle weakness (generalized): Secondary | ICD-10-CM | POA: Diagnosis not present

## 2023-01-16 DIAGNOSIS — M25562 Pain in left knee: Secondary | ICD-10-CM | POA: Diagnosis not present

## 2023-01-16 DIAGNOSIS — G8929 Other chronic pain: Secondary | ICD-10-CM | POA: Diagnosis not present

## 2023-01-16 DIAGNOSIS — M25662 Stiffness of left knee, not elsewhere classified: Secondary | ICD-10-CM | POA: Diagnosis not present

## 2023-02-08 DIAGNOSIS — M1712 Unilateral primary osteoarthritis, left knee: Secondary | ICD-10-CM | POA: Diagnosis not present

## 2023-02-08 DIAGNOSIS — Z96652 Presence of left artificial knee joint: Secondary | ICD-10-CM | POA: Diagnosis not present

## 2023-02-10 ENCOUNTER — Encounter: Payer: Self-pay | Admitting: Internal Medicine

## 2023-02-10 ENCOUNTER — Ambulatory Visit (INDEPENDENT_AMBULATORY_CARE_PROVIDER_SITE_OTHER): Payer: Medicare HMO | Admitting: Internal Medicine

## 2023-02-10 VITALS — BP 120/68 | HR 61 | Ht 61.0 in | Wt 219.4 lb

## 2023-02-10 DIAGNOSIS — E782 Mixed hyperlipidemia: Secondary | ICD-10-CM

## 2023-02-10 DIAGNOSIS — E038 Other specified hypothyroidism: Secondary | ICD-10-CM | POA: Diagnosis not present

## 2023-02-10 DIAGNOSIS — Z6841 Body Mass Index (BMI) 40.0 and over, adult: Secondary | ICD-10-CM | POA: Diagnosis not present

## 2023-02-10 DIAGNOSIS — Z1231 Encounter for screening mammogram for malignant neoplasm of breast: Secondary | ICD-10-CM | POA: Diagnosis not present

## 2023-02-10 DIAGNOSIS — I10 Essential (primary) hypertension: Secondary | ICD-10-CM

## 2023-02-10 DIAGNOSIS — J3089 Other allergic rhinitis: Secondary | ICD-10-CM | POA: Insufficient documentation

## 2023-02-10 DIAGNOSIS — Z23 Encounter for immunization: Secondary | ICD-10-CM

## 2023-02-10 DIAGNOSIS — F3341 Major depressive disorder, recurrent, in partial remission: Secondary | ICD-10-CM | POA: Diagnosis not present

## 2023-02-10 DIAGNOSIS — Z1211 Encounter for screening for malignant neoplasm of colon: Secondary | ICD-10-CM | POA: Diagnosis not present

## 2023-02-10 DIAGNOSIS — E063 Autoimmune thyroiditis: Secondary | ICD-10-CM

## 2023-02-10 DIAGNOSIS — Z0000 Encounter for general adult medical examination without abnormal findings: Secondary | ICD-10-CM

## 2023-02-10 MED ORDER — HYDROCHLOROTHIAZIDE 25 MG PO TABS: ORAL_TABLET | ORAL | 1 refills | Status: DC

## 2023-02-10 MED ORDER — LISINOPRIL 20 MG PO TABS: ORAL_TABLET | ORAL | 1 refills | Status: DC

## 2023-02-10 MED ORDER — FLUOXETINE HCL 20 MG PO CAPS: ORAL_CAPSULE | ORAL | 3 refills | Status: DC

## 2023-02-10 MED ORDER — LEVOTHYROXINE SODIUM 100 MCG PO TABS: 100.0000 ug | ORAL_TABLET | Freq: Every day | ORAL | 1 refills | Status: DC

## 2023-02-10 MED ORDER — FLUTICASONE PROPIONATE 50 MCG/ACT NA SUSP: NASAL | 2 refills | Status: DC

## 2023-02-10 NOTE — Assessment & Plan Note (Signed)
Continue current regimen Can take an extra Zyrtec daily as needed Ear symptoms due to congestion - mild cerumen noted likely asymptomatic

## 2023-02-10 NOTE — Assessment & Plan Note (Signed)
Clinically stable on current regimen with good control of symptoms, No SI or HI. No change in management at this time. Continue Prozac

## 2023-02-10 NOTE — Assessment & Plan Note (Signed)
Normal exam with stable BP on lisinopril and hctz. No concerns or side effects to current medication. No change in regimen; continue low sodium diet.

## 2023-02-10 NOTE — Assessment & Plan Note (Signed)
Working on diet changes and exercise Some recent weight loss noted ~ 5 lb

## 2023-02-10 NOTE — Progress Notes (Signed)
Date:  02/10/2023   Name:  Deanna Johnson   DOB:  11-24-1950   MRN:  [id number]   Chief Complaint: Annual Exam Deanna Johnson is a 72 y.o. female who presents today for her Complete Annual Exam. She feels fairly well. She reports exercising. She reports she is sleeping fairly well. Breast complaints - none.  Mammogram: 07/2022 DEXA: 02/2020 Colonoscopy: 07/2017 repeat 5 yr (Dr. Marva Panda) - planned with Dr. Kathryne Hitch  Health Maintenance Due  Topic Date Due   COVID-19 Vaccine (6 - 2023-24 season) 07/18/2022   Colonoscopy  07/21/2022   Medicare Annual Wellness (AWV)  04/07/2023    Immunization History  Administered Date(s) Administered   Fluad Quad(high Dose 65+) 04/01/2019   Influenza, High Dose Seasonal PF 04/10/2017   Influenza,inj,Quad PF,6+ Mos 04/11/2016   Influenza,inj,quad, With Preservative 05/18/2018   Influenza-Unspecified 08/01/2011, 04/02/2014, 04/06/2020, 05/09/2022   Moderna Sars-Covid-2 Vaccination 11/06/2020   PFIZER(Purple Top)SARS-COV-2 Vaccination 08/11/2019, 08/31/2019, 04/17/2020, 05/16/2022   PNEUMOCOCCAL CONJUGATE-20 02/10/2023   Pneumococcal Conjugate-13 11/27/2014   Pneumococcal Polysaccharide-23 04/11/2016   Tdap 08/14/2013   Unspecified SARS-COV-2 Vaccination 05/23/2022   Zoster Recombinant(Shingrix) 10/04/2021, 03/23/2022   Zoster, Live 08/14/2013    Hypertension This is a chronic problem. The problem is controlled. Pertinent negatives include no chest pain, headaches, palpitations or shortness of breath. Past treatments include ACE inhibitors and diuretics.  Depression        This is a chronic problem.The problem is unchanged.  Associated symptoms include no fatigue and no headaches.  Past treatments include SSRIs - Selective serotonin reuptake inhibitors. Allergies - having more prolonged allergy symptoms.  Using Flonase daily with zyrtec and benadryl.  Some left ear fullness but no pain.   Lab Results  Component Value Date   NA 133 (L) 08/19/2022    K 4.0 08/19/2022   CO2 24 08/19/2022   GLUCOSE 137 (H) 08/19/2022   BUN 22 08/19/2022   CREATININE 0.82 08/19/2022   CALCIUM 8.5 (L) 08/19/2022   EGFR 70 07/19/2022   GFRNONAA >60 08/19/2022   Lab Results  Component Value Date   CHOL 263 (H) 02/08/2022   HDL 78 02/08/2022   LDLCALC 168 (H) 02/08/2022   TRIG 99 02/08/2022   CHOLHDL 3.4 02/08/2022   Lab Results  Component Value Date   TSH 0.237 (L) 11/02/2022   Lab Results  Component Value Date   HGBA1C 5.5 02/08/2022   Lab Results  Component Value Date   WBC 10.2 08/19/2022   HGB 11.8 (L) 08/19/2022   HCT 36.3 08/19/2022   MCV 84.6 08/19/2022   PLT 258 08/19/2022   Lab Results  Component Value Date   ALT 8 02/08/2022   AST 14 02/08/2022   ALKPHOS 67 02/08/2022   BILITOT 0.6 02/08/2022   Lab Results  Component Value Date   VD25OH 34.7 07/19/2022     Review of Systems  Constitutional:  Negative for chills, fatigue and fever.  HENT:  Negative for congestion, hearing loss, tinnitus, trouble swallowing and voice change.   Eyes:  Negative for visual disturbance.  Respiratory:  Negative for cough, chest tightness, shortness of breath and wheezing.   Cardiovascular:  Negative for chest pain, palpitations and leg swelling.  Gastrointestinal:  Negative for abdominal pain, constipation, diarrhea and vomiting.  Endocrine: Negative for polydipsia and polyuria.  Genitourinary:  Negative for dysuria, frequency, genital sores, vaginal bleeding and vaginal discharge.  Musculoskeletal:  Positive for arthralgias (left improving well s/p TKA). Negative for gait problem and joint  swelling.  Skin:  Negative for color change and rash.  Neurological:  Negative for dizziness, tremors, light-headedness and headaches.  Hematological:  Negative for adenopathy. Does not bruise/bleed easily.  Psychiatric/Behavioral:  Positive for depression. Negative for dysphoric mood and sleep disturbance. The patient is not nervous/anxious.      Patient Active Problem List   Diagnosis Date Noted   Environmental and seasonal allergies 02/10/2023   Status post total knee replacement using cement, left 08/18/2022   Lumbar radiculitis 04/29/2020   Cervical spondylosis with myelopathy 10/25/2018   BMI 40.0-44.9, adult (HCC) 06/27/2018   Hyperlipidemia, mixed 05/05/2017   Hypothyroidism due to Hashimoto's thyroiditis 05/05/2017   Recurrent major depression in partial remission (HCC) 04/11/2016   Hx of colonic polyp 04/11/2016   Degenerative arthritis of knee 12/31/2014   Essential hypertension 12/31/2014   Impingement syndrome of left shoulder 12/16/2014   H/O fibromyalgia 08/18/2012    No Known Allergies  Past Surgical History:  Procedure Laterality Date   Anterial cervical dissectomy Fusion  02/23/2021   BREAST BIOPSY Right 1987   neg   BREAST SURGERY     RIGHT BREAST BIOPSY   BUNIONECTOMY     CATARACT EXTRACTION W/PHACO Left 01/04/2019   Procedure: CATARACT EXTRACTION PHACO AND INTRAOCULAR LENS PLACEMENT (IOC)  LEFT;  Surgeon: Nevada Crane, MD;  Location: [facility];  Service: Ophthalmology;  Laterality: Left;   CATARACT EXTRACTION W/PHACO Right 02/01/2019   Procedure: CATARACT EXTRACTION PHACO AND INTRAOCULAR LENS PLACEMENT (IOC)  RIGHT;  Surgeon: Nevada Crane, MD;  Location: [facility];  Service: Ophthalmology;  Laterality: Right;   COLONOSCOPY  2011   benign polyps - f/u 2016   COLONOSCOPY WITH PROPOFOL N/A 07/21/2017   Procedure: COLONOSCOPY WITH PROPOFOL;  Surgeon: Christena Deem, MD;  Location: [facility];  Service: Endoscopy;  Laterality: N/A;   EYE SURGERY  June & July 2020   cataract- both eyes   FRACTURE SURGERY  1981   MAXILLARY FACIAL SURGERY   MANDIBLE FRACTURE SURGERY     jaw resection   shoulder arthroscopy Right    SHOULDER ARTHROSCOPY WITH OPEN ROTATOR CUFF REPAIR Left 01/30/2015   Procedure: SHOULDER ARTHROSCOPY WITH POSS OPEN ROTATOR CUFF REPAIR;  Surgeon:  Erin Sons, MD;  Location: [facility];  Service: Orthopedics;  Laterality: Left;  Release of long head of biceps tendon Subacromial decompression Mini open rotator cuff repair   SPINE SURGERY  February 23, 2021   ACDF surgery C4-C7   TONSILLECTOMY     TOTAL ABDOMINAL HYSTERECTOMY     TOTAL KNEE ARTHROPLASTY Left 08/18/2022   Procedure: TOTAL KNEE ARTHROPLASTY;  Surgeon: Christena Flake, MD;  Location: [facility];  Service: Orthopedics;  Laterality: Left;   TUBAL LIGATION  1978    Social History   Tobacco Use   Smoking status: Former    Current packs/day: 0.00    Average packs/day: 0.3 packs/day for 3.0 years (0.8 ttl pk-yrs)    Types: Cigarettes    Start date: 07/19/1967    Quit date: 07/18/1970    Years since quitting: 52.6   Smokeless tobacco: Never   Tobacco comments:    quit at age 29  Vaping Use   Vaping status: Never Used  Substance Use Topics   Alcohol use: No   Drug use: No     Medication list has been reviewed and updated.  Current Meds  Medication Sig   acetaminophen (TYLENOL) 500 MG tablet Take 2 tablets (1,000 mg total) by mouth  every 6 (six) hours.   betamethasone dipropionate 0.05 % cream Apply 1 Application topically 2 (two) times daily.   methocarbamol (ROBAXIN) 500 MG tablet Take 500 mg by mouth 4 (four) times daily.   Multiple Vitamins-Minerals (CENTRUM SILVER PO) Take by mouth.   Probiotic Product (CULTURELLE PROBIOTICS PO) Take by mouth.   [DISCONTINUED] FLUoxetine (PROZAC) 20 MG capsule TAKE (1) CAPSULE BY MOUTH EVERY DAY   [DISCONTINUED] fluticasone (FLONASE) 50 MCG/ACT nasal spray PLACE 2 SPRAYS INTO BOTH NOSTRILS DAILY   [DISCONTINUED] hydrochlorothiazide (HYDRODIURIL) 25 MG tablet TAKE (1) TABLET BY MOUTH EVERY DAY   [DISCONTINUED] levothyroxine (SYNTHROID) 100 MCG tablet Take 1 tablet (100 mcg total) by mouth daily.   [DISCONTINUED] lisinopril (ZESTRIL) 20 MG tablet TAKE (1) TABLET BY MOUTH EVERY DAY       02/10/2023    9:55 AM 11/29/2022    11:19 AM 07/12/2022    1:23 PM 02/08/2022   10:54 AM  GAD 7 : Generalized Anxiety Score  Nervous, Anxious, on Edge 2 0 0 0  Control/stop worrying 1 0 0 0  Worry too much - different things 1 1 0 1  Trouble relaxing 1 0 0 0  Restless 0 0 0 0  Easily annoyed or irritable 1 0 0 1  Afraid - awful might happen 1 0 0 0  Total GAD 7 Score 7 1 0 2  Anxiety Difficulty Somewhat difficult Not difficult at all Not difficult at all Somewhat difficult       02/10/2023    9:54 AM 11/29/2022   11:19 AM 07/12/2022    1:23 PM  Depression screen PHQ 2/9  Decreased Interest 2 2 1   Down, Depressed, Hopeless 2 1 2   PHQ - 2 Score 4 3 3   Altered sleeping 1 2 1   Tired, decreased energy 2 3 3   Change in appetite 1 0 0  Feeling bad or failure about yourself  0 0 0  Trouble concentrating 1 0 0  Moving slowly or fidgety/restless 0 0 1  Suicidal thoughts 0 0 0  PHQ-9 Score 9 8 8   Difficult doing work/chores Somewhat difficult Not difficult at all Somewhat difficult    BP Readings from Last 3 Encounters:  02/10/23 120/68  11/29/22 128/67  08/19/22 (!) 142/68    Physical Exam Vitals and nursing note reviewed.  Constitutional:      General: She is not in acute distress.    Appearance: She is well-developed.  HENT:     Head: Normocephalic and atraumatic.     Right Ear: Tympanic membrane and ear canal normal.     Ears:     Comments: Moderate cerumen left ear canal    Nose:     Right Sinus: No maxillary sinus tenderness.     Left Sinus: No maxillary sinus tenderness.  Eyes:     General: No scleral icterus.       Right eye: No discharge.        Left eye: No discharge.     Conjunctiva/sclera: Conjunctivae normal.  Neck:     Thyroid: No thyromegaly.     Vascular: No carotid bruit.  Cardiovascular:     Rate and Rhythm: Normal rate and regular rhythm.     Pulses: Normal pulses.     Heart sounds: Normal heart sounds.  Pulmonary:     Effort: Pulmonary effort is normal. No respiratory distress.      Breath sounds: No wheezing.  Chest:  Breasts:    Right: No mass, nipple  discharge, skin change or tenderness.     Left: No mass, nipple discharge, skin change or tenderness.  Abdominal:     General: Bowel sounds are normal.     Palpations: Abdomen is soft.     Tenderness: There is no abdominal tenderness.  Musculoskeletal:        General: No swelling.     Cervical back: Normal range of motion. No erythema.     Right lower leg: No edema.     Left lower leg: No edema.  Lymphadenopathy:     Cervical: No cervical adenopathy.  Skin:    General: Skin is warm and dry.     Capillary Refill: Capillary refill takes less than 2 seconds.     Findings: No rash.  Neurological:     General: No focal deficit present.     Mental Status: She is alert and oriented to person, place, and time.     Cranial Nerves: No cranial nerve deficit.     Sensory: No sensory deficit.     Deep Tendon Reflexes: Reflexes are normal and symmetric.  Psychiatric:        Attention and Perception: Attention normal.        Mood and Affect: Mood normal.        Behavior: Behavior normal.     Wt Readings from Last 3 Encounters:  02/10/23 219 lb 6.4 oz (99.5 kg)  11/29/22 214 lb (97.1 kg)  08/18/22 222 lb 8 oz (100.9 kg)    BP 120/68   Pulse 61   Ht 5\' 1"  (1.549 m)   Wt 219 lb 6.4 oz (99.5 kg)   SpO2 97%   BMI 41.46 kg/m   Assessment and Plan:  Problem List Items Addressed This Visit     Recurrent major depression in partial remission (HCC) (Chronic)    Clinically stable on current regimen with good control of symptoms, No SI or HI. No change in management at this time. Continue Prozac      Relevant Medications   FLUoxetine (PROZAC) 20 MG capsule   Hypothyroidism due to Hashimoto's thyroiditis (Chronic)    Stable on levothyroxine. Lab Results  Component Value Date   TSH 0.237 (L) 11/02/2022         Relevant Medications   levothyroxine (SYNTHROID) 100 MCG tablet   Other Relevant Orders    TSH+T4F+T3Free   Hyperlipidemia, mixed (Chronic)    Lipids managed with diet changes Lab Results  Component Value Date   LDLCALC 168 (H) 02/08/2022         Relevant Medications   hydrochlorothiazide (HYDRODIURIL) 25 MG tablet   lisinopril (ZESTRIL) 20 MG tablet   Other Relevant Orders   Lipid panel   Essential hypertension (Chronic)    Normal exam with stable BP on lisinopril and hctz. No concerns or side effects to current medication. No change in regimen; continue low sodium diet.       Relevant Medications   hydrochlorothiazide (HYDRODIURIL) 25 MG tablet   lisinopril (ZESTRIL) 20 MG tablet   Other Relevant Orders   CBC with Differential/Platelet   Comprehensive metabolic panel   Environmental and seasonal allergies    Continue current regimen Can take an extra Zyrtec daily as needed Ear symptoms due to congestion - mild cerumen noted likely asymptomatic      Relevant Medications   fluticasone (FLONASE) 50 MCG/ACT nasal spray   BMI 40.0-44.9, adult (HCC)    Working on diet changes and exercise Some recent weight loss noted ~  5 lb      Other Visit Diagnoses     Annual physical exam    -  Primary   continue healthy diet, exercise as able continue to work on weight loss to help with arthragias   Encounter for screening mammogram for breast cancer       Relevant Orders   MM 3D SCREENING MAMMOGRAM BILATERAL BREAST   Colon cancer screening       colonoscopy scheduled   Need for vaccination for pneumococcus       Relevant Orders   Pneumococcal conjugate vaccine 20-valent (Completed)       Return in about 6 months (around 08/13/2023) for HTN.    Reubin Milan, MD [facility] [place]

## 2023-02-10 NOTE — Assessment & Plan Note (Signed)
Lipids managed with diet changes Lab Results  Component Value Date   LDLCALC 168 (H) 02/08/2022

## 2023-02-10 NOTE — Assessment & Plan Note (Signed)
Stable on levothyroxine. Lab Results  Component Value Date   TSH 0.237 (L) 11/02/2022

## 2023-02-10 NOTE — Patient Instructions (Signed)
Call [facility] to schedule your mammogram at [phone].  

## 2023-02-22 DIAGNOSIS — E782 Mixed hyperlipidemia: Secondary | ICD-10-CM | POA: Diagnosis not present

## 2023-02-22 DIAGNOSIS — E038 Other specified hypothyroidism: Secondary | ICD-10-CM | POA: Diagnosis not present

## 2023-02-22 DIAGNOSIS — I10 Essential (primary) hypertension: Secondary | ICD-10-CM | POA: Diagnosis not present

## 2023-02-22 DIAGNOSIS — E063 Autoimmune thyroiditis: Secondary | ICD-10-CM | POA: Diagnosis not present

## 2023-02-24 ENCOUNTER — Other Ambulatory Visit: Payer: Self-pay | Admitting: Internal Medicine

## 2023-02-24 ENCOUNTER — Telehealth: Payer: Medicare HMO | Admitting: Internal Medicine

## 2023-02-24 DIAGNOSIS — E038 Other specified hypothyroidism: Secondary | ICD-10-CM

## 2023-02-24 DIAGNOSIS — E782 Mixed hyperlipidemia: Secondary | ICD-10-CM

## 2023-02-24 MED ORDER — ROSUVASTATIN CALCIUM 5 MG PO TABS: 5.0000 mg | ORAL_TABLET | Freq: Every day | ORAL | 0 refills | Status: DC

## 2023-02-24 MED ORDER — LEVOTHYROXINE SODIUM 112 MCG PO TABS: 112.0000 ug | ORAL_TABLET | Freq: Every day | ORAL | 1 refills | Status: DC

## 2023-03-07 DIAGNOSIS — Z961 Presence of intraocular lens: Secondary | ICD-10-CM | POA: Diagnosis not present

## 2023-03-07 DIAGNOSIS — Z0100 Encounter for examination of eyes and vision without abnormal findings: Secondary | ICD-10-CM | POA: Diagnosis not present

## 2023-03-07 DIAGNOSIS — H40003 Preglaucoma, unspecified, bilateral: Secondary | ICD-10-CM | POA: Diagnosis not present

## 2023-03-07 DIAGNOSIS — H26492 Other secondary cataract, left eye: Secondary | ICD-10-CM | POA: Diagnosis not present

## 2023-03-08 DIAGNOSIS — Z86010 Personal history of colonic polyps: Secondary | ICD-10-CM | POA: Diagnosis not present

## 2023-03-08 DIAGNOSIS — Z09 Encounter for follow-up examination after completed treatment for conditions other than malignant neoplasm: Secondary | ICD-10-CM | POA: Diagnosis not present

## 2023-04-12 ENCOUNTER — Ambulatory Visit: Payer: Medicare HMO

## 2023-04-12 DIAGNOSIS — Z0000 Encounter for general adult medical examination without abnormal findings: Secondary | ICD-10-CM

## 2023-04-12 NOTE — Progress Notes (Signed)
Subjective:   Deanna Johnson is a 72 y.o. female who presents for Medicare Annual (Subsequent) preventive examination.  Visit Complete: Virtual  I connected with  Deanna Johnson on 04/12/23 by a audio enabled telemedicine application and verified that I am speaking with the correct person using two identifiers.  Patient Location: Home  Provider Location: Office/Clinic  I discussed the limitations of evaluation and management by telemedicine. The patient expressed understanding and agreed to proceed.    Cardiac Risk Factors include: advanced age (>67men, >22 women);obesity (BMI >30kg/m2);hypertension;dyslipidemia     Objective:    Today's Vitals   04/12/23 1300  PainSc: 4    There is no height or weight on file to calculate BMI.     04/12/2023    1:10 PM 08/18/2022   11:00 AM 04/06/2022    3:11 PM 04/05/2021    3:12 PM 02/24/2020    9:40 AM 02/01/2019    7:09 AM 01/21/2019   12:08 PM  Advanced Directives  Does Patient Have a Medical Advance Directive? Yes Yes Yes Yes Yes Yes Yes  Type of Estate agent of [place];Living will Living will  Healthcare Power of [place];Living will Healthcare Power of [place];Living will Healthcare Power of [place];Living will Healthcare Power of [place];Living will  Does patient want to make changes to medical advance directive? No - Patient declined No - Patient declined No - Patient declined   No - Patient declined   Copy of Healthcare Power of Attorney in Chart? Yes - validated most recent copy scanned in chart (See row information)   Yes - validated most recent copy scanned in chart (See row information) Yes - validated most recent copy scanned in chart (See row information) Yes - validated most recent copy scanned in chart (See row information) No - copy requested    Current Medications (verified) Outpatient Encounter Medications as of 04/12/2023  Medication Sig   acetaminophen (TYLENOL) 500 MG tablet Take 2 tablets  (1,000 mg total) by mouth every 6 (six) hours.   betamethasone dipropionate 0.05 % cream Apply 1 Application topically 2 (two) times daily.   FLUoxetine (PROZAC) 20 MG capsule TAKE (1) CAPSULE BY MOUTH EVERY DAY   fluticasone (FLONASE) 50 MCG/ACT nasal spray PLACE 2 SPRAYS INTO BOTH NOSTRILS DAILY   hydrochlorothiazide (HYDRODIURIL) 25 MG tablet TAKE (1) TABLET BY MOUTH EVERY DAY   levothyroxine (SYNTHROID) 112 MCG tablet Take 1 tablet (112 mcg total) by mouth daily.   lisinopril (ZESTRIL) 20 MG tablet TAKE (1) TABLET BY MOUTH EVERY DAY   Multiple Vitamins-Minerals (CENTRUM SILVER PO) Take by mouth.   Probiotic Product (CULTURELLE PROBIOTICS PO) Take by mouth.   rosuvastatin (CRESTOR) 5 MG tablet Take 1 tablet (5 mg total) by mouth daily.   methocarbamol (ROBAXIN) 500 MG tablet Take 500 mg by mouth 4 (four) times daily. (Patient not taking: Reported on 04/12/2023)   No facility-administered encounter medications on file as of 04/12/2023.    Allergies (verified) Patient has no known allergies.   History: Past Medical History:  Diagnosis Date   Allergy    Arthritis    Complete tear of rotator cuff    RIGHT   Depression    Fibromyalgia    Heart murmur    slight no problems   History of tick-borne disease    Hyperlipidemia, mixed 05/05/2017   Hypertension    Hypothyroidism    Nose colonized with MRSA 08/08/2022   a.) PCR (+) prior to LEFT TKA   Shoulder  pain, left    TORN ROTATOR CUFF   Past Surgical History:  Procedure Laterality Date   Anterial cervical dissectomy Fusion  02/23/2021   BREAST BIOPSY Right 1987   neg   BREAST SURGERY     RIGHT BREAST BIOPSY   BUNIONECTOMY     CATARACT EXTRACTION W/PHACO Left 01/04/2019   Procedure: CATARACT EXTRACTION PHACO AND INTRAOCULAR LENS PLACEMENT (IOC)  LEFT;  Surgeon: Nevada Crane, MD;  Location: [facility];  Service: Ophthalmology;  Laterality: Left;   CATARACT EXTRACTION W/PHACO Right 02/01/2019   Procedure:  CATARACT EXTRACTION PHACO AND INTRAOCULAR LENS PLACEMENT (IOC)  RIGHT;  Surgeon: Nevada Crane, MD;  Location: [facility];  Service: Ophthalmology;  Laterality: Right;   COLONOSCOPY  2011   benign polyps - f/u 2016   COLONOSCOPY WITH PROPOFOL N/A 07/21/2017   Procedure: COLONOSCOPY WITH PROPOFOL;  Surgeon: Christena Deem, MD;  Location: [facility];  Service: Endoscopy;  Laterality: N/A;   EYE SURGERY  June & July 2020   cataract- both eyes   FRACTURE SURGERY  1981   MAXILLARY FACIAL SURGERY   MANDIBLE FRACTURE SURGERY     jaw resection   shoulder arthroscopy Right    SHOULDER ARTHROSCOPY WITH OPEN ROTATOR CUFF REPAIR Left 01/30/2015   Procedure: SHOULDER ARTHROSCOPY WITH POSS OPEN ROTATOR CUFF REPAIR;  Surgeon: Erin Sons, MD;  Location: [facility];  Service: Orthopedics;  Laterality: Left;  Release of long head of biceps tendon Subacromial decompression Mini open rotator cuff repair   SPINE SURGERY  February 23, 2021   ACDF surgery C4-C7   TONSILLECTOMY     TOTAL ABDOMINAL HYSTERECTOMY     TOTAL KNEE ARTHROPLASTY Left 08/18/2022   Procedure: TOTAL KNEE ARTHROPLASTY;  Surgeon: Christena Flake, MD;  Location: [facility];  Service: Orthopedics;  Laterality: Left;   TUBAL LIGATION  1978   Family History  Problem Relation Age of Onset   Breast cancer Maternal Aunt 50   Diabetes Maternal Aunt    Hypertension Mother    Bursitis Mother    Hearing loss Mother    Early death Father    Social History   Socioeconomic History   Marital status: Divorced    Spouse name: Not on file   Number of children: 1   Years of education: Not on file   Highest education level: Bachelor's degree (e.g., BA, AB, BS)  Occupational History   Occupation: retired  Tobacco Use   Smoking status: Former    Current packs/day: 0.00    Average packs/day: 0.3 packs/day for 3.0 years (0.8 ttl pk-yrs)    Types: Cigarettes    Start date: 07/19/1967    Quit date: 07/18/1970    Years  since quitting: 52.7   Smokeless tobacco: Never   Tobacco comments:    quit at age 58  Vaping Use   Vaping status: Never Used  Substance and Sexual Activity   Alcohol use: No   Drug use: No   Sexual activity: Not Currently    Birth control/protection: Post-menopausal  Other Topics Concern   Not on file  Social History Narrative   Pt lives alone.    Social Determinants of Health   Financial Resource Strain: Low Risk  (04/12/2023)   Overall Financial Resource Strain (CARDIA)    Difficulty of Paying Living Expenses: Not hard at all  Food Insecurity: No Food Insecurity (04/12/2023)   Hunger Vital Sign    Worried About Running Out of Food in the Last  Year: Never true    Ran Out of Food in the Last Year: Never true  Transportation Needs: No Transportation Needs (04/12/2023)   PRAPARE - Administrator, Civil Service (Medical): No    Lack of Transportation (Non-Medical): No  Physical Activity: Insufficiently Active (04/12/2023)   Exercise Vital Sign    Days of Exercise per Week: 3 days    Minutes of Exercise per Session: 40 min  Stress: No Stress Concern Present (04/12/2023)   [employer] of Occupational Health - Occupational Stress Questionnaire    Feeling of Stress : Only a little  Social Connections: Moderately Isolated (04/12/2023)   Social Connection and Isolation Panel [NHANES]    Frequency of Communication with Friends and Family: More than three times a week    Frequency of Social Gatherings with Friends and Family: Once a week    Attends Religious Services: Never    Database administrator or Organizations: Yes    Attends Engineer, structural: More than 4 times per year    Marital Status: Divorced    Tobacco Counseling Counseling given: Not Answered Tobacco comments: quit at age 37   Clinical Intake:  Pre-visit preparation completed: Yes  Pain : 0-10 Pain Score: 4  Pain Type: Chronic pain Pain Location: Shoulder Pain Orientation: Right,  Left Pain Descriptors / Indicators: Numbness Pain Onset: More than a month ago Pain Frequency: Intermittent     Nutritional Status: BMI > 30  Obese Nutritional Risks: None Diabetes: No  How often do you need to have someone help you when you read instructions, pamphlets, or other written materials from your doctor or pharmacy?: 1 - Never  Interpreter Needed?: No  Information entered by :: Kennedy Bucker, LPN   Activities of Daily Living    04/12/2023    1:11 PM 04/11/2023   11:01 PM  In your present state of health, do you have any difficulty performing the following activities:  Hearing? 0 0  Vision? 0 0  Difficulty concentrating or making decisions? 0 0  Walking or climbing stairs? 0 0  Dressing or bathing? 0 0  Doing errands, shopping? 0 0  Preparing Food and eating ? N N  Using the Toilet? N N  In the past six months, have you accidently leaked urine? N Y  Do you have problems with loss of bowel control? N N  Managing your Medications? N N  Managing your Finances? N N  Housekeeping or managing your Housekeeping? N N    Patient Care Team: Reubin Milan, MD as PCP - General (Internal Medicine) Merri Ray, MD as Referring Physician (Physical Medicine and Rehabilitation) Meryl Dare, MD as Referring Physician (Neurosurgery) Marikay Alar, DO (Rehabilitation)  Indicate any recent Medical Services you may have received from other than [facility] providers in the past year (date may be approximate).     Assessment:   This is a routine wellness examination for Deanna Johnson.  Hearing/Vision screen Hearing Screening - Comments:: No aids Vision Screening - Comments:: Wears readers- Dr.King   Goals Addressed             This Visit's Progress    DIET - EAT MORE FRUITS AND VEGETABLES         Depression Screen    04/12/2023    1:07 PM 02/10/2023    9:54 AM 11/29/2022   11:19 AM 07/12/2022    1:23 PM 04/06/2022    3:13 PM 02/08/2022   10:54 AM 01/19/2022  11:16 AM  PHQ 2/9 Scores  PHQ - 2 Score 0 4 3 3  0 1 3  PHQ- 9 Score 0 9 8 8 4 6 9     Fall Risk    04/12/2023    1:11 PM 04/11/2023   11:01 PM 02/10/2023    9:54 AM 11/29/2022   11:19 AM 07/12/2022    1:23 PM  Fall Risk   Falls in the past year? 0 0 0 0 0  Number falls in past yr: 0  0 0 0  Injury with Fall? 0  0 0 0  Risk for fall due to : No Fall Risks  No Fall Risks No Fall Risks No Fall Risks  Follow up Falls prevention discussed;Falls evaluation completed  Falls evaluation completed Falls evaluation completed Falls evaluation completed    MEDICARE RISK AT HOME: Medicare Risk at Home Any stairs in or around the home?: Yes If so, are there any without handrails?: No Home free of loose throw rugs in walkways, pet beds, electrical cords, etc?: Yes Adequate lighting in your home to reduce risk of falls?: Yes Life alert?: No Use of a cane, walker or w/c?: No Grab bars in the bathroom?: Yes Shower chair or bench in shower?: No Elevated toilet seat or a handicapped toilet?: No  TIMED UP AND GO:  Was the test performed?  No    Cognitive Function:        04/12/2023    1:13 PM 04/06/2022    3:13 PM 01/21/2019   12:19 PM 04/10/2017   11:28 AM 04/11/2016    8:47 AM  6CIT Screen  What Year?  0 points 0 points 0 points 0 points  What month?  0 points 0 points 0 points 0 points  What time? 0 points 0 points 0 points 0 points 0 points  Count back from 20 0 points 0 points 0 points 0 points 0 points  Months in reverse 0 points 0 points 0 points 0 points 0 points  Repeat phrase 0 points 0 points 0 points 2 points 0 points  Total Score  0 points 0 points 2 points 0 points    Immunizations Immunization History  Administered Date(s) Administered   Fluad Quad(high Dose 65+) 04/01/2019   Influenza, High Dose Seasonal PF 04/10/2017   Influenza,inj,Quad PF,6+ Mos 04/11/2016   Influenza,inj,quad, With Preservative 05/18/2018   Influenza-Unspecified 08/01/2011, 04/02/2014, 04/06/2020,  05/09/2022   Moderna Sars-Covid-2 Vaccination 11/06/2020   PFIZER(Purple Top)SARS-COV-2 Vaccination 08/11/2019, 08/31/2019, 04/17/2020, 05/16/2022   PNEUMOCOCCAL CONJUGATE-20 02/10/2023   Pneumococcal Conjugate-13 11/27/2014   Pneumococcal Polysaccharide-23 04/11/2016   Tdap 08/14/2013   Unspecified SARS-COV-2 Vaccination 05/23/2022   Zoster Recombinant(Shingrix) 10/04/2021, 03/23/2022   Zoster, Live 08/14/2013    TDAP status: Up to date  Flu Vaccine status: Up to date  Pneumococcal vaccine status: Up to date  Covid-19 vaccine status: Completed vaccines  Qualifies for Shingles Vaccine? Yes   Zostavax completed Yes   Shingrix Completed?: Yes  Screening Tests Health Maintenance  Topic Date Due   Colonoscopy  07/21/2022   INFLUENZA VACCINE  02/16/2023   COVID-19 Vaccine (6 - 2023-24 season) 03/19/2023   MAMMOGRAM  07/20/2023   DTaP/Tdap/Td (2 - Td or Tdap) 08/15/2023   Medicare Annual Wellness (AWV)  04/11/2024   Pneumonia Vaccine 55+ Years old  Completed   DEXA SCAN  Completed   Zoster Vaccines- Shingrix  Completed   Hepatitis C Screening  Addressed   HPV VACCINES  Aged Out    Health Maintenance  Health Maintenance Due  Topic Date Due   Colonoscopy  07/21/2022   INFLUENZA VACCINE  02/16/2023   COVID-19 Vaccine (6 - 2023-24 season) 03/19/2023    Colorectal cancer screening: Type of screening: Colonoscopy. Completed **HAS APPOINTMENT IN NOVEMBER**. Repeat every 5 years  Mammogram status: Completed **SCHEDULED 07/25/23**. Repeat every year  Bone Density status: Completed 03/17/20. Results reflect: Bone density results: NORMAL. Repeat every 5 years.  Lung Cancer Screening: (Low Dose CT Chest recommended if Age 43-80 years, 20 pack-year currently smoking OR have quit w/in 15years.) does not qualify.     Additional Screening:  Hepatitis C Screening: does qualify; Completed NO  Vision Screening: Recommended annual ophthalmology exams for early detection of glaucoma  and other disorders of the eye. Is the patient up to date with their annual eye exam?  Yes  Who is the provider or what is the name of the office in which the patient attends annual eye exams? Dr.King If pt is not established with a provider, would they like to be referred to a provider to establish care? No .   Dental Screening: Recommended annual dental exams for proper oral hygiene   Community Resource Referral / Chronic Care Management: CRR required this visit?  No   CCM required this visit?  No     Plan:     I have personally reviewed and noted the following in the patient's chart:   Medical and social history Use of alcohol, tobacco or illicit drugs  Current medications and supplements including opioid prescriptions. Patient is not currently taking opioid prescriptions. Functional ability and status Nutritional status Physical activity Advanced directives List of other physicians Hospitalizations, surgeries, and ER visits in previous 12 months Vitals Screenings to include cognitive, depression, and falls Referrals and appointments  In addition, I have reviewed and discussed with patient certain preventive protocols, quality metrics, and best practice recommendations. A written personalized care plan for preventive services as well as general preventive health recommendations were provided to patient.     Hal Hope, LPN   10/24/8117   After Visit Summary: (MyChart) Due to this being a telephonic visit, the after visit summary with patients personalized plan was offered to patient via MyChart   Nurse Notes: none

## 2023-04-12 NOTE — Patient Instructions (Addendum)
Deanna Johnson , Thank you for taking time to come for your Medicare Wellness Visit. I appreciate your ongoing commitment to your health goals. Please review the following plan we discussed and let me know if I can assist you in the future.   Referrals/Orders/Follow-Ups/Clinician Recommendations: none  This is a list of the screening recommended for you and due dates:  Health Maintenance  Topic Date Due   Colon Cancer Screening  07/21/2022   Flu Shot  02/16/2023   COVID-19 Vaccine (6 - 2023-24 season) 03/19/2023   Mammogram  07/20/2023   DTaP/Tdap/Td vaccine (2 - Td or Tdap) 08/15/2023   Medicare Annual Wellness Visit  04/11/2024   Pneumonia Vaccine  Completed   DEXA scan (bone density measurement)  Completed   Zoster (Shingles) Vaccine  Completed   Hepatitis C Screening  Addressed   HPV Vaccine  Aged Out    Advanced directives: (In Chart) A copy of your advanced directives are scanned into your chart should your provider ever need it.  Next Medicare Annual Wellness Visit scheduled for next year: Yes   04/17/24 @ 1:00 pm by video

## 2023-05-21 ENCOUNTER — Encounter: Payer: Self-pay | Admitting: Internal Medicine

## 2023-05-22 ENCOUNTER — Other Ambulatory Visit: Payer: Self-pay | Admitting: Internal Medicine

## 2023-05-22 DIAGNOSIS — E782 Mixed hyperlipidemia: Secondary | ICD-10-CM

## 2023-05-23 ENCOUNTER — Ambulatory Visit (INDEPENDENT_AMBULATORY_CARE_PROVIDER_SITE_OTHER): Payer: Medicare HMO | Admitting: Internal Medicine

## 2023-05-23 ENCOUNTER — Encounter: Payer: Self-pay | Admitting: *Deleted

## 2023-05-23 ENCOUNTER — Encounter: Payer: Self-pay | Admitting: Internal Medicine

## 2023-05-23 VITALS — BP 124/76 | HR 77 | Temp 97.8°F | Ht 61.0 in | Wt 222.0 lb

## 2023-05-23 DIAGNOSIS — E782 Mixed hyperlipidemia: Secondary | ICD-10-CM | POA: Diagnosis not present

## 2023-05-23 DIAGNOSIS — J0101 Acute recurrent maxillary sinusitis: Secondary | ICD-10-CM | POA: Diagnosis not present

## 2023-05-23 DIAGNOSIS — E063 Autoimmune thyroiditis: Secondary | ICD-10-CM

## 2023-05-23 MED ORDER — AMOXICILLIN-POT CLAVULANATE 875-125 MG PO TABS: 1.0000 | ORAL_TABLET | Freq: Two times a day (BID) | ORAL | 0 refills | Status: AC

## 2023-05-23 NOTE — Telephone Encounter (Signed)
Requested Prescriptions  Pending Prescriptions Disp Refills   rosuvastatin (CRESTOR) 5 MG tablet [Pharmacy Med Name: ROSUVASTATIN TAB 5MG ] 90 tablet 2    Sig: TAKE (1) TABLET BY MOUTH EVERY DAY     Cardiovascular:  Antilipid - Statins 2 Failed - 05/22/2023  9:13 AM      Failed - Lipid Panel in normal range within the last 12 months    Cholesterol, Total  Date Value Ref Range Status  02/22/2023 312 (H) 100 - 199 mg/dL Final   LDL Chol Calc (NIH)  Date Value Ref Range Status  02/22/2023 205 (H) 0 - 99 mg/dL Final   HDL  Date Value Ref Range Status  02/22/2023 75 >39 mg/dL Final   Triglycerides  Date Value Ref Range Status  02/22/2023 174 (H) 0 - 149 mg/dL Final         Passed - Cr in normal range and within 360 days    Creatinine, Ser  Date Value Ref Range Status  02/22/2023 0.91 0.57 - 1.00 mg/dL Final         Passed - Patient is not pregnant      Passed - Valid encounter within last 12 months    Recent Outpatient Visits           Today Hypothyroidism due to Hashimoto's thyroiditis   [place] [facility], Nyoka Cowden, MD   3 months ago Annual physical exam   [facility] at [facility], Nyoka Cowden, MD   5 months ago Urinary tract infection without hematuria, site unspecified   [facility] at [facility], Nyoka Cowden, MD   10 months ago Essential hypertension   [place] [facility] at [facility], Nyoka Cowden, MD   1 year ago Annual physical exam   [facility] at [facility], Nyoka Cowden, MD       Future Appointments             In 3 months Judithann Graves, Nyoka Cowden, MD [facility] at [facility], [facility]

## 2023-05-23 NOTE — Assessment & Plan Note (Signed)
LDL is  Lab Results  Component Value Date   LDLCALC 205 (H) 02/22/2023    Current regimen is Crestor - started last visit..  Tolerating medications well without issues. Fatigue felt to be due to thyroid issues.

## 2023-05-23 NOTE — Assessment & Plan Note (Addendum)
Supplemented.  Dose increased last visit but patient still feels foggy/fatigued, etc. Will check labs and increase dose if appropriate. Lab Results  Component Value Date   TSH 3.940 02/22/2023

## 2023-05-23 NOTE — Progress Notes (Signed)
Date:  05/23/2023   Name:  Deanna Johnson   DOB:  [birth year]   MRN:  [id number]   Chief Complaint: Sinusitis  Sinusitis This is a new problem. Episode onset: X2 weeks. The problem is unchanged. There has been no fever. Her pain is at a severity of 4/10. The pain is mild. Associated symptoms include congestion, coughing, headaches, sinus pressure and sneezing. Pertinent negatives include no chills or shortness of breath. Past treatments include oral decongestants and saline nose sprays. The treatment provided mild relief.  Thyroid Problem Presents for follow-up visit. Symptoms include anxiety, fatigue and hair loss. Patient reports no palpitations or weight loss. The symptoms have been stable. Her past medical history is significant for hyperlipidemia.  Hyperlipidemia This is a chronic problem. Recent lipid tests were reviewed and are high. Pertinent negatives include no chest pain or shortness of breath. Current antihyperlipidemic treatment includes statins (started in August).    Review of Systems  Constitutional:  Positive for fatigue. Negative for appetite change, chills and weight loss.  HENT:  Positive for congestion, sinus pressure and sneezing.   Respiratory:  Positive for cough. Negative for shortness of breath and wheezing.   Cardiovascular:  Negative for chest pain and palpitations.  Neurological:  Positive for headaches.  Psychiatric/Behavioral:  Positive for dysphoric mood. Negative for sleep disturbance. The patient is nervous/anxious.      Lab Results  Component Value Date   NA 142 02/22/2023   K 5.2 02/22/2023   CO2 24 02/22/2023   GLUCOSE 92 02/22/2023   BUN 23 02/22/2023   CREATININE 0.91 02/22/2023   CALCIUM 9.3 02/22/2023   EGFR 67 02/22/2023   GFRNONAA >60 08/19/2022   Lab Results  Component Value Date   CHOL 312 (H) 02/22/2023   HDL 75 02/22/2023   LDLCALC 205 (H) 02/22/2023   TRIG 174 (H) 02/22/2023   CHOLHDL 4.2 02/22/2023   Lab Results   Component Value Date   TSH 3.940 02/22/2023   Lab Results  Component Value Date   HGBA1C 5.5 02/08/2022   Lab Results  Component Value Date   WBC 5.5 02/22/2023   HGB 13.9 02/22/2023   HCT 43.2 02/22/2023   MCV 86 02/22/2023   PLT 273 02/22/2023   Lab Results  Component Value Date   ALT 11 02/22/2023   AST 16 02/22/2023   ALKPHOS 65 02/22/2023   BILITOT 0.6 02/22/2023   Lab Results  Component Value Date   VD25OH 34.7 07/19/2022     Patient Active Problem List   Diagnosis Date Noted   Environmental and seasonal allergies 02/10/2023   Status post total knee replacement using cement, left 08/18/2022   Lumbar radiculitis 04/29/2020   Cervical spondylosis with myelopathy 10/25/2018   BMI 40.0-44.9, adult (HCC) 06/27/2018   Hyperlipidemia, mixed 05/05/2017   Hypothyroidism due to Hashimoto's thyroiditis 05/05/2017   Recurrent major depression in partial remission (HCC) 04/11/2016   Hx of colonic polyp 04/11/2016   Degenerative arthritis of knee 12/31/2014   Essential hypertension 12/31/2014   Impingement syndrome of left shoulder 12/16/2014   H/O fibromyalgia 08/18/2012    No Known Allergies  Past Surgical History:  Procedure Laterality Date   Anterial cervical dissectomy Fusion  02/23/2021   BREAST BIOPSY Right 1987   neg   BREAST SURGERY     RIGHT BREAST BIOPSY   BUNIONECTOMY     CATARACT EXTRACTION W/PHACO Left 01/04/2019   Procedure: CATARACT EXTRACTION PHACO AND INTRAOCULAR LENS PLACEMENT (IOC)  LEFT;  Surgeon: Nevada Crane, MD;  Location: [facility];  Service: Ophthalmology;  Laterality: Left;   CATARACT EXTRACTION W/PHACO Right 02/01/2019   Procedure: CATARACT EXTRACTION PHACO AND INTRAOCULAR LENS PLACEMENT (IOC)  RIGHT;  Surgeon: Nevada Crane, MD;  Location: [facility];  Service: Ophthalmology;  Laterality: Right;   COLONOSCOPY  2011   benign polyps - f/u 2016   COLONOSCOPY WITH PROPOFOL N/A 07/21/2017   Procedure:  COLONOSCOPY WITH PROPOFOL;  Surgeon: Christena Deem, MD;  Location: [facility];  Service: Endoscopy;  Laterality: N/A;   EYE SURGERY  June & July 2020   cataract- both eyes   FRACTURE SURGERY  1981   MAXILLARY FACIAL SURGERY   MANDIBLE FRACTURE SURGERY     jaw resection   shoulder arthroscopy Right    SHOULDER ARTHROSCOPY WITH OPEN ROTATOR CUFF REPAIR Left 01/30/2015   Procedure: SHOULDER ARTHROSCOPY WITH POSS OPEN ROTATOR CUFF REPAIR;  Surgeon: Erin Sons, MD;  Location: [facility];  Service: Orthopedics;  Laterality: Left;  Release of long head of biceps tendon Subacromial decompression Mini open rotator cuff repair   SPINE SURGERY  February 23, 2021   ACDF surgery C4-C7   TONSILLECTOMY     TOTAL ABDOMINAL HYSTERECTOMY     TOTAL KNEE ARTHROPLASTY Left 08/18/2022   Procedure: TOTAL KNEE ARTHROPLASTY;  Surgeon: Christena Flake, MD;  Location: [facility] ORS;  Service: Orthopedics;  Laterality: Left;   TUBAL LIGATION  1978    Social History   Tobacco Use   Smoking status: Former    Current packs/day: 0.00    Average packs/day: 0.3 packs/day for 3.0 years (0.8 ttl pk-yrs)    Types: Cigarettes    Start date: 07/19/1967    Quit date: 07/18/1970    Years since quitting: 52.8   Smokeless tobacco: Never   Tobacco comments:    quit at age 48  Vaping Use   Vaping status: Never Used  Substance Use Topics   Alcohol use: No   Drug use: No     Medication list has been reviewed and updated.  Current Meds  Medication Sig   acetaminophen (TYLENOL) 500 MG tablet Take 2 tablets (1,000 mg total) by mouth every 6 (six) hours.   amoxicillin-clavulanate (AUGMENTIN) 875-125 MG tablet Take 1 tablet by mouth 2 (two) times daily for 10 days.   betamethasone dipropionate 0.05 % cream Apply 1 Application topically 2 (two) times daily.   FLUoxetine (PROZAC) 20 MG capsule TAKE (1) CAPSULE BY MOUTH EVERY DAY   fluticasone (FLONASE) 50 MCG/ACT nasal spray PLACE 2 SPRAYS INTO BOTH NOSTRILS  DAILY   hydrochlorothiazide (HYDRODIURIL) 25 MG tablet TAKE (1) TABLET BY MOUTH EVERY DAY   levothyroxine (SYNTHROID) 112 MCG tablet Take 1 tablet (112 mcg total) by mouth daily.   lisinopril (ZESTRIL) 20 MG tablet TAKE (1) TABLET BY MOUTH EVERY DAY   Multiple Vitamins-Minerals (CENTRUM SILVER PO) Take by mouth.   naproxen sodium (ALEVE) 220 MG tablet Take 220 mg by mouth.   Probiotic Product (CULTURELLE PROBIOTICS PO) Take by mouth.   rosuvastatin (CRESTOR) 5 MG tablet Take 1 tablet (5 mg total) by mouth daily.   [DISCONTINUED] methocarbamol (ROBAXIN) 500 MG tablet Take 500 mg by mouth 4 (four) times daily.       05/23/2023    9:02 AM 02/10/2023    9:55 AM 11/29/2022   11:19 AM 07/12/2022    1:23 PM  GAD 7 : Generalized Anxiety Score  Nervous, Anxious, on Edge 2 2 0  0  Control/stop worrying 0 1 0 0  Worry too much - different things 1 1 1  0  Trouble relaxing 2 1 0 0  Restless 0 0 0 0  Easily annoyed or irritable 0 1 0 0  Afraid - awful might happen 0 1 0 0  Total GAD 7 Score 5 7 1  0  Anxiety Difficulty Somewhat difficult Somewhat difficult Not difficult at all Not difficult at all       05/23/2023    9:01 AM 04/12/2023    1:07 PM 02/10/2023    9:54 AM  Depression screen PHQ 2/9  Decreased Interest 2 0 2  Down, Depressed, Hopeless 2 0 2  PHQ - 2 Score 4 0 4  Altered sleeping 3 0 1  Tired, decreased energy 3 0 2  Change in appetite 0 0 1  Feeling bad or failure about yourself  0 0 0  Trouble concentrating 2 0 1  Moving slowly or fidgety/restless 3 0 0  Suicidal thoughts 0 0 0  PHQ-9 Score 15 0 9  Difficult doing work/chores Somewhat difficult Not difficult at all Somewhat difficult    BP Readings from Last 3 Encounters:  05/23/23 124/76  02/10/23 120/68  11/29/22 128/67    Physical Exam Vitals and nursing note reviewed.  Constitutional:      General: She is not in acute distress.    Appearance: Normal appearance. She is well-developed.  HENT:     Head:  Normocephalic and atraumatic.     Right Ear: Tympanic membrane normal.     Nose:     Right Sinus: Maxillary sinus tenderness and frontal sinus tenderness present.     Left Sinus: Maxillary sinus tenderness and frontal sinus tenderness present.     Mouth/Throat:     Mouth: Mucous membranes are moist.     Pharynx: Posterior oropharyngeal erythema present. No oropharyngeal exudate.  Neck:     Vascular: No carotid bruit.  Cardiovascular:     Rate and Rhythm: Normal rate and regular rhythm.  Pulmonary:     Effort: Pulmonary effort is normal. No respiratory distress.     Breath sounds: No wheezing or rhonchi.  Musculoskeletal:     Cervical back: Normal range of motion.  Lymphadenopathy:     Cervical: No cervical adenopathy.  Skin:    General: Skin is warm and dry.     Findings: No rash.  Neurological:     Mental Status: She is alert and oriented to person, place, and time.  Psychiatric:        Mood and Affect: Mood normal.        Behavior: Behavior normal.     Wt Readings from Last 3 Encounters:  05/23/23 222 lb (100.7 kg)  02/10/23 219 lb 6.4 oz (99.5 kg)  11/29/22 214 lb (97.1 kg)    BP 124/76 (BP Location: Left Arm, Cuff Size: Large)   Pulse 77   Temp 97.8 F (36.6 C) (Oral)   Ht 5\' 1"  (1.549 m)   Wt 222 lb (100.7 kg)   SpO2 96%   BMI 41.95 kg/m   Assessment and Plan:  Problem List Items Addressed This Visit       Unprioritized   Hyperlipidemia, mixed (Chronic)    LDL is  Lab Results  Component Value Date   LDLCALC 205 (H) 02/22/2023    Current regimen is Crestor - started last visit..  Tolerating medications well without issues. Fatigue felt to be due to thyroid issues.  Relevant Orders   Lipid panel   Comprehensive metabolic panel   Hypothyroidism due to Hashimoto's thyroiditis - Primary (Chronic)    Supplemented.  Dose increased last visit but patient still feels foggy/fatigued, etc. Will check labs and increase dose if appropriate. Lab  Results  Component Value Date   TSH 3.940 02/22/2023         Relevant Orders   TSH+T4F+T3Free   Other Visit Diagnoses     Acute recurrent maxillary sinusitis       Relevant Medications   amoxicillin-clavulanate (AUGMENTIN) 875-125 MG tablet       No follow-ups on file.    Reubin Milan, MD [facility] [place]

## 2023-05-24 LAB — COMPREHENSIVE METABOLIC PANEL
ALT: 11 [IU]/L (ref 0–32)
AST: 14 [IU]/L (ref 0–40)
Albumin: 4 g/dL (ref 3.8–4.8)
Alkaline Phosphatase: 59 [IU]/L (ref 44–121)
BUN/Creatinine Ratio: 26 (ref 12–28)
BUN: 23 mg/dL (ref 8–27)
Bilirubin Total: 0.7 mg/dL (ref 0.0–1.2)
CO2: 25 mmol/L (ref 20–29)
Calcium: 9.4 mg/dL (ref 8.7–10.3)
Chloride: 105 mmol/L (ref 96–106)
Creatinine, Ser: 0.89 mg/dL (ref 0.57–1.00)
Globulin, Total: 2.3 g/dL (ref 1.5–4.5)
Glucose: 93 mg/dL (ref 70–99)
Potassium: 4.4 mmol/L (ref 3.5–5.2)
Sodium: 142 mmol/L (ref 134–144)
Total Protein: 6.3 g/dL (ref 6.0–8.5)
eGFR: 68 mL/min/{1.73_m2} (ref >59)

## 2023-05-24 LAB — LIPID PANEL
Chol/HDL Ratio: 2.7 ratio (ref 0.0–4.4)
Cholesterol, Total: 176 mg/dL (ref 100–199)
HDL: 65 mg/dL (ref >39)
LDL Chol Calc (NIH): 87 mg/dL (ref 0–99)
Triglycerides: 139 mg/dL (ref 0–149)
VLDL Cholesterol Cal: 24 mg/dL (ref 5–40)

## 2023-05-24 LAB — TSH+T4F+T3FREE
Free T4: 1.78 ng/dL — ABNORMAL HIGH (ref 0.82–1.77)
T3, Free: 3 pg/mL (ref 2.0–4.4)
TSH: 0.063 u[IU]/mL — ABNORMAL LOW (ref 0.450–4.500)

## 2023-05-26 ENCOUNTER — Telehealth: Payer: Self-pay | Admitting: Internal Medicine

## 2023-05-26 NOTE — Telephone Encounter (Signed)
Patient called sttd she is in agreement with the labs and will see provider next year. Did not need P[facility] nurse to go over results with her

## 2023-06-02 ENCOUNTER — Other Ambulatory Visit: Payer: Self-pay | Admitting: Medical Genetics

## 2023-06-02 DIAGNOSIS — Z006 Encounter for examination for normal comparison and control in clinical research program: Secondary | ICD-10-CM

## 2023-06-05 ENCOUNTER — Ambulatory Visit: Payer: Medicare HMO

## 2023-06-05 ENCOUNTER — Encounter
Admission: RE | Disposition: A | Discharge status: Home / Self Care | Payer: Self-pay | Source: Home / Self Care | Attending: Gastroenterology

## 2023-06-05 ENCOUNTER — Ambulatory Visit
Admission: RE | Admit: 2023-06-05 | Discharge: 2023-06-05 | Disposition: A | Discharge status: Home / Self Care | Payer: Medicare HMO | Attending: Gastroenterology | Admitting: Gastroenterology

## 2023-06-05 DIAGNOSIS — M1990 Unspecified osteoarthritis, unspecified site: Secondary | ICD-10-CM | POA: Diagnosis not present

## 2023-06-05 DIAGNOSIS — Z09 Encounter for follow-up examination after completed treatment for conditions other than malignant neoplasm: Secondary | ICD-10-CM | POA: Diagnosis not present

## 2023-06-05 DIAGNOSIS — G709 Myoneural disorder, unspecified: Secondary | ICD-10-CM | POA: Insufficient documentation

## 2023-06-05 DIAGNOSIS — Z860101 Personal history of adenomatous and serrated colon polyps: Secondary | ICD-10-CM | POA: Diagnosis not present

## 2023-06-05 DIAGNOSIS — E785 Hyperlipidemia, unspecified: Secondary | ICD-10-CM | POA: Insufficient documentation

## 2023-06-05 DIAGNOSIS — E039 Hypothyroidism, unspecified: Secondary | ICD-10-CM | POA: Diagnosis not present

## 2023-06-05 DIAGNOSIS — K640 First degree hemorrhoids: Secondary | ICD-10-CM | POA: Insufficient documentation

## 2023-06-05 DIAGNOSIS — Z79899 Other long term (current) drug therapy: Secondary | ICD-10-CM | POA: Insufficient documentation

## 2023-06-05 DIAGNOSIS — Z90710 Acquired absence of both cervix and uterus: Secondary | ICD-10-CM | POA: Insufficient documentation

## 2023-06-05 DIAGNOSIS — F32A Depression, unspecified: Secondary | ICD-10-CM | POA: Insufficient documentation

## 2023-06-05 DIAGNOSIS — K649 Unspecified hemorrhoids: Secondary | ICD-10-CM | POA: Diagnosis not present

## 2023-06-05 DIAGNOSIS — K5730 Diverticulosis of large intestine without perforation or abscess without bleeding: Secondary | ICD-10-CM | POA: Insufficient documentation

## 2023-06-05 DIAGNOSIS — E782 Mixed hyperlipidemia: Secondary | ICD-10-CM | POA: Diagnosis not present

## 2023-06-05 DIAGNOSIS — E66813 Obesity, class 3: Secondary | ICD-10-CM | POA: Diagnosis not present

## 2023-06-05 DIAGNOSIS — M797 Fibromyalgia: Secondary | ICD-10-CM | POA: Diagnosis not present

## 2023-06-05 DIAGNOSIS — Z87891 Personal history of nicotine dependence: Secondary | ICD-10-CM | POA: Diagnosis not present

## 2023-06-05 DIAGNOSIS — Z1211 Encounter for screening for malignant neoplasm of colon: Secondary | ICD-10-CM | POA: Diagnosis not present

## 2023-06-05 DIAGNOSIS — I10 Essential (primary) hypertension: Secondary | ICD-10-CM | POA: Diagnosis not present

## 2023-06-05 HISTORY — PX: COLONOSCOPY WITH PROPOFOL: SHX5780

## 2023-06-05 SURGERY — COLONOSCOPY WITH PROPOFOL
Anesthesia: General

## 2023-06-05 MED ORDER — PROPOFOL 500 MG/50ML IV EMUL
INTRAVENOUS | Status: DC | PRN
  Administered 2023-06-05: 100 ug/kg/min via INTRAVENOUS

## 2023-06-05 MED ORDER — LIDOCAINE HCL (CARDIAC) PF 100 MG/5ML IV SOSY
PREFILLED_SYRINGE | INTRAVENOUS | Status: DC | PRN
  Administered 2023-06-05: 50 mg via INTRAVENOUS

## 2023-06-05 MED ORDER — PROPOFOL 10 MG/ML IV BOLUS
INTRAVENOUS | Status: AC
  Filled 2023-06-05: qty 40

## 2023-06-05 MED ORDER — SODIUM CHLORIDE 0.9 % IV SOLN: INTRAVENOUS | Status: DC

## 2023-06-05 MED ORDER — PROPOFOL 10 MG/ML IV BOLUS
INTRAVENOUS | Status: DC | PRN
  Administered 2023-06-05: 20 mg via INTRAVENOUS
  Administered 2023-06-05: 50 mg via INTRAVENOUS
  Administered 2023-06-05: 30 mg via INTRAVENOUS

## 2023-06-05 MED ORDER — LIDOCAINE HCL (PF) 2 % IJ SOLN
INTRAMUSCULAR | Status: AC
  Filled 2023-06-05: qty 5

## 2023-06-05 NOTE — Interval H&P Note (Signed)
History and Physical Interval Note:  06/05/2023 8:50 AM  Deanna Johnson  has presented today for surgery, with the diagnosis of hx of adenomatous polyp of colon.  The various methods of treatment have been discussed with the patient and family. After consideration of risks, benefits and other options for treatment, the patient has consented to  Procedure(s): COLONOSCOPY WITH PROPOFOL (N/A) as a surgical intervention.  The patient's history has been reviewed, patient examined, no change in status, stable for surgery.  I have reviewed the patient's chart and labs.  Questions were answered to the patient's satisfaction.     Regis Bill  Ok to proceed with colonoscopy

## 2023-06-05 NOTE — Anesthesia Preprocedure Evaluation (Addendum)
Anesthesia Evaluation  Patient identified by MRN, date of birth, ID band Patient awake    Reviewed: Allergy & Precautions, NPO status , Patient's Chart, lab work & pertinent test results  History of Anesthesia Complications Negative for: history of anesthetic complications  Airway Mallampati: III  TM Distance: >3 FB Neck ROM: full    Dental  (+) Chipped   Pulmonary former smoker   Pulmonary exam normal        Cardiovascular hypertension, On Medications Normal cardiovascular exam     Neuro/Psych  PSYCHIATRIC DISORDERS  Depression     Neuromuscular disease    GI/Hepatic negative GI ROS, Neg liver ROS,,,  Endo/Other  Hypothyroidism  Class 3 obesity  Renal/GU negative Renal ROS  negative genitourinary   Musculoskeletal  (+) Arthritis ,  Fibromyalgia -  Abdominal   Peds  Hematology negative hematology ROS (+)   Anesthesia Other Findings Past Medical History: No date: Allergy No date: Arthritis No date: Complete tear of rotator cuff     Comment:  RIGHT No date: Depression No date: Fibromyalgia No date: Heart murmur     Comment:  slight no problems No date: History of tick-borne disease 05/05/2017: Hyperlipidemia, mixed No date: Hypertension No date: Hypothyroidism 08/08/2022: Nose colonized with MRSA     Comment:  a.) PCR (+) prior to LEFT TKA No date: Shoulder pain, left     Comment:  TORN ROTATOR CUFF  Past Surgical History: 02/23/2021: Anterial cervical dissectomy Fusion 1987: BREAST BIOPSY; Right     Comment:  neg No date: BREAST SURGERY     Comment:  RIGHT BREAST BIOPSY No date: BUNIONECTOMY 01/04/2019: CATARACT EXTRACTION W/PHACO; Left     Comment:  Procedure: CATARACT EXTRACTION PHACO AND INTRAOCULAR               LENS PLACEMENT (IOC)  LEFT;  Surgeon: Nevada Crane,              MD;  Location: [facility];  Service:               Ophthalmology;  Laterality: Left; 02/01/2019:  CATARACT EXTRACTION W/PHACO; Right     Comment:  Procedure: CATARACT EXTRACTION PHACO AND INTRAOCULAR               LENS PLACEMENT (IOC)  RIGHT;  Surgeon: Nevada Crane, MD;  Location: [facility];  Service:               Ophthalmology;  Laterality: Right; 2011: COLONOSCOPY     Comment:  benign polyps - f/u 2016 07/21/2017: COLONOSCOPY WITH PROPOFOL; N/A     Comment:  Procedure: COLONOSCOPY WITH PROPOFOL;  Surgeon:               Christena Deem, MD;  Location: [facility] END[facility];                Service: Endoscopy;  Laterality: N/A; June & July 2020: EYE SURGERY     Comment:  cataract- both eyes 1981: FRACTURE SURGERY     Comment:  MAXILLARY FACIAL SURGERY No date: MANDIBLE FRACTURE SURGERY     Comment:  jaw resection No date: shoulder arthroscopy; Right 01/30/2015: SHOULDER ARTHROSCOPY WITH OPEN ROTATOR CUFF REPAIR; Left     Comment:  Procedure: SHOULDER ARTHROSCOPY WITH POSS OPEN ROTATOR               CUFF REPAIR;  Surgeon: Erin Sons,  MD;  Location:               [facility];  Service: Orthopedics;  Laterality:               Left;  Release of long head of biceps tendon Subacromial              decompression Mini open rotator cuff repair February 23, 2021: SPINE SURGERY     Comment:  ACDF surgery C4-C7 No date: TONSILLECTOMY No date: TOTAL ABDOMINAL HYSTERECTOMY 08/18/2022: TOTAL KNEE ARTHROPLASTY; Left     Comment:  Procedure: TOTAL KNEE ARTHROPLASTY;  Surgeon: Christena Flake, MD;  Location: [facility] ORS;  Service: Orthopedics;                Laterality: Left; 1978: TUBAL LIGATION     Reproductive/Obstetrics negative OB ROS                             Anesthesia Physical Anesthesia Plan  ASA: 3  Anesthesia Plan: General   Post-op Pain Management: Minimal or no pain anticipated   Induction: Intravenous  PONV Risk Score and Plan: 2 and Propofol infusion and TIVA  Airway Management Planned: Natural  Airway and Nasal Cannula  Additional Equipment:   Intra-op Plan:   Post-operative Plan:   Informed Consent: I have reviewed the patients History and Physical, chart, labs and discussed the procedure including the risks, benefits and alternatives for the proposed anesthesia with the patient or authorized representative who has indicated his/her understanding and acceptance.     Dental Advisory Given  Plan Discussed with: Anesthesiologist, CRNA and Surgeon  Anesthesia Plan Comments: (Patient consented for risks of anesthesia including but not limited to:  - adverse reactions to medications - risk of airway placement if required - damage to eyes, teeth, lips or other oral mucosa - nerve damage due to positioning  - sore throat or hoarseness - Damage to heart, brain, nerves, lungs, other parts of body or loss of life  Patient voiced understanding and assent.)       Anesthesia Quick Evaluation

## 2023-06-05 NOTE — Anesthesia Postprocedure Evaluation (Signed)
Anesthesia Post Note  Patient: Deanna Johnson  Procedure(s) Performed: COLONOSCOPY WITH PROPOFOL  Patient location during evaluation: Endoscopy Anesthesia Type: General Level of consciousness: awake and alert Pain management: pain level controlled Vital Signs Assessment: post-procedure vital signs reviewed and stable Respiratory status: spontaneous breathing, nonlabored ventilation, respiratory function stable and patient connected to nasal cannula oxygen Cardiovascular status: blood pressure returned to baseline and stable Postop Assessment: no apparent nausea or vomiting Anesthetic complications: no   No notable events documented.   Last Vitals:  Vitals:   06/05/23 0916 06/05/23 0926  BP: (!) 100/48 136/61  Pulse: 80 78  Resp: 19   Temp: (!) 36.1 C   SpO2: 100% 99%    Last Pain:  Vitals:   06/05/23 0926  TempSrc:   PainSc: 0-No pain                 Louie Boston

## 2023-06-05 NOTE — H&P (Signed)
Outpatient short stay form Pre-procedure 06/05/2023  Deanna Bill, MD  Primary Physician: Reubin Milan, MD  Reason for visit:  Surveillance colonoscopy  History of present illness:    72 y/o lady with history of hypothyroidism, hypertension, and HLD here for surveillance colonoscopy. Last colonoscopy in 2019 with three Ta's. No blood thinners. No family history of GI malignancies. History of hysterectomy.    Current Facility-Administered Medications:    0.9 %  sodium chloride infusion, , Intravenous, Continuous, Oda Lansdowne, Rossie Muskrat, MD, Last Rate: 20 mL/hr at 06/05/23 0827, New Bag at 06/05/23 0827  Medications Prior to Admission  Medication Sig Dispense Refill Last Dose   levothyroxine (SYNTHROID) 112 MCG tablet Take 1 tablet (112 mcg total) by mouth daily. 100 tablet 1 06/04/2023   lisinopril (ZESTRIL) 20 MG tablet TAKE (1) TABLET BY MOUTH EVERY DAY 90 tablet 1 06/04/2023   acetaminophen (TYLENOL) 500 MG tablet Take 2 tablets (1,000 mg total) by mouth every 6 (six) hours. 60 tablet 0    betamethasone dipropionate 0.05 % cream Apply 1 Application topically 2 (two) times daily.      FLUoxetine (PROZAC) 20 MG capsule TAKE (1) CAPSULE BY MOUTH EVERY DAY 90 capsule 3    fluticasone (FLONASE) 50 MCG/ACT nasal spray PLACE 2 SPRAYS INTO BOTH NOSTRILS DAILY 16 g 2    hydrochlorothiazide (HYDRODIURIL) 25 MG tablet TAKE (1) TABLET BY MOUTH EVERY DAY 90 tablet 1    Multiple Vitamins-Minerals (CENTRUM SILVER PO) Take by mouth.      naproxen sodium (ALEVE) 220 MG tablet Take 220 mg by mouth.      Probiotic Product (CULTURELLE PROBIOTICS PO) Take by mouth.      rosuvastatin (CRESTOR) 5 MG tablet TAKE (1) TABLET BY MOUTH EVERY DAY 90 tablet 2      No Known Allergies   Past Medical History:  Diagnosis Date   Allergy    Arthritis    Complete tear of rotator cuff    RIGHT   Depression    Fibromyalgia    Heart murmur    slight no problems   History of tick-borne disease     Hyperlipidemia, mixed 05/05/2017   Hypertension    Hypothyroidism    Nose colonized with MRSA 08/08/2022   a.) PCR (+) prior to LEFT TKA   Shoulder pain, left    TORN ROTATOR CUFF    Review of systems:  Otherwise negative.    Physical Exam  Gen: Alert, oriented. Appears stated age.  HEENT: PERRLA. Lungs: No respiratory distress CV: RRR Abd: soft, benign, no masses Ext: No edema    Planned procedures: Proceed with colonoscopy. The patient understands the nature of the planned procedure, indications, risks, alternatives and potential complications including but not limited to bleeding, infection, perforation, damage to internal organs and possible oversedation/side effects from anesthesia. The patient agrees and gives consent to proceed.  Please refer to procedure notes for findings, recommendations and patient disposition/instructions.     Deanna Bill, MD [facility]

## 2023-06-05 NOTE — Op Note (Signed)
[facility] Patient Name: Deanna Johnson Procedure Date: 06/05/2023 8:46 AM MRN: [id number] Account #: [id number] Date of Birth: January 21, 1951 Admit Type: Outpatient Age: 72 Room: [facility] ENDO ROOM 3 Gender: Female Note Status: Finalized Instrument Name: Prentice Docker [id number] Procedure:             Colonoscopy Indications:           Surveillance: Personal history of adenomatous polyps                         on last colonoscopy 5 years ago Providers:             Eather Colas MD, MD Referring MD:          Bari Edward, MD (Referring MD) Medicines:             Monitored Anesthesia Care Complications:         No immediate complications. Procedure:             Pre-Anesthesia Assessment:                        - Prior to the procedure, a History and Physical was                         performed, and patient medications and allergies were                         reviewed. The patient is competent. The risks and                         benefits of the procedure and the sedation options and                         risks were discussed with the patient. All questions                         were answered and informed consent was obtained.                         Patient identification and proposed procedure were                         verified by the physician, the nurse, the                         anesthesiologist, the anesthetist and the technician                         in the endoscopy suite. Mental Status Examination:                         alert and oriented. Airway Examination: normal                         oropharyngeal airway and neck mobility. Respiratory                         Examination: clear to auscultation. CV Examination:  normal. Prophylactic Antibiotics: The patient does not                         require prophylactic antibiotics. Prior                         Anticoagulants: The patient has taken no  anticoagulant                         or antiplatelet agents. ASA Grade Assessment: III - A                         patient with severe systemic disease. After reviewing                         the risks and benefits, the patient was deemed in                         satisfactory condition to undergo the procedure. The                         anesthesia plan was to use monitored anesthesia care                         (MAC). Immediately prior to administration of                         medications, the patient was re-assessed for adequacy                         to receive sedatives. The heart rate, respiratory                         rate, oxygen saturations, blood pressure, adequacy of                         pulmonary ventilation, and response to care were                         monitored throughout the procedure. The physical                         status of the patient was re-assessed after the                         procedure.                        After obtaining informed consent, the colonoscope was                         passed under direct vision. Throughout the procedure,                         the patient's blood pressure, pulse, and oxygen                         saturations were monitored continuously. The  Colonoscope was introduced through the anus and                         advanced to the the cecum, identified by appendiceal                         orifice and ileocecal valve. The colonoscopy was                         performed without difficulty. The patient tolerated                         the procedure well. The quality of the bowel                         preparation was good. The ileocecal valve, appendiceal                         orifice, and rectum were photographed. Findings:      The perianal and digital rectal examinations were normal.      A few small-mouthed diverticula were found in the sigmoid colon.      Internal  hemorrhoids were found during retroflexion. The hemorrhoids       were Grade I (internal hemorrhoids that do not prolapse).      The exam was otherwise without abnormality on direct and retroflexion       views. Impression:            - Diverticulosis in the sigmoid colon.                        - Internal hemorrhoids.                        - The examination was otherwise normal on direct and                         retroflexion views.                        - No specimens collected. Recommendation:        - Discharge patient to home.                        - Resume previous diet.                        - Continue present medications.                        - Repeat colonoscopy is not recommended due to current                         age (11 years or older) for surveillance.                        - Return to referring physician as previously                         scheduled. Procedure Code(s):     --- Professional ---  G0105, Colorectal cancer screening; colonoscopy on                         individual at high risk Diagnosis Code(s):     --- Professional ---                        Z86.010, Personal history of colonic polyps                        K64.0, First degree hemorrhoids                        K57.30, Diverticulosis of large intestine without                         perforation or abscess without bleeding CPT copyright 2022 American Medical Association. All rights reserved. The codes documented in this report are preliminary and upon coder review may  be revised to meet current compliance requirements. Eather Colas MD, MD 06/05/2023 9:16:17 AM Number of Addenda: 0 Note Initiated On: 06/05/2023 8:46 AM Scope Withdrawal Time: 0 hours 9 minutes 19 seconds  Total Procedure Duration: 0 hours 17 minutes 3 seconds  Estimated Blood Loss:  Estimated blood loss: none.      [facility]

## 2023-06-05 NOTE — Transfer of Care (Signed)
Immediate Anesthesia Transfer of Care Note  Patient: Deanna Johnson  Procedure(s) Performed: COLONOSCOPY WITH PROPOFOL  Patient Location: PACU  Anesthesia Type:MAC  Level of Consciousness: awake  Airway & Oxygen Therapy: Patient Spontanous Breathing  Post-op Assessment: Report given to RN and Post -op Vital signs reviewed and stable  Post vital signs: Reviewed and stable  Last Vitals:  Vitals Value Taken Time  BP 100/48 06/05/23 0916  Temp 36.1 C 06/05/23 0916  Pulse 78 06/05/23 0916  Resp 21 06/05/23 0916  SpO2 100 % 06/05/23 0916  Vitals shown include unfiled device data.  Last Pain:  Vitals:   06/05/23 0916  TempSrc: Temporal  PainSc: 0-No pain         Complications: No notable events documented.

## 2023-06-05 NOTE — Anesthesia Procedure Notes (Signed)
Procedure Name: MAC Date/Time: 06/05/2023 8:52 AM  Performed by: Elisabeth Pigeon, CRNAPre-anesthesia Checklist: Patient identified, Suction available, Patient being monitored, Timeout performed and Emergency Drugs available Patient Re-evaluated:Patient Re-evaluated prior to induction Oxygen Delivery Method: Simple face mask

## 2023-06-06 ENCOUNTER — Encounter: Payer: Self-pay | Admitting: Gastroenterology

## 2023-06-07 ENCOUNTER — Other Ambulatory Visit: Payer: Self-pay

## 2023-06-12 ENCOUNTER — Other Ambulatory Visit: Payer: Self-pay

## 2023-06-20 ENCOUNTER — Other Ambulatory Visit: Payer: Self-pay

## 2023-06-21 ENCOUNTER — Other Ambulatory Visit: Payer: Self-pay

## 2023-06-22 ENCOUNTER — Other Ambulatory Visit
Admission: RE | Admit: 2023-06-22 | Discharge: 2023-06-22 | Disposition: A | Discharge status: Home / Self Care | Payer: Self-pay | Source: Ambulatory Visit | Attending: Medical Genetics | Admitting: Medical Genetics

## 2023-06-22 DIAGNOSIS — Z006 Encounter for examination for normal comparison and control in clinical research program: Secondary | ICD-10-CM | POA: Insufficient documentation

## 2023-07-03 LAB — GENECONNECT MOLECULAR SCREEN: Genetic Analysis Overall Interpretation: NEGATIVE

## 2023-07-08 ENCOUNTER — Encounter: Payer: Self-pay | Admitting: Internal Medicine

## 2023-07-08 ENCOUNTER — Other Ambulatory Visit: Payer: Self-pay | Admitting: Internal Medicine

## 2023-07-08 DIAGNOSIS — J3089 Other allergic rhinitis: Secondary | ICD-10-CM

## 2023-07-08 MED ORDER — FLUTICASONE PROPIONATE 50 MCG/ACT NA SUSP: NASAL | 2 refills | Status: DC

## 2023-07-25 ENCOUNTER — Ambulatory Visit
Admission: RE | Admit: 2023-07-25 | Discharge: 2023-07-25 | Disposition: A | Discharge status: Home / Self Care | Payer: Medicare HMO | Source: Ambulatory Visit | Attending: Internal Medicine | Admitting: Internal Medicine

## 2023-07-25 DIAGNOSIS — Z1231 Encounter for screening mammogram for malignant neoplasm of breast: Secondary | ICD-10-CM | POA: Diagnosis not present

## 2023-07-25 LAB — HM MAMMOGRAPHY

## 2023-08-24 ENCOUNTER — Ambulatory Visit (INDEPENDENT_AMBULATORY_CARE_PROVIDER_SITE_OTHER): Payer: Medicare HMO | Admitting: Internal Medicine

## 2023-08-24 ENCOUNTER — Encounter: Payer: Self-pay | Admitting: Internal Medicine

## 2023-08-24 VITALS — BP 128/74 | HR 85 | Ht 61.0 in | Wt 225.0 lb

## 2023-08-24 DIAGNOSIS — Z6841 Body Mass Index (BMI) 40.0 and over, adult: Secondary | ICD-10-CM | POA: Diagnosis not present

## 2023-08-24 DIAGNOSIS — F3341 Major depressive disorder, recurrent, in partial remission: Secondary | ICD-10-CM

## 2023-08-24 DIAGNOSIS — E063 Autoimmune thyroiditis: Secondary | ICD-10-CM

## 2023-08-24 DIAGNOSIS — I10 Essential (primary) hypertension: Secondary | ICD-10-CM

## 2023-08-24 MED ORDER — FLUOXETINE HCL 40 MG PO CAPS: 40.0000 mg | ORAL_CAPSULE | Freq: Every day | ORAL | 3 refills | Status: DC

## 2023-08-24 NOTE — Assessment & Plan Note (Addendum)
 Clinically worse due to multiple factors (knee and back pain, myalgias, chronic fatigue, weight gain and politics) with fair response, No SI or HI reported. On Prozac  only.  Recommend adding an atypical such as Abilify but she would like to increase Prozac  dose first. Increase Prozac  to 40 mg. Follow up in 6 weeks.

## 2023-08-24 NOTE — Progress Notes (Signed)
 Date:  08/24/2023   Name:  Deanna Johnson   DOB:  1950/10/16   MRN:  [id number]   Chief Complaint: Hypertension, Depression, Hypothyroidism, and Hyperlipidemia  Hypertension This is a chronic problem. The problem is controlled. Pertinent negatives include no chest pain, headaches or shortness of breath. Identifiable causes of hypertension include a thyroid  problem.  Depression        This is a chronic problem.  The problem occurs daily.  The problem has been gradually worsening since onset.  Associated symptoms include fatigue and myalgias.  Associated symptoms include no appetite change and no headaches.  Past treatments include SSRIs - Selective serotonin reuptake inhibitors.  Past medical history includes thyroid  problem.   Hyperlipidemia This is a chronic problem. Associated symptoms include myalgias. Pertinent negatives include no chest pain or shortness of breath.  Thyroid  Problem Presents for follow-up visit. Symptoms include anxiety and fatigue. Patient reports no constipation, diaphoresis or diarrhea. The symptoms have been stable. Her past medical history is significant for hyperlipidemia.    Review of Systems  Constitutional:  Positive for fatigue and unexpected weight change. Negative for appetite change, diaphoresis and fever.  HENT:  Negative for trouble swallowing.   Respiratory:  Negative for chest tightness and shortness of breath.   Cardiovascular:  Negative for chest pain and leg swelling.  Gastrointestinal:  Negative for abdominal pain, constipation and diarrhea.  Musculoskeletal:  Positive for arthralgias, gait problem and myalgias.  Neurological:  Negative for dizziness and headaches.  Psychiatric/Behavioral:  Positive for depression, dysphoric mood and sleep disturbance. The patient is nervous/anxious.      Lab Results  Component Value Date   NA 142 05/23/2023   K 4.4 05/23/2023   CO2 25 05/23/2023   GLUCOSE 93 05/23/2023   BUN 23 05/23/2023   CREATININE  0.89 05/23/2023   CALCIUM  9.4 05/23/2023   EGFR 68 05/23/2023   GFRNONAA >60 08/19/2022   Lab Results  Component Value Date   CHOL 176 05/23/2023   HDL 65 05/23/2023   LDLCALC 87 05/23/2023   TRIG 139 05/23/2023   CHOLHDL 2.7 05/23/2023   Lab Results  Component Value Date   TSH 0.063 (L) 05/23/2023   Lab Results  Component Value Date   HGBA1C 5.5 02/08/2022   Lab Results  Component Value Date   WBC 5.5 02/22/2023   HGB 13.9 02/22/2023   HCT 43.2 02/22/2023   MCV 86 02/22/2023   PLT 273 02/22/2023   Lab Results  Component Value Date   ALT 11 05/23/2023   AST 14 05/23/2023   ALKPHOS 59 05/23/2023   BILITOT 0.7 05/23/2023   Lab Results  Component Value Date   VD25OH 34.7 07/19/2022     Patient Active Problem List   Diagnosis Date Noted   Environmental and seasonal allergies 02/10/2023   Status post total knee replacement using cement, left 08/18/2022   Lumbar radiculitis 04/29/2020   Cervical spondylosis with myelopathy 10/25/2018   BMI 40.0-44.9, adult (HCC) 06/27/2018   Hyperlipidemia, mixed 05/05/2017   Hypothyroidism due to Hashimoto's thyroiditis 05/05/2017   Recurrent major depression in partial remission (HCC) 04/11/2016   Hx of colonic polyp 04/11/2016   Degenerative arthritis of knee 12/31/2014   Essential hypertension 12/31/2014   Impingement syndrome of left shoulder 12/16/2014   H/O fibromyalgia 08/18/2012    No Known Allergies  Past Surgical History:  Procedure Laterality Date   Anterial cervical dissectomy Fusion  02/23/2021   BREAST BIOPSY Right 1987   neg  BREAST SURGERY     RIGHT BREAST BIOPSY   BUNIONECTOMY     CATARACT EXTRACTION W/PHACO Left 01/04/2019   Procedure: CATARACT EXTRACTION PHACO AND INTRAOCULAR LENS PLACEMENT (IOC)  LEFT;  Surgeon: Myrna Adine Anes, MD;  Location: [facility];  Service: Ophthalmology;  Laterality: Left;   CATARACT EXTRACTION W/PHACO Right 02/01/2019   Procedure: CATARACT EXTRACTION PHACO  AND INTRAOCULAR LENS PLACEMENT (IOC)  RIGHT;  Surgeon: Myrna Adine Anes, MD;  Location: [facility];  Service: Ophthalmology;  Laterality: Right;   COLONOSCOPY  2011   benign polyps - f/u 2016   COLONOSCOPY WITH PROPOFOL  N/A 07/21/2017   Procedure: COLONOSCOPY WITH PROPOFOL ;  Surgeon: Gaylyn Gladis PENNER, MD;  Location: [facility] END[facility];  Service: Endoscopy;  Laterality: N/A;   COLONOSCOPY WITH PROPOFOL  N/A 06/05/2023   Procedure: COLONOSCOPY WITH PROPOFOL ;  Surgeon: Maryruth Ole DASEN, MD;  Location: [facility] ENDOSCOPY;  Service: Endoscopy;  Laterality: N/A;   EYE SURGERY  June & July 2020   cataract- both eyes   FRACTURE SURGERY  1981   MAXILLARY FACIAL SURGERY   MANDIBLE FRACTURE SURGERY     jaw resection   shoulder arthroscopy Right    SHOULDER ARTHROSCOPY WITH OPEN ROTATOR CUFF REPAIR Left 01/30/2015   Procedure: SHOULDER ARTHROSCOPY WITH POSS OPEN ROTATOR CUFF REPAIR;  Surgeon: Helayne Glenn, MD;  Location: [facility];  Service: Orthopedics;  Laterality: Left;  Release of long head of biceps tendon Subacromial decompression Mini open rotator cuff repair   SPINE SURGERY  February 23, 2021   ACDF surgery C4-C7   TONSILLECTOMY     TOTAL ABDOMINAL HYSTERECTOMY     TOTAL KNEE ARTHROPLASTY Left 08/18/2022   Procedure: TOTAL KNEE ARTHROPLASTY;  Surgeon: Edie Norleen PARAS, MD;  Location: [facility] ORS;  Service: Orthopedics;  Laterality: Left;   TUBAL LIGATION  1978    Social History   Tobacco Use   Smoking status: Former    Current packs/day: 0.00    Average packs/day: 0.3 packs/day for 3.0 years (0.8 ttl pk-yrs)    Types: Cigarettes    Start date: 07/19/1967    Quit date: 07/18/1970    Years since quitting: 53.1   Smokeless tobacco: Never   Tobacco comments:    quit at age 54  Vaping Use   Vaping status: Never Used  Substance Use Topics   Alcohol use: No   Drug use: No     Medication list has been reviewed and updated.  Current Meds  Medication Sig   acetaminophen   (TYLENOL ) 500 MG tablet Take 2 tablets (1,000 mg total) by mouth every 6 (six) hours.   FLUoxetine  (PROZAC ) 40 MG capsule Take 1 capsule (40 mg total) by mouth daily.   fluticasone  (FLONASE ) 50 MCG/ACT nasal spray PLACE 2 SPRAYS INTO BOTH NOSTRILS DAILY   hydrochlorothiazide  (HYDRODIURIL ) 25 MG tablet TAKE (1) TABLET BY MOUTH EVERY DAY   lisinopril  (ZESTRIL ) 20 MG tablet TAKE (1) TABLET BY MOUTH EVERY DAY   Multiple Vitamins-Minerals (CENTRUM SILVER PO) Take by mouth.   naproxen sodium (ALEVE) 220 MG tablet Take 220 mg by mouth.   Probiotic Product (CULTURELLE PROBIOTICS PO) Take by mouth.   rosuvastatin  (CRESTOR ) 5 MG tablet TAKE (1) TABLET BY MOUTH EVERY DAY   [DISCONTINUED] FLUoxetine  (PROZAC ) 20 MG capsule TAKE (1) CAPSULE BY MOUTH EVERY DAY   [DISCONTINUED] levothyroxine  (SYNTHROID ) 112 MCG tablet Take 1 tablet (112 mcg total) by mouth daily.       08/24/2023   10:10 AM 05/23/2023  9:02 AM 02/10/2023    9:55 AM 11/29/2022   11:19 AM  GAD 7 : Generalized Anxiety Score  Nervous, Anxious, on Edge 2 2 2  0  Control/stop worrying 2 0 1 0  Worry too much - different things 2 1 1 1   Trouble relaxing 2 2 1  0  Restless 0 0 0 0  Easily annoyed or irritable 0 0 1 0  Afraid - awful might happen 0 0 1 0  Total GAD 7 Score 8 5 7 1   Anxiety Difficulty Very difficult Somewhat difficult Somewhat difficult Not difficult at all       08/24/2023   10:09 AM 05/23/2023    9:01 AM 04/12/2023    1:07 PM  Depression screen PHQ 2/9  Decreased Interest 3 2 0  Down, Depressed, Hopeless 3 2 0  PHQ - 2 Score 6 4 0  Altered sleeping 3 3 0  Tired, decreased energy 3 3 0  Change in appetite 2 0 0  Feeling bad or failure about yourself  0 0 0  Trouble concentrating 2 2 0  Moving slowly or fidgety/restless 0 3 0  Suicidal thoughts 2 0 0  PHQ-9 Score 18 15 0  Difficult doing work/chores Very difficult Somewhat difficult Not difficult at all    BP Readings from Last 3 Encounters:  08/24/23 128/74   06/05/23 136/61  05/23/23 124/76    Physical Exam Vitals and nursing note reviewed.  Constitutional:      General: She is not in acute distress.    Appearance: Normal appearance. She is well-developed. She is obese.  HENT:     Head: Normocephalic and atraumatic.  Cardiovascular:     Rate and Rhythm: Normal rate and regular rhythm.  Pulmonary:     Effort: Pulmonary effort is normal. No respiratory distress.     Breath sounds: No wheezing or rhonchi.  Musculoskeletal:     Cervical back: Normal range of motion.     Right lower leg: No edema.     Left lower leg: No edema.  Skin:    General: Skin is warm and dry.     Findings: No rash.  Neurological:     General: No focal deficit present.     Mental Status: She is alert and oriented to person, place, and time.  Psychiatric:        Attention and Perception: Attention normal.        Mood and Affect: Mood is depressed.        Speech: Speech normal.        Behavior: Behavior normal.        Thought Content: Thought content does not include suicidal plan.        Cognition and Memory: Cognition normal.     Wt Readings from Last 3 Encounters:  08/24/23 225 lb (102.1 kg)  06/05/23 220 lb (99.8 kg)  05/23/23 222 lb (100.7 kg)    BP 128/74   Pulse 85   Ht 5' 1 (1.549 m)   Wt 225 lb (102.1 kg)   SpO2 100%   BMI 42.51 kg/m   Assessment and Plan:  Problem List Items Addressed This Visit       Unprioritized   Essential hypertension - Primary (Chronic)   Controlled BP with normal exam. Current regimen is lisinopril  and hctz. Will continue same medications; encourage continued reduced sodium diet.       Relevant Orders   Comprehensive metabolic panel   Recurrent major depression in partial  remission (HCC) (Chronic)   Clinically worse due to multiple factors (knee and back pain, myalgias, chronic fatigue, weight gain and politics) with fair response, No SI or HI reported. On Prozac  only.  Recommend adding an atypical  such as Abilify but she would like to increase Prozac  dose first. Increase Prozac  to 40 mg. Follow up in 6 weeks.        Relevant Medications   FLUoxetine  (PROZAC ) 40 MG capsule   Hypothyroidism due to Hashimoto's thyroiditis (Chronic)   Still struggling to get regulated.  Will recheck labs and then try Euthyrox .       Relevant Orders   TSH+T4F+T3Free    Return in about 6 weeks (around 10/05/2023) for Depression.    Leita HILARIO Adie, MD [facility] [place]

## 2023-08-24 NOTE — Assessment & Plan Note (Signed)
 Controlled BP with normal exam. Current regimen is lisinopril and hctz. Will continue same medications; encourage continued reduced sodium diet.

## 2023-08-24 NOTE — Assessment & Plan Note (Addendum)
 Still struggling to get regulated.  Will recheck labs and then try Euthyrox .

## 2023-08-25 ENCOUNTER — Encounter: Payer: Self-pay | Admitting: Internal Medicine

## 2023-08-25 ENCOUNTER — Other Ambulatory Visit: Payer: Self-pay | Admitting: Internal Medicine

## 2023-08-25 DIAGNOSIS — E063 Autoimmune thyroiditis: Secondary | ICD-10-CM

## 2023-08-25 LAB — COMPREHENSIVE METABOLIC PANEL
ALT: 9 [IU]/L (ref 0–32)
AST: 13 [IU]/L (ref 0–40)
Albumin: 4 g/dL (ref 3.8–4.8)
Alkaline Phosphatase: 62 [IU]/L (ref 44–121)
BUN/Creatinine Ratio: 21 (ref 12–28)
BUN: 16 mg/dL (ref 8–27)
Bilirubin Total: 0.6 mg/dL (ref 0.0–1.2)
CO2: 25 mmol/L (ref 20–29)
Calcium: 9.3 mg/dL (ref 8.7–10.3)
Chloride: 103 mmol/L (ref 96–106)
Creatinine, Ser: 0.78 mg/dL (ref 0.57–1.00)
Globulin, Total: 2.4 g/dL (ref 1.5–4.5)
Glucose: 91 mg/dL (ref 70–99)
Potassium: 4.1 mmol/L (ref 3.5–5.2)
Sodium: 144 mmol/L (ref 134–144)
Total Protein: 6.4 g/dL (ref 6.0–8.5)
eGFR: 80 mL/min/{1.73_m2} (ref >59)

## 2023-08-25 LAB — TSH+T4F+T3FREE
Free T4: 1.69 ng/dL (ref 0.82–1.77)
T3, Free: 2.7 pg/mL (ref 2.0–4.4)
TSH: 0.139 u[IU]/mL — ABNORMAL LOW (ref 0.450–4.500)

## 2023-08-25 MED ORDER — EUTHYROX 125 MCG PO TABS: 125.0000 ug | ORAL_TABLET | Freq: Every day | ORAL | 1 refills | Status: DC

## 2023-09-05 ENCOUNTER — Other Ambulatory Visit: Payer: Self-pay | Admitting: Internal Medicine

## 2023-09-05 DIAGNOSIS — I10 Essential (primary) hypertension: Secondary | ICD-10-CM

## 2023-09-06 NOTE — Telephone Encounter (Signed)
Requested Prescriptions  Pending Prescriptions Disp Refills   lisinopril (ZESTRIL) 20 MG tablet [Pharmacy Med Name: LISINOPRIL 20MG  TABLET] 90 tablet 1    Sig: TAKE (1) TABLET BY MOUTH EVERY DAY     Cardiovascular:  ACE Inhibitors Passed - 09/06/2023  1:11 PM      Passed - Cr in normal range and within 180 days    Creatinine, Ser  Date Value Ref Range Status  08/24/2023 0.78 0.57 - 1.00 mg/dL Final         Passed - K in normal range and within 180 days    Potassium  Date Value Ref Range Status  08/24/2023 4.1 3.5 - 5.2 mmol/L Final         Passed - Patient is not pregnant      Passed - Last BP in normal range    BP Readings from Last 1 Encounters:  08/24/23 128/74         Passed - Valid encounter within last 6 months    Recent Outpatient Visits           3 months ago Hypothyroidism due to Hashimoto's thyroiditis   [place] [facility] at [facility], Nyoka Cowden, MD   6 months ago Annual physical exam   [facility] at [facility], Nyoka Cowden, MD   9 months ago Urinary tract infection without hematuria, site unspecified   [facility] at [facility], Nyoka Cowden, MD   1 year ago Essential hypertension   [place] [facility] at [facility], Nyoka Cowden, MD   1 year ago Annual physical exam   [facility] at [facility], Nyoka Cowden, MD       Future Appointments             In 1 month Judithann Graves, Nyoka Cowden, MD [facility] at [facility], [facility]

## 2023-10-06 ENCOUNTER — Ambulatory Visit: Payer: Medicare HMO | Admitting: Internal Medicine

## 2023-10-09 ENCOUNTER — Ambulatory Visit: Admitting: Internal Medicine

## 2023-10-13 ENCOUNTER — Ambulatory Visit: Admitting: Internal Medicine

## 2023-10-17 ENCOUNTER — Encounter: Payer: Self-pay | Admitting: Internal Medicine

## 2023-10-17 ENCOUNTER — Ambulatory Visit (INDEPENDENT_AMBULATORY_CARE_PROVIDER_SITE_OTHER): Admitting: Internal Medicine

## 2023-10-17 VITALS — BP 122/74 | HR 90 | Ht 61.0 in | Wt 225.1 lb

## 2023-10-17 DIAGNOSIS — M1712 Unilateral primary osteoarthritis, left knee: Secondary | ICD-10-CM

## 2023-10-17 DIAGNOSIS — Z6841 Body Mass Index (BMI) 40.0 and over, adult: Secondary | ICD-10-CM

## 2023-10-17 DIAGNOSIS — F3341 Major depressive disorder, recurrent, in partial remission: Secondary | ICD-10-CM | POA: Diagnosis not present

## 2023-10-17 DIAGNOSIS — E063 Autoimmune thyroiditis: Secondary | ICD-10-CM | POA: Diagnosis not present

## 2023-10-17 MED ORDER — TIRZEPATIDE-WEIGHT MANAGEMENT 2.5 MG/0.5ML ~~LOC~~ SOLN: 2.5000 mg | SUBCUTANEOUS | 0 refills | Status: DC

## 2023-10-17 NOTE — Progress Notes (Signed)
 Date:  10/17/2023   Name:  Deanna Johnson   DOB:  05-14-1951   MRN:  [id number]   Chief Complaint: Depression and Hypothyroidism  Depression        This is a chronic problem.  The problem has been gradually improving since onset.  Associated symptoms include sad.  Associated symptoms include no fatigue, no hopelessness, does not have insomnia, no restlessness, no decreased interest and no headaches.  Past treatments include SSRIs - Selective serotonin reuptake inhibitors.  Past medical history includes thyroid problem.   Thyroid Problem Presents for follow-up visit. Symptoms include leg swelling. Patient reports no cold intolerance, constipation, diarrhea, fatigue or hair loss. The symptoms have been improving.  Obesity - she has struggled her whole life with this.  She would like to try GLP-1 medications - preferably zepbound.  Review of Systems  Constitutional:  Negative for fatigue.  Respiratory:  Negative for chest tightness and shortness of breath.   Cardiovascular:  Negative for chest pain.  Gastrointestinal:  Negative for constipation and diarrhea.  Endocrine: Negative for cold intolerance.  Neurological:  Negative for dizziness and headaches.  Psychiatric/Behavioral:  Positive for depression. The patient does not have insomnia.      Lab Results  Component Value Date   NA 144 08/24/2023   K 4.1 08/24/2023   CO2 25 08/24/2023   GLUCOSE 91 08/24/2023   BUN 16 08/24/2023   CREATININE 0.78 08/24/2023   CALCIUM 9.3 08/24/2023   EGFR 80 08/24/2023   GFRNONAA >60 08/19/2022   Lab Results  Component Value Date   CHOL 176 05/23/2023   HDL 65 05/23/2023   LDLCALC 87 05/23/2023   TRIG 139 05/23/2023   CHOLHDL 2.7 05/23/2023   Lab Results  Component Value Date   TSH 0.139 (L) 08/24/2023   Lab Results  Component Value Date   HGBA1C 5.5 02/08/2022   Lab Results  Component Value Date   WBC 5.5 02/22/2023   HGB 13.9 02/22/2023   HCT 43.2 02/22/2023   MCV 86 02/22/2023    PLT 273 02/22/2023   Lab Results  Component Value Date   ALT 9 08/24/2023   AST 13 08/24/2023   ALKPHOS 62 08/24/2023   BILITOT 0.6 08/24/2023   Lab Results  Component Value Date   VD25OH 34.7 07/19/2022     Patient Active Problem List   Diagnosis Date Noted   Environmental and seasonal allergies 02/10/2023   Status post total knee replacement using cement, left 08/18/2022   Lumbar radiculitis 04/29/2020   Cervical spondylosis with myelopathy 10/25/2018   BMI 40.0-44.9, adult (HCC) 06/27/2018   Hyperlipidemia, mixed 05/05/2017   Hypothyroidism due to Hashimoto's thyroiditis 05/05/2017   Recurrent major depression in partial remission (HCC) 04/11/2016   Hx of colonic polyp 04/11/2016   Degenerative arthritis of knee 12/31/2014   Essential hypertension 12/31/2014   Impingement syndrome of left shoulder 12/16/2014   H/O fibromyalgia 08/18/2012    No Known Allergies  Past Surgical History:  Procedure Laterality Date   Anterial cervical dissectomy Fusion  02/23/2021   BREAST BIOPSY Right 1987   neg   BREAST SURGERY     RIGHT BREAST BIOPSY   BUNIONECTOMY     CATARACT EXTRACTION W/PHACO Left 01/04/2019   Procedure: CATARACT EXTRACTION PHACO AND INTRAOCULAR LENS PLACEMENT (IOC)  LEFT;  Surgeon: Nevada Crane, MD;  Location: [facility];  Service: Ophthalmology;  Laterality: Left;   CATARACT EXTRACTION W/PHACO Right 02/01/2019   Procedure: CATARACT EXTRACTION PHACO  AND INTRAOCULAR LENS PLACEMENT (IOC)  RIGHT;  Surgeon: Nevada Crane, MD;  Location: [facility];  Service: Ophthalmology;  Laterality: Right;   COLONOSCOPY  2011   benign polyps - f/u 2016   COLONOSCOPY WITH PROPOFOL N/A 07/21/2017   Procedure: COLONOSCOPY WITH PROPOFOL;  Surgeon: Christena Deem, MD;  Location[facility] ENDOSCOPY;  Service: Endoscopy;  Laterality: N/A;   COLONOSCOPY WITH PROPOFOL N/A 06/05/2023   Procedure: COLONOSCOPY WITH PROPOFOL;  Surgeon: Regis Bill, MD;   Location: [facility] ENDOSCOPY;  Service: Endoscopy;  Laterality: N/A;   EYE SURGERY  June & July 2020   cataract- both eyes   FRACTURE SURGERY  1981   MAXILLARY FACIAL SURGERY   MANDIBLE FRACTURE SURGERY     jaw resection   shoulder arthroscopy Right    SHOULDER ARTHROSCOPY WITH OPEN ROTATOR CUFF REPAIR Left 01/30/2015   Procedure: SHOULDER ARTHROSCOPY WITH POSS OPEN ROTATOR CUFF REPAIR;  Surgeon: Erin Sons, MD;  Location: [facility];  Service: Orthopedics;  Laterality: Left;  Release of long head of biceps tendon Subacromial decompression Mini open rotator cuff repair   SPINE SURGERY  February 23, 2021   ACDF surgery C4-C7   TONSILLECTOMY     TOTAL ABDOMINAL HYSTERECTOMY     TOTAL KNEE ARTHROPLASTY Left 08/18/2022   Procedure: TOTAL KNEE ARTHROPLASTY;  Surgeon: Christena Flake, MD;  Location: [facility] ORS;  Service: Orthopedics;  Laterality: Left;   TUBAL LIGATION  1978    Social History   Tobacco Use   Smoking status: Former    Current packs/day: 0.00    Average packs/day: 0.3 packs/day for 3.0 years (0.8 ttl pk-yrs)    Types: Cigarettes    Start date: 07/19/1967    Quit date: 07/18/1970    Years since quitting: 53.2   Smokeless tobacco: Never   Tobacco comments:    quit at age 31  Vaping Use   Vaping status: Never Used  Substance Use Topics   Alcohol use: No   Drug use: No     Medication list has been reviewed and updated.  Current Meds  Medication Sig   acetaminophen (TYLENOL) 500 MG tablet Take 2 tablets (1,000 mg total) by mouth every 6 (six) hours.   EUTHYROX 125 MCG tablet Take 1 tablet (125 mcg total) by mouth daily before breakfast.   FLUoxetine (PROZAC) 40 MG capsule Take 1 capsule (40 mg total) by mouth daily.   fluticasone (FLONASE) 50 MCG/ACT nasal spray PLACE 2 SPRAYS INTO BOTH NOSTRILS DAILY   hydrochlorothiazide (HYDRODIURIL) 25 MG tablet TAKE (1) TABLET BY MOUTH EVERY DAY   lisinopril (ZESTRIL) 20 MG tablet TAKE (1) TABLET BY MOUTH EVERY DAY   Multiple  Vitamins-Minerals (CENTRUM SILVER PO) Take by mouth.   naproxen sodium (ALEVE) 220 MG tablet Take 220 mg by mouth.   Probiotic Product (CULTURELLE PROBIOTICS PO) Take by mouth.   rosuvastatin (CRESTOR) 5 MG tablet TAKE (1) TABLET BY MOUTH EVERY DAY   tirzepatide (ZEPBOUND) 2.5 MG/0.5ML injection vial Inject 2.5 mg into the skin once a week.       10/17/2023    2:36 PM 08/24/2023   10:10 AM 05/23/2023    9:02 AM 02/10/2023    9:55 AM  GAD 7 : Generalized Anxiety Score  Nervous, Anxious, on Edge 1 2 2 2   Control/stop worrying 0 2 0 1  Worry too much - different things 2 2 1 1   Trouble relaxing 1 2 2 1   Restless 0 0 0 0  Easily annoyed or irritable 1 0 0 1  Afraid - awful might happen 1 0 0 1  Total GAD 7 Score 6 8 5 7   Anxiety Difficulty Somewhat difficult Very difficult Somewhat difficult Somewhat difficult       10/17/2023    2:36 PM 08/24/2023   10:09 AM 05/23/2023    9:01 AM  Depression screen PHQ 2/9  Decreased Interest 1 3 2   Down, Depressed, Hopeless 1 3 2   PHQ - 2 Score 2 6 4   Altered sleeping 2 3 3   Tired, decreased energy 2 3 3   Change in appetite 0 2 0  Feeling bad or failure about yourself  0 0 0  Trouble concentrating 1 2 2   Moving slowly or fidgety/restless 0 0 3  Suicidal thoughts 0 2 0  PHQ-9 Score 7 18 15   Difficult doing work/chores Somewhat difficult Very difficult Somewhat difficult    BP Readings from Last 3 Encounters:  10/17/23 122/74  08/24/23 128/74  06/05/23 136/61    Physical Exam Vitals and nursing note reviewed.  Constitutional:      General: She is not in acute distress.    Appearance: She is well-developed.  HENT:     Head: Normocephalic and atraumatic.  Cardiovascular:     Rate and Rhythm: Normal rate and regular rhythm.     Heart sounds: No murmur heard. Pulmonary:     Effort: Pulmonary effort is normal. No respiratory distress.     Breath sounds: No wheezing or rhonchi.  Musculoskeletal:     Cervical back: Normal range of motion.      Right lower leg: Edema present.     Left lower leg: Edema present.     Comments: Trace edema  Lymphadenopathy:     Cervical: No cervical adenopathy.  Skin:    General: Skin is warm and dry.     Findings: No rash.  Neurological:     General: No focal deficit present.     Mental Status: She is alert and oriented to person, place, and time.  Psychiatric:        Mood and Affect: Mood normal.        Behavior: Behavior normal.     Wt Readings from Last 3 Encounters:  10/17/23 225 lb 2 oz (102.1 kg)  08/24/23 225 lb (102.1 kg)  06/05/23 220 lb (99.8 kg)    BP 122/74   Pulse 90   Ht 5\' 1"  (1.549 m)   Wt 225 lb 2 oz (102.1 kg)   SpO2 98%   BMI 42.54 kg/m   Assessment and Plan:  Problem List Items Addressed This Visit       Unprioritized   Recurrent major depression in partial remission (HCC) - Primary (Chronic)   Clinically improved on higher dose Prozac 40 mg.  No SI or HI on evaluation. Some of her family stresses have been resolved. Will continue current regimen for now.        Hypothyroidism due to Hashimoto's thyroiditis (Chronic)   Therapy changed to Euthyrox 125 mcg 6 weeks ago. She feels like she is doing a bit better.  Will recheck labs today. Increase dose to keep TSH low as long as T4 and T3 are normal.      Relevant Orders   Thyroid Panel With TSH   Degenerative arthritis of knee   Relevant Medications   tirzepatide (ZEPBOUND) 2.5 MG/0.5ML injection vial   BMI 40.0-44.9, adult (HCC)   She is a candidate for GLP-1 medication.  I will send in Zepbound and do PA if needed. She will also consider compounding from [facility]      Relevant Medications   tirzepatide (ZEPBOUND) 2.5 MG/0.5ML injection vial    No follow-ups on file.    Reubin Milan, MD [facility] [place]

## 2023-10-17 NOTE — Assessment & Plan Note (Addendum)
 Therapy changed to Euthyrox 125 mcg 6 weeks ago. She feels like she is doing a bit better.  Will recheck labs today. Increase dose to keep TSH low as long as T4 and T3 are normal.

## 2023-10-17 NOTE — Assessment & Plan Note (Signed)
 She is a candidate for GLP-1 medication. I will send in Zepbound and do PA if needed. She will also consider compounding from [facility]

## 2023-10-17 NOTE — Assessment & Plan Note (Addendum)
 Clinically improved on higher dose Prozac 40 mg.  No SI or HI on evaluation. Some of her family stresses have been resolved. Will continue current regimen for now.

## 2023-10-18 ENCOUNTER — Encounter: Payer: Self-pay | Admitting: Internal Medicine

## 2023-10-18 LAB — THYROID PANEL WITH TSH
Free Thyroxine Index: 2.8 (ref 1.2–4.9)
T3 Uptake Ratio: 29 % (ref 24–39)
T4, Total: 9.5 ug/dL (ref 4.5–12.0)
TSH: 0.076 u[IU]/mL — ABNORMAL LOW (ref 0.450–4.500)

## 2023-11-29 ENCOUNTER — Other Ambulatory Visit: Payer: Self-pay | Admitting: Internal Medicine

## 2023-11-29 DIAGNOSIS — E063 Autoimmune thyroiditis: Secondary | ICD-10-CM

## 2023-12-05 ENCOUNTER — Other Ambulatory Visit: Payer: Self-pay | Admitting: Internal Medicine

## 2023-12-05 DIAGNOSIS — I10 Essential (primary) hypertension: Secondary | ICD-10-CM

## 2023-12-07 NOTE — Telephone Encounter (Signed)
 Requested Prescriptions  Pending Prescriptions Disp Refills   hydrochlorothiazide  (HYDRODIURIL ) 25 MG tablet [Pharmacy Med Name: HYDROCHLOROTHIAZIDE  25MG  TABLET] 90 tablet 1    Sig: TAKE (1) TABLET BY MOUTH EVERY DAY     Cardiovascular: Diuretics - Thiazide Passed - 12/07/2023 10:34 AM      Passed - Cr in normal range and within 180 days    Creatinine, Ser  Date Value Ref Range Status  08/24/2023 0.78 0.57 - 1.00 mg/dL Final         Passed - K in normal range and within 180 days    Potassium  Date Value Ref Range Status  08/24/2023 4.1 3.5 - 5.2 mmol/L Final         Passed - Na in normal range and within 180 days    Sodium  Date Value Ref Range Status  08/24/2023 144 134 - 144 mmol/L Final         Passed - Last BP in normal range    BP Readings from Last 1 Encounters:  10/17/23 122/74         Passed - Valid encounter within last 6 months    Recent Outpatient Visits           1 month ago Recurrent major depression in partial remission [facility])   [place] [facility] at [facility], Chales [place], MD   3 months ago Essential hypertension   [facility] at [facility], Chales [place], MD

## 2023-12-29 ENCOUNTER — Other Ambulatory Visit: Payer: Self-pay | Admitting: Internal Medicine

## 2023-12-29 DIAGNOSIS — I10 Essential (primary) hypertension: Secondary | ICD-10-CM

## 2024-01-01 NOTE — Telephone Encounter (Signed)
 Requested Prescriptions  Refused Prescriptions Disp Refills   lisinopril  (ZESTRIL ) 20 MG tablet [Pharmacy Med Name: LISINOPRIL  20MG  TABLET] 90 tablet 1    Sig: TAKE (1) TABLET BY MOUTH EVERY DAY     Cardiovascular:  ACE Inhibitors Passed - 01/01/2024  1:34 PM      Passed - Cr in normal range and within 180 days    Creatinine, Ser  Date Value Ref Range Status  08/24/2023 0.78 0.57 - 1.00 mg/dL Final         Passed - K in normal range and within 180 days    Potassium  Date Value Ref Range Status  08/24/2023 4.1 3.5 - 5.2 mmol/L Final         Passed - Patient is not pregnant      Passed - Last BP in normal range    BP Readings from Last 1 Encounters:  10/17/23 122/74         Passed - Valid encounter within last 6 months    Recent Outpatient Visits           2 months ago Recurrent major depression in partial remission [facility])   [place] [facility] at [facility], Chales [place], MD   4 months ago Essential hypertension   [facility] at [facility], Chales [place], MD

## 2024-01-17 ENCOUNTER — Other Ambulatory Visit: Payer: Self-pay | Admitting: Internal Medicine

## 2024-01-17 DIAGNOSIS — I10 Essential (primary) hypertension: Secondary | ICD-10-CM

## 2024-01-18 NOTE — Telephone Encounter (Signed)
 Requested Prescriptions  Pending Prescriptions Disp Refills   lisinopril  (ZESTRIL ) 20 MG tablet [Pharmacy Med Name: LISINOPRIL  20MG  TABLET] 90 tablet 0    Sig: TAKE (1) TABLET BY MOUTH EVERY DAY     Cardiovascular:  ACE Inhibitors Passed - 01/18/2024  8:23 PM      Passed - Cr in normal range and within 180 days    Creatinine, Ser  Date Value Ref Range Status  08/24/2023 0.78 0.57 - 1.00 mg/dL Final         Passed - K in normal range and within 180 days    Potassium  Date Value Ref Range Status  08/24/2023 4.1 3.5 - 5.2 mmol/L Final         Passed - Patient is not pregnant      Passed - Last BP in normal range    BP Readings from Last 1 Encounters:  10/17/23 122/74         Passed - Valid encounter within last 6 months    Recent Outpatient Visits           3 months ago Recurrent major depression in partial remission [facility])   [place] [facility] at [facility], Leita DEL, MD   4 months ago Essential hypertension   [facility] Medicine at [facility], Leita DEL, MD

## 2024-02-27 ENCOUNTER — Other Ambulatory Visit: Payer: Self-pay | Admitting: Internal Medicine

## 2024-02-27 DIAGNOSIS — E782 Mixed hyperlipidemia: Secondary | ICD-10-CM

## 2024-03-01 NOTE — Telephone Encounter (Signed)
 Requested by interface surescripts .  Requested Prescriptions  Pending Prescriptions Disp Refills   rosuvastatin  (CRESTOR ) 5 MG tablet [Pharmacy Med Name: ROSUVASTATIN  CALCIUM  5MG  TABLET] 90 tablet 0    Sig: TAKE (1) TABLET BY MOUTH EVERY DAY     Cardiovascular:  Antilipid - Statins 2 Failed - 03/01/2024 10:50 AM      Failed - Lipid Panel in normal range within the last 12 months    Cholesterol, Total  Date Value Ref Range Status  05/23/2023 176 100 - 199 mg/dL Final   LDL Chol Calc (NIH)  Date Value Ref Range Status  05/23/2023 87 0 - 99 mg/dL Final   HDL  Date Value Ref Range Status  05/23/2023 65 >39 mg/dL Final   Triglycerides  Date Value Ref Range Status  05/23/2023 139 0 - 149 mg/dL Final         Passed - Cr in normal range and within 360 days    Creatinine, Ser  Date Value Ref Range Status  08/24/2023 0.78 0.57 - 1.00 mg/dL Final         Passed - Patient is not pregnant      Passed - Valid encounter within last 12 months    Recent Outpatient Visits           4 months ago Recurrent major depression in partial remission (HCC)   [place] [facility] at [facility], Leita DEL, MD   6 months ago Essential hypertension   [facility] Primary Care & Sports Medicine at [facility], Leita DEL, MD

## 2024-03-03 ENCOUNTER — Encounter: Payer: Self-pay | Admitting: Internal Medicine

## 2024-03-04 ENCOUNTER — Other Ambulatory Visit: Payer: Self-pay | Admitting: Internal Medicine

## 2024-03-04 DIAGNOSIS — E063 Autoimmune thyroiditis: Secondary | ICD-10-CM

## 2024-03-04 NOTE — Progress Notes (Unsigned)
 Date:  03/04/2024   Name:  Deanna Johnson   DOB:  [birth year]   MRN:  [id number]   Chief Complaint: No chief complaint on file.  HPI  Review of Systems   Lab Results  Component Value Date   NA 144 08/24/2023   K 4.1 08/24/2023   CO2 25 08/24/2023   GLUCOSE 91 08/24/2023   BUN 16 08/24/2023   CREATININE 0.78 08/24/2023   CALCIUM  9.3 08/24/2023   EGFR 80 08/24/2023   GFRNONAA >60 08/19/2022   Lab Results  Component Value Date   CHOL 176 05/23/2023   HDL 65 05/23/2023   LDLCALC 87 05/23/2023   TRIG 139 05/23/2023   CHOLHDL 2.7 05/23/2023   Lab Results  Component Value Date   TSH 0.076 (L) 10/17/2023   Lab Results  Component Value Date   HGBA1C 5.5 02/08/2022   Lab Results  Component Value Date   WBC 5.5 02/22/2023   HGB 13.9 02/22/2023   HCT 43.2 02/22/2023   MCV 86 02/22/2023   PLT 273 02/22/2023   Lab Results  Component Value Date   ALT 9 08/24/2023   AST 13 08/24/2023   ALKPHOS 62 08/24/2023   BILITOT 0.6 08/24/2023   Lab Results  Component Value Date   VD25OH 34.7 07/19/2022     Patient Active Problem List   Diagnosis Date Noted   Environmental and seasonal allergies 02/10/2023   Status post total knee replacement using cement, left 08/18/2022   Lumbar radiculitis 04/29/2020   Cervical spondylosis with myelopathy 10/25/2018   BMI 40.0-44.9, adult (HCC) 06/27/2018   Hyperlipidemia, mixed 05/05/2017   Hypothyroidism due to Hashimoto's thyroiditis 05/05/2017   Recurrent major depression in partial remission (HCC) 04/11/2016   Hx of colonic polyp 04/11/2016   Degenerative arthritis of knee 12/31/2014   Essential hypertension 12/31/2014   Impingement syndrome of left shoulder 12/16/2014   H/O fibromyalgia 08/18/2012    No Known Allergies  Past Surgical History:  Procedure Laterality Date   Anterial cervical dissectomy Fusion  02/23/2021   BREAST BIOPSY Right 1987   neg   BREAST SURGERY     RIGHT BREAST BIOPSY   BUNIONECTOMY      CATARACT EXTRACTION W/PHACO Left 01/04/2019   Procedure: CATARACT EXTRACTION PHACO AND INTRAOCULAR LENS PLACEMENT (IOC)  LEFT;  Surgeon: Myrna Adine Anes, MD;  Location: [facility];  Service: Ophthalmology;  Laterality: Left;   CATARACT EXTRACTION W/PHACO Right 02/01/2019   Procedure: CATARACT EXTRACTION PHACO AND INTRAOCULAR LENS PLACEMENT (IOC)  RIGHT;  Surgeon: Myrna Adine Anes, MD;  Location: [facility];  Service: Ophthalmology;  Laterality: Right;   COLONOSCOPY  2011   benign polyps - f/u 2016   COLONOSCOPY WITH PROPOFOL  N/A 07/21/2017   Procedure: COLONOSCOPY WITH PROPOFOL ;  Surgeon: Gaylyn Lunger PENNER, MD;  Location: [facility];  Service: Endoscopy;  Laterality: N/A;   COLONOSCOPY WITH PROPOFOL  N/A 06/05/2023   Procedure: COLONOSCOPY WITH PROPOFOL ;  Surgeon: Maryruth Ole DASEN, MD;  Location: [facility] ENDOSCOPY;  Service: Endoscopy;  Laterality: N/A;   EYE SURGERY  June & July 2020   cataract- both eyes   FRACTURE SURGERY  1981   MAXILLARY FACIAL SURGERY   MANDIBLE FRACTURE SURGERY     jaw resection   shoulder arthroscopy Right    SHOULDER ARTHROSCOPY WITH OPEN ROTATOR CUFF REPAIR Left 01/30/2015   Procedure: SHOULDER ARTHROSCOPY WITH POSS OPEN ROTATOR CUFF REPAIR;  Surgeon: Helayne Glenn, MD;  Location: [facility];  Service: Orthopedics;  Laterality:  Left;  Release of long head of biceps tendon Subacromial decompression Mini open rotator cuff repair   SPINE SURGERY  February 23, 2021   ACDF surgery C4-C7   TONSILLECTOMY     TOTAL ABDOMINAL HYSTERECTOMY     TOTAL KNEE ARTHROPLASTY Left 08/18/2022   Procedure: TOTAL KNEE ARTHROPLASTY;  Surgeon: Edie Norleen PARAS, MD;  Location: [facility] ORS;  Service: Orthopedics;  Laterality: Left;   TUBAL LIGATION  1978    Social History   Tobacco Use   Smoking status: Former    Current packs/day: 0.00    Average packs/day: 0.3 packs/day for 3.0 years (0.8 ttl pk-yrs)    Types: Cigarettes    Start date: 07/19/1967     Quit date: 07/18/1970    Years since quitting: 53.6   Smokeless tobacco: Never   Tobacco comments:    quit at age 29  Vaping Use   Vaping status: Never Used  Substance Use Topics   Alcohol use: No   Drug use: No     Medication list has been reviewed and updated.  No outpatient medications have been marked as taking for the 03/04/24 encounter (Orders Only) with Justus Leita DEL, MD.       10/17/2023    2:36 PM 08/24/2023   10:10 AM 05/23/2023    9:02 AM 02/10/2023    9:55 AM  GAD 7 : Generalized Anxiety Score  Nervous, Anxious, on Edge 1 2 2 2   Control/stop worrying 0 2 0 1  Worry too much - different things 2 2 1 1   Trouble relaxing 1 2 2 1   Restless 0 0 0 0  Easily annoyed or irritable 1 0 0 1  Afraid - awful might happen 1 0 0 1  Total GAD 7 Score 6 8 5 7   Anxiety Difficulty Somewhat difficult Very difficult Somewhat difficult Somewhat difficult       10/17/2023    2:36 PM 08/24/2023   10:09 AM 05/23/2023    9:01 AM  Depression screen PHQ 2/9  Decreased Interest 1 3 2   Down, Depressed, Hopeless 1 3 2   PHQ - 2 Score 2 6 4   Altered sleeping 2 3 3   Tired, decreased energy 2 3 3   Change in appetite 0 2 0  Feeling bad or failure about yourself  0 0 0  Trouble concentrating 1 2 2   Moving slowly or fidgety/restless 0 0 3  Suicidal thoughts 0 2 0  PHQ-9 Score 7 18 15   Difficult doing work/chores Somewhat difficult Very difficult Somewhat difficult    BP Readings from Last 3 Encounters:  10/17/23 122/74  08/24/23 128/74  06/05/23 136/61    Physical Exam  Wt Readings from Last 3 Encounters:  10/17/23 225 lb 2 oz (102.1 kg)  08/24/23 225 lb (102.1 kg)  06/05/23 220 lb (99.8 kg)    There were no vitals taken for this visit.  Assessment and Plan:  Problem List Items Addressed This Visit   None   No follow-ups on file.    Leita HILARIO Justus, MD [facility] [place]

## 2024-03-05 DIAGNOSIS — E063 Autoimmune thyroiditis: Secondary | ICD-10-CM | POA: Diagnosis not present

## 2024-03-05 DIAGNOSIS — E039 Hypothyroidism, unspecified: Secondary | ICD-10-CM | POA: Diagnosis not present

## 2024-03-06 ENCOUNTER — Ambulatory Visit: Payer: Self-pay | Admitting: Internal Medicine

## 2024-03-06 LAB — TSH+T4F+T3FREE
Free T4: 1.72 ng/dL (ref 0.82–1.77)
T3, Free: 2.9 pg/mL (ref 2.0–4.4)
TSH: 0.067 u[IU]/mL — ABNORMAL LOW (ref 0.450–4.500)

## 2024-03-12 DIAGNOSIS — H43813 Vitreous degeneration, bilateral: Secondary | ICD-10-CM | POA: Diagnosis not present

## 2024-03-12 DIAGNOSIS — Z961 Presence of intraocular lens: Secondary | ICD-10-CM | POA: Diagnosis not present

## 2024-03-12 DIAGNOSIS — H40003 Preglaucoma, unspecified, bilateral: Secondary | ICD-10-CM | POA: Diagnosis not present

## 2024-03-12 DIAGNOSIS — H26492 Other secondary cataract, left eye: Secondary | ICD-10-CM | POA: Diagnosis not present

## 2024-03-12 DIAGNOSIS — Z0100 Encounter for examination of eyes and vision without abnormal findings: Secondary | ICD-10-CM | POA: Diagnosis not present

## 2024-03-19 ENCOUNTER — Encounter: Payer: Self-pay | Admitting: Internal Medicine

## 2024-03-19 ENCOUNTER — Other Ambulatory Visit: Payer: Self-pay | Admitting: Internal Medicine

## 2024-03-19 DIAGNOSIS — E063 Autoimmune thyroiditis: Secondary | ICD-10-CM

## 2024-03-19 NOTE — Telephone Encounter (Signed)
 Requested Prescriptions  Refused Prescriptions Disp Refills   EUTHYROX  125 MCG tablet [Pharmacy Med Name: EUTHYROX  TABLET] 90 tablet 1    Sig: TAKE (1) TABLET BY MOUTH EVERY MORNING BEFORE BREAKFAST     Endocrinology:  Hypothyroid Agents Failed - 03/19/2024  5:06 PM      Failed - TSH in normal range and within 360 days    TSH  Date Value Ref Range Status  03/05/2024 0.067 (L) 0.450 - 4.500 uIU/mL Final         Passed - Valid encounter within last 12 months    Recent Outpatient Visits           5 months ago Recurrent major depression in partial remission [facility])   [place] [facility] at [facility], Leita DEL, MD   6 months ago Essential hypertension   [facility] at [facility], Leita DEL, MD

## 2024-03-20 ENCOUNTER — Other Ambulatory Visit: Payer: Self-pay | Admitting: Internal Medicine

## 2024-03-20 DIAGNOSIS — E063 Autoimmune thyroiditis: Secondary | ICD-10-CM

## 2024-03-20 MED ORDER — TIROSINT-SOL 125 MCG/ML PO SOLN: 125.0000 ug | Freq: Every day | ORAL | 1 refills | Status: AC

## 2024-03-20 NOTE — Progress Notes (Unsigned)
 Date:  03/20/2024   Name:  Deanna Johnson   DOB:  [birth year]   MRN:  [id number]   Chief Complaint: No chief complaint on file.  HPI  Review of Systems   Lab Results  Component Value Date   NA 144 08/24/2023   K 4.1 08/24/2023   CO2 25 08/24/2023   GLUCOSE 91 08/24/2023   BUN 16 08/24/2023   CREATININE 0.78 08/24/2023   CALCIUM  9.3 08/24/2023   EGFR 80 08/24/2023   GFRNONAA >60 08/19/2022   Lab Results  Component Value Date   CHOL 176 05/23/2023   HDL 65 05/23/2023   LDLCALC 87 05/23/2023   TRIG 139 05/23/2023   CHOLHDL 2.7 05/23/2023   Lab Results  Component Value Date   TSH 0.067 (L) 03/05/2024   Lab Results  Component Value Date   HGBA1C 5.5 02/08/2022   Lab Results  Component Value Date   WBC 5.5 02/22/2023   HGB 13.9 02/22/2023   HCT 43.2 02/22/2023   MCV 86 02/22/2023   PLT 273 02/22/2023   Lab Results  Component Value Date   ALT 9 08/24/2023   AST 13 08/24/2023   ALKPHOS 62 08/24/2023   BILITOT 0.6 08/24/2023   Lab Results  Component Value Date   VD25OH 34.7 07/19/2022     Patient Active Problem List   Diagnosis Date Noted   Environmental and seasonal allergies 02/10/2023   Status post total knee replacement using cement, left 08/18/2022   Lumbar radiculitis 04/29/2020   Cervical spondylosis with myelopathy 10/25/2018   BMI 40.0-44.9, adult (HCC) 06/27/2018   Hyperlipidemia, mixed 05/05/2017   Hypothyroidism due to Hashimoto's thyroiditis 05/05/2017   Recurrent major depression in partial remission (HCC) 04/11/2016   Hx of colonic polyp 04/11/2016   Degenerative arthritis of knee 12/31/2014   Essential hypertension 12/31/2014   Impingement syndrome of left shoulder 12/16/2014   H/O fibromyalgia 08/18/2012    No Known Allergies  Past Surgical History:  Procedure Laterality Date   Anterial cervical dissectomy Fusion  02/23/2021   BREAST BIOPSY Right 1987   neg   BREAST SURGERY     RIGHT BREAST BIOPSY   BUNIONECTOMY      CATARACT EXTRACTION W/PHACO Left 01/04/2019   Procedure: CATARACT EXTRACTION PHACO AND INTRAOCULAR LENS PLACEMENT (IOC)  LEFT;  Surgeon: Myrna Adine Anes, MD;  Location: [facility];  Service: Ophthalmology;  Laterality: Left;   CATARACT EXTRACTION W/PHACO Right 02/01/2019   Procedure: CATARACT EXTRACTION PHACO AND INTRAOCULAR LENS PLACEMENT (IOC)  RIGHT;  Surgeon: Myrna Adine Anes, MD;  Location: [facility];  Service: Ophthalmology;  Laterality: Right;   COLONOSCOPY  2011   benign polyps - f/u 2016   COLONOSCOPY WITH PROPOFOL  N/A 07/21/2017   Procedure: COLONOSCOPY WITH PROPOFOL ;  Surgeon: Gaylyn Lunger PENNER, MD;  Location: [facility] END[facility]COPY;  Service: Endoscopy;  Laterality: N/A;   COLONOSCOPY WITH PROPOFOL  N/A 06/05/2023   Procedure: COLONOSCOPY WITH PROPOFOL ;  Surgeon: Maryruth Ole DASEN, MD;  Location: [facility] ENDOSCOPY;  Service: Endoscopy;  Laterality: N/A;   EYE SURGERY  June & July 2020   cataract- both eyes   FRACTURE SURGERY  1981   MAXILLARY FACIAL SURGERY   MANDIBLE FRACTURE SURGERY     jaw resection   shoulder arthroscopy Right    SHOULDER ARTHROSCOPY WITH OPEN ROTATOR CUFF REPAIR Left 01/30/2015   Procedure: SHOULDER ARTHROSCOPY WITH POSS OPEN ROTATOR CUFF REPAIR;  Surgeon: Helayne Glenn, MD;  Location: [facility];  Service: Orthopedics;  Laterality:  Left;  Release of long head of biceps tendon Subacromial decompression Mini open rotator cuff repair   SPINE SURGERY  February 23, 2021   ACDF surgery C4-C7   TONSILLECTOMY     TOTAL ABDOMINAL HYSTERECTOMY     TOTAL KNEE ARTHROPLASTY Left 08/18/2022   Procedure: TOTAL KNEE ARTHROPLASTY;  Surgeon: Edie Norleen PARAS, MD;  Location: [facility] ORS;  Service: Orthopedics;  Laterality: Left;   TUBAL LIGATION  1978    Social History   Tobacco Use   Smoking status: Former    Current packs/day: 0.00    Average packs/day: 0.3 packs/day for 3.0 years (0.8 ttl pk-yrs)    Types: Cigarettes    Start date: 07/19/1967     Quit date: 07/18/1970    Years since quitting: 53.7   Smokeless tobacco: Never   Tobacco comments:    quit at age 7  Vaping Use   Vaping status: Never Used  Substance Use Topics   Alcohol use: No   Drug use: No     Medication list has been reviewed and updated.  No outpatient medications have been marked as taking for the 03/20/24 encounter (Orders Only) with Justus Leita DEL, MD.       10/17/2023    2:36 PM 08/24/2023   10:10 AM 05/23/2023    9:02 AM 02/10/2023    9:55 AM  GAD 7 : Generalized Anxiety Score  Nervous, Anxious, on Edge 1 2 2 2   Control/stop worrying 0 2 0 1  Worry too much - different things 2 2 1 1   Trouble relaxing 1 2 2 1   Restless 0 0 0 0  Easily annoyed or irritable 1 0 0 1  Afraid - awful might happen 1 0 0 1  Total GAD 7 Score 6 8 5 7   Anxiety Difficulty Somewhat difficult Very difficult Somewhat difficult Somewhat difficult       10/17/2023    2:36 PM 08/24/2023   10:09 AM 05/23/2023    9:01 AM  Depression screen PHQ 2/9  Decreased Interest 1 3 2   Down, Depressed, Hopeless 1 3 2   PHQ - 2 Score 2 6 4   Altered sleeping 2 3 3   Tired, decreased energy 2 3 3   Change in appetite 0 2 0  Feeling bad or failure about yourself  0 0 0  Trouble concentrating 1 2 2   Moving slowly or fidgety/restless 0 0 3  Suicidal thoughts 0 2 0  PHQ-9 Score 7 18 15   Difficult doing work/chores Somewhat difficult Very difficult Somewhat difficult    BP Readings from Last 3 Encounters:  10/17/23 122/74  08/24/23 128/74  06/05/23 136/61    Physical Exam  Wt Readings from Last 3 Encounters:  10/17/23 225 lb 2 oz (102.1 kg)  08/24/23 225 lb (102.1 kg)  06/05/23 220 lb (99.8 kg)    There were no vitals taken for this visit.  Assessment and Plan:  Problem List Items Addressed This Visit   None   No follow-ups on file.    Leita HILARIO Justus, MD [facility] [place]

## 2024-03-20 NOTE — Telephone Encounter (Signed)
 Please review and send RX if appropriate.  Thank you,  Lelan Cush

## 2024-04-22 ENCOUNTER — Ambulatory Visit (INDEPENDENT_AMBULATORY_CARE_PROVIDER_SITE_OTHER): Admitting: Internal Medicine

## 2024-04-22 ENCOUNTER — Encounter: Payer: Self-pay | Admitting: Internal Medicine

## 2024-04-22 VITALS — BP 124/78 | HR 97 | Ht 61.0 in | Wt 225.0 lb

## 2024-04-22 DIAGNOSIS — E063 Autoimmune thyroiditis: Secondary | ICD-10-CM | POA: Diagnosis not present

## 2024-04-22 DIAGNOSIS — Z8739 Personal history of other diseases of the musculoskeletal system and connective tissue: Secondary | ICD-10-CM

## 2024-04-22 DIAGNOSIS — Z1231 Encounter for screening mammogram for malignant neoplasm of breast: Secondary | ICD-10-CM

## 2024-04-22 DIAGNOSIS — E559 Vitamin D deficiency, unspecified: Secondary | ICD-10-CM | POA: Insufficient documentation

## 2024-04-22 DIAGNOSIS — I10 Essential (primary) hypertension: Secondary | ICD-10-CM | POA: Diagnosis not present

## 2024-04-22 DIAGNOSIS — R739 Hyperglycemia, unspecified: Secondary | ICD-10-CM

## 2024-04-22 DIAGNOSIS — F3341 Major depressive disorder, recurrent, in partial remission: Secondary | ICD-10-CM

## 2024-04-22 DIAGNOSIS — Z0000 Encounter for general adult medical examination without abnormal findings: Secondary | ICD-10-CM | POA: Diagnosis not present

## 2024-04-22 DIAGNOSIS — E782 Mixed hyperlipidemia: Secondary | ICD-10-CM | POA: Diagnosis not present

## 2024-04-22 DIAGNOSIS — E538 Deficiency of other specified B group vitamins: Secondary | ICD-10-CM | POA: Diagnosis not present

## 2024-04-22 MED ORDER — AZELASTINE HCL 0.1 % NA SOLN: 2.0000 | Freq: Two times a day (BID) | NASAL | 5 refills | Status: AC

## 2024-04-22 MED ORDER — FLUOXETINE HCL 20 MG PO CAPS: 20.0000 mg | ORAL_CAPSULE | Freq: Every day | ORAL | 1 refills | Status: DC

## 2024-04-22 NOTE — Assessment & Plan Note (Addendum)
 Well controlled blood pressure today. Current regimen is lisinopril  and hydrochlorothiazide .. No medication side effects noted.  Recommend low sodium diet to reduce swelling. No medication change at this time.

## 2024-04-22 NOTE — Assessment & Plan Note (Addendum)
 On Tirosint -sol in place of Armour thyroid  now.  She feels best if TSH is less than 1.00. Doubt that her thyroid  disease is contributing much to her fatigue at this time. Lab Results  Component Value Date   TSH 0.067 (L) 03/05/2024  Will recheck labs.  Consider adding low dose T3 if needed - this can boost mood as well.

## 2024-04-22 NOTE — Patient Instructions (Addendum)
 Call [facility] to schedule your mammogram at [phone].   Start a B12 or B complex vitamin and a Vitamin D  - each daily.

## 2024-04-22 NOTE — Assessment & Plan Note (Addendum)
 Depression and anxiety symptoms are stable on Prozac .  Dose increased recently without much change. No SI/HI reported. I recommend reducing the dose back to 20 mg.  If worsening, she may benefit from adding an atypical antidepressant like Vraylar.

## 2024-04-22 NOTE — Assessment & Plan Note (Signed)
 LDL is  Lab Results  Component Value Date   LDLCALC 87 05/23/2023    Current medication regimen is Crestor . Goal LDL is < 70.

## 2024-04-22 NOTE — Assessment & Plan Note (Addendum)
 Symptoms are stable.  However she is having fatigue and chronic symptoms She has never tried cymbalta but is not ready to make a big medication change at this time. I recommend a B-complex vitamin and Vitamin D  daily

## 2024-04-22 NOTE — Progress Notes (Signed)
 Date:  04/22/2024   Name:  Deanna Johnson   DOB:  Mar 15, 1951   MRN:  [id number]   Chief Complaint: Annual Exam Deanna Johnson is a 73 y.o. female who presents today for her Complete Annual Exam. She feels fairly well. She reports exercising- none. She reports she is sleeping too much. Breast complaints - none.  Health Maintenance  Topic Date Due   Medicare Annual Wellness Visit  04/11/2024   DTaP/Tdap/Td vaccine (2 - Td or Tdap) 04/22/2025*   COVID-19 Vaccine (8 - 2025-26 season) 06/12/2024   Breast Cancer Screening  07/24/2024   DEXA scan (bone density measurement)  03/17/2025   Colon Cancer Screening  06/04/2028   Pneumococcal Vaccine for age over 32  Completed   Flu Shot  Completed   Zoster (Shingles) Vaccine  Completed   Hepatitis C Screening  Addressed   Meningitis B Vaccine  Aged Out  *Topic was postponed. The date shown is not the original due date.    Hypertension This is a chronic problem. The problem is controlled. Pertinent negatives include no chest pain, headaches, palpitations or shortness of breath. Past treatments include ACE inhibitors and diuretics. The current treatment provides significant improvement. Identifiable causes of hypertension include a thyroid  problem.  Hyperlipidemia This is a chronic problem. The problem is controlled. Associated symptoms include myalgias. Pertinent negatives include no chest pain or shortness of breath. Current antihyperlipidemic treatment includes statins.  Thyroid  Problem Presents for follow-up visit. Symptoms include fatigue. Patient reports no anxiety, constipation, diaphoresis, diarrhea or palpitations. The symptoms have been stable. Her past medical history is significant for hyperlipidemia.    Review of Systems  Constitutional:  Positive for fatigue. Negative for appetite change, chills, diaphoresis and unexpected weight change.  HENT:  Negative for trouble swallowing.   Eyes:  Negative for visual disturbance.   Respiratory:  Negative for cough, chest tightness, shortness of breath and wheezing.   Cardiovascular:  Negative for chest pain, palpitations and leg swelling.  Gastrointestinal:  Negative for abdominal pain, constipation and diarrhea.  Genitourinary:  Positive for hematuria. Negative for dysuria.  Musculoskeletal:  Positive for arthralgias and myalgias.  Neurological:  Negative for dizziness, weakness, light-headedness and headaches.  Psychiatric/Behavioral:  Positive for dysphoric mood. Negative for sleep disturbance. The patient is not nervous/anxious.      Lab Results  Component Value Date   NA 144 08/24/2023   K 4.1 08/24/2023   CO2 25 08/24/2023   GLUCOSE 91 08/24/2023   BUN 16 08/24/2023   CREATININE 0.78 08/24/2023   CALCIUM  9.3 08/24/2023   EGFR 80 08/24/2023   GFRNONAA >60 08/19/2022   Lab Results  Component Value Date   CHOL 176 05/23/2023   HDL 65 05/23/2023   LDLCALC 87 05/23/2023   TRIG 139 05/23/2023   CHOLHDL 2.7 05/23/2023   Lab Results  Component Value Date   TSH 0.067 (L) 03/05/2024   Lab Results  Component Value Date   HGBA1C 5.5 02/08/2022   Lab Results  Component Value Date   WBC 5.5 02/22/2023   HGB 13.9 02/22/2023   HCT 43.2 02/22/2023   MCV 86 02/22/2023   PLT 273 02/22/2023   Lab Results  Component Value Date   ALT 9 08/24/2023   AST 13 08/24/2023   ALKPHOS 62 08/24/2023   BILITOT 0.6 08/24/2023   Lab Results  Component Value Date   VD25OH 34.7 07/19/2022     Patient Active Problem List   Diagnosis Date Noted  Vitamin D  deficiency 04/22/2024   B12 deficiency due to diet 04/22/2024   Environmental and seasonal allergies 02/10/2023   Status post total knee replacement using cement, left 08/18/2022   Lumbar radiculitis 04/29/2020   Cervical spondylosis with myelopathy 10/25/2018   BMI 40.0-44.9, adult (HCC) 06/27/2018   Hyperlipidemia, mixed 05/05/2017   Hypothyroidism due to Hashimoto's thyroiditis 05/05/2017   Recurrent  major depression in partial remission 04/11/2016   Hx of colonic polyp 04/11/2016   Degenerative arthritis of knee 12/31/2014   Essential hypertension 12/31/2014   Impingement syndrome of left shoulder 12/16/2014   H/O fibromyalgia 08/18/2012    No Known Allergies  Past Surgical History:  Procedure Laterality Date   Anterial cervical dissectomy Fusion  02/23/2021   BREAST BIOPSY Right 1987   neg   BREAST SURGERY     RIGHT BREAST BIOPSY   BUNIONECTOMY     CATARACT EXTRACTION W/PHACO Left 01/04/2019   Procedure: CATARACT EXTRACTION PHACO AND INTRAOCULAR LENS PLACEMENT (IOC)  LEFT;  Surgeon: Myrna Adine Anes, MD;  Location: [facility];  Service: Ophthalmology;  Laterality: Left;   CATARACT EXTRACTION W/PHACO Right 02/01/2019   Procedure: CATARACT EXTRACTION PHACO AND INTRAOCULAR LENS PLACEMENT (IOC)  RIGHT;  Surgeon: Myrna Adine Anes, MD;  Location: [facility];  Service: Ophthalmology;  Laterality: Right;   COLONOSCOPY  2011   benign polyps - f/u 2016   COLONOSCOPY WITH PROPOFOL  N/A 07/21/2017   Procedure: COLONOSCOPY WITH PROPOFOL ;  Surgeon: Gaylyn Gladis PENNER, MD;  Location: [facility] ENDOSCOPY;  Service: Endoscopy;  Laterality: N/A;   COLONOSCOPY WITH PROPOFOL  N/A 06/05/2023   Procedure: COLONOSCOPY WITH PROPOFOL ;  Surgeon: Maryruth Ole DASEN, MD;  Location: [facility] ENDOSCOPY;  Service: Endoscopy;  Laterality: N/A;   EYE SURGERY  June & July 2020   cataract- both eyes   FRACTURE SURGERY  1981   MAXILLARY FACIAL SURGERY   JOINT REPLACEMENT     total left knee replacement   MANDIBLE FRACTURE SURGERY     jaw resection   shoulder arthroscopy Right    SHOULDER ARTHROSCOPY WITH OPEN ROTATOR CUFF REPAIR Left 01/30/2015   Procedure: SHOULDER ARTHROSCOPY WITH POSS OPEN ROTATOR CUFF REPAIR;  Surgeon: Helayne Glenn, MD;  Location: [facility];  Service: Orthopedics;  Laterality: Left;  Release of long head of biceps tendon Subacromial decompression Mini open  rotator cuff repair   SPINE SURGERY  February 23, 2021   ACDF surgery C4-C7   TONSILLECTOMY     TOTAL ABDOMINAL HYSTERECTOMY     TOTAL KNEE ARTHROPLASTY Left 08/18/2022   Procedure: TOTAL KNEE ARTHROPLASTY;  Surgeon: Edie Norleen PARAS, MD;  Location: [facility] ORS;  Service: Orthopedics;  Laterality: Left;   TUBAL LIGATION  1978    Social History   Tobacco Use   Smoking status: Former    Current packs/day: 0.00    Average packs/day: 0.3 packs/day for 3.0 years (0.8 ttl pk-yrs)    Types: Cigarettes    Start date: 07/19/1967    Quit date: 07/18/1970    Years since quitting: 53.8   Smokeless tobacco: Never   Tobacco comments:    quit at age 59  Vaping Use   Vaping status: Never Used  Substance Use Topics   Alcohol use: No   Drug use: No     Medication list has been reviewed and updated.  Current Meds  Medication Sig   acetaminophen  (TYLENOL ) 500 MG tablet Take 2 tablets (1,000 mg total) by mouth every 6 (six) hours.   azelastine (ASTELIN) 0.1 %  nasal spray Place 2 sprays into both nostrils 2 (two) times daily. Use in each nostril as directed   FLUoxetine  (PROZAC ) 20 MG capsule Take 1 capsule (20 mg total) by mouth daily.   hydrochlorothiazide  (HYDRODIURIL ) 25 MG tablet TAKE (1) TABLET BY MOUTH EVERY DAY   Levothyroxine  Sodium (TIROSINT -SOL) 125 MCG/ML SOLN Take 125 mcg by mouth daily at 6 (six) AM.   lisinopril  (ZESTRIL ) 20 MG tablet TAKE (1) TABLET BY MOUTH EVERY DAY. OFFICE VISIT NEEDED FOR ADDITIONAL REFILLS   Multiple Vitamins-Minerals (CENTRUM SILVER PO) Take by mouth.   naproxen sodium (ALEVE) 220 MG tablet Take 220 mg by mouth.   Probiotic Product (CULTURELLE PROBIOTICS PO) Take by mouth.   rosuvastatin  (CRESTOR ) 5 MG tablet TAKE (1) TABLET BY MOUTH EVERY DAY   [DISCONTINUED] FLUoxetine  (PROZAC ) 40 MG capsule Take 1 capsule (40 mg total) by mouth daily.       04/22/2024   11:05 AM 10/17/2023    2:36 PM 08/24/2023   10:10 AM 05/23/2023    9:02 AM  GAD 7 : Generalized Anxiety Score   Nervous, Anxious, on Edge 1 1 2 2   Control/stop worrying 1 0 2 0  Worry too much - different things 1 2 2 1   Trouble relaxing 0 1 2 2   Restless 0 0 0 0  Easily annoyed or irritable 0 1 0 0  Afraid - awful might happen 0 1 0 0  Total GAD 7 Score 3 6 8 5   Anxiety Difficulty Not difficult at all Somewhat difficult Very difficult Somewhat difficult       04/22/2024   11:05 AM 10/17/2023    2:36 PM 08/24/2023   10:09 AM  Depression screen PHQ 2/9  Decreased Interest 3 1 3   Down, Depressed, Hopeless 1 1 3   PHQ - 2 Score 4 2 6   Altered sleeping 3 2 3   Tired, decreased energy 3 2 3   Change in appetite 1 0 2  Feeling bad or failure about yourself  0 0 0  Trouble concentrating 0 1 2  Moving slowly or fidgety/restless 0 0 0  Suicidal thoughts 0 0 2  PHQ-9 Score 11 7 18   Difficult doing work/chores Not difficult at all Somewhat difficult Very difficult    BP Readings from Last 3 Encounters:  04/22/24 124/78  10/17/23 122/74  08/24/23 128/74    Physical Exam Vitals and nursing note reviewed.  Constitutional:      General: She is not in acute distress.    Appearance: Normal appearance. She is well-developed.  HENT:     Head: Normocephalic and atraumatic.     Right Ear: Tympanic membrane and ear canal normal.     Left Ear: Tympanic membrane and ear canal normal.     Nose:     Right Sinus: No maxillary sinus tenderness.     Left Sinus: No maxillary sinus tenderness.  Eyes:     General: No scleral icterus.       Right eye: No discharge.        Left eye: No discharge.     Conjunctiva/sclera: Conjunctivae normal.  Neck:     Thyroid : No thyromegaly.     Vascular: No carotid bruit.  Cardiovascular:     Rate and Rhythm: Normal rate and regular rhythm.     Pulses: Normal pulses.     Heart sounds: Normal heart sounds.  Pulmonary:     Effort: Pulmonary effort is normal. No respiratory distress.     Breath sounds: No  wheezing.  Abdominal:     General: Bowel sounds are normal.      Palpations: Abdomen is soft.     Tenderness: There is no abdominal tenderness.  Musculoskeletal:        General: Swelling (trace ankle edema) present.     Cervical back: Normal range of motion. No erythema.     Right lower leg: No edema.     Left lower leg: No edema.  Lymphadenopathy:     Cervical: No cervical adenopathy.  Skin:    General: Skin is warm and dry.     Findings: No rash.  Neurological:     General: No focal deficit present.     Mental Status: She is alert and oriented to person, place, and time.     Cranial Nerves: No cranial nerve deficit.     Sensory: No sensory deficit.     Deep Tendon Reflexes: Reflexes are normal and symmetric.  Psychiatric:        Attention and Perception: Attention normal.        Mood and Affect: Mood normal.     Wt Readings from Last 3 Encounters:  04/22/24 225 lb (102.1 kg)  10/17/23 225 lb 2 oz (102.1 kg)  08/24/23 225 lb (102.1 kg)    BP 124/78   Pulse 97   Ht 5' 1 (1.549 m)   Wt 225 lb (102.1 kg)   SpO2 97%   BMI 42.51 kg/m   Assessment and Plan:  Problem List Items Addressed This Visit       Unprioritized   B12 deficiency due to diet   Relevant Orders   Vitamin B12   Essential hypertension (Chronic)   Well controlled blood pressure today. Current regimen is lisinopril  and hydrochlorothiazide .. No medication side effects noted.  Recommend low sodium diet to reduce swelling. No medication change at this time.        Relevant Orders   CBC with Differential/Platelet   H/O fibromyalgia (Chronic)   Symptoms are stable.  However she is having fatigue and chronic symptoms She has never tried cymbalta but is not ready to make a big medication change at this time. I recommend a B-complex vitamin and Vitamin D  daily      Hyperlipidemia, mixed (Chronic)   LDL is  Lab Results  Component Value Date   LDLCALC 87 05/23/2023    Current medication regimen is Crestor . Goal LDL is < 70.       Relevant Orders    Comprehensive metabolic panel with GFR   Lipid panel   Hypothyroidism due to Hashimoto's thyroiditis (Chronic)   On Tirosint -sol in place of Armour thyroid  now.  She feels best if TSH is less than 1.00. Doubt that her thyroid  disease is contributing much to her fatigue at this time. Lab Results  Component Value Date   TSH 0.067 (L) 03/05/2024  Will recheck labs.  Consider adding low dose T3 if needed - this can boost mood as well.      Relevant Orders   TSH+T4F+T3Free   Recurrent major depression in partial remission (Chronic)   Depression and anxiety symptoms are stable on Prozac .  Dose increased recently without much change. No SI/HI reported. I recommend reducing the dose back to 20 mg.  If worsening, she may benefit from adding an atypical antidepressant like Vraylar.       Relevant Medications   FLUoxetine  (PROZAC ) 20 MG capsule   Vitamin D  deficiency   Relevant Orders   VITAMIN D  25  Hydroxy (Vit-D Deficiency, Fractures)   Other Visit Diagnoses       Annual physical exam    -  Primary   up to date on screenings and immunizations     Encounter for screening mammogram for breast cancer       Relevant Orders   MM 3D SCREENING MAMMOGRAM BILATERAL BREAST     Elevated serum glucose       long hx of insulin resistance with no progression to overt DM at this time.   Relevant Orders   Hemoglobin A1c      She is changing PCP to [facility].  Appointment in November. No follow-ups on file.    Leita HILARIO Adie, MD [facility] [place]

## 2024-04-23 ENCOUNTER — Ambulatory Visit: Payer: Self-pay | Admitting: Internal Medicine

## 2024-04-23 LAB — COMPREHENSIVE METABOLIC PANEL WITH GFR
ALT: 12 IU/L (ref 0–32)
AST: 16 IU/L (ref 0–40)
Albumin: 4.1 g/dL (ref 3.8–4.8)
Alkaline Phosphatase: 66 IU/L (ref 49–135)
BUN/Creatinine Ratio: 23 (ref 12–28)
BUN: 21 mg/dL (ref 8–27)
Bilirubin Total: 0.5 mg/dL (ref 0.0–1.2)
CO2: 25 mmol/L (ref 20–29)
Calcium: 9.3 mg/dL (ref 8.7–10.3)
Chloride: 102 mmol/L (ref 96–106)
Creatinine, Ser: 0.92 mg/dL (ref 0.57–1.00)
Globulin, Total: 2.6 g/dL (ref 1.5–4.5)
Glucose: 92 mg/dL (ref 70–99)
Potassium: 4.2 mmol/L (ref 3.5–5.2)
Sodium: 140 mmol/L (ref 134–144)
Total Protein: 6.7 g/dL (ref 6.0–8.5)
eGFR: 65 mL/min/1.73 (ref >59)

## 2024-04-23 LAB — TSH+T4F+T3FREE
Free T4: 1.98 ng/dL — ABNORMAL HIGH (ref 0.82–1.77)
T3, Free: 3.2 pg/mL (ref 2.0–4.4)
TSH: 0.018 u[IU]/mL — ABNORMAL LOW (ref 0.450–4.500)

## 2024-04-23 LAB — CBC WITH DIFFERENTIAL/PLATELET
Basophils Absolute: 0 x10E3/uL (ref 0.0–0.2)
Basos: 1 %
EOS (ABSOLUTE): 0.3 x10E3/uL (ref 0.0–0.4)
Eos: 5 %
Hematocrit: 42.8 % (ref 34.0–46.6)
Hemoglobin: 13.3 g/dL (ref 11.1–15.9)
Immature Grans (Abs): 0 x10E3/uL (ref 0.0–0.1)
Immature Granulocytes: 0 %
Lymphocytes Absolute: 1.6 x10E3/uL (ref 0.7–3.1)
Lymphs: 29 %
MCH: 27.3 pg (ref 26.6–33.0)
MCHC: 31.1 g/dL — ABNORMAL LOW (ref 31.5–35.7)
MCV: 88 fL (ref 79–97)
Monocytes Absolute: 0.5 x10E3/uL (ref 0.1–0.9)
Monocytes: 9 %
Neutrophils Absolute: 3.1 x10E3/uL (ref 1.4–7.0)
Neutrophils: 56 %
Platelets: 276 x10E3/uL (ref 150–450)
RBC: 4.87 x10E6/uL (ref 3.77–5.28)
RDW: 14.1 % (ref 11.7–15.4)
WBC: 5.5 x10E3/uL (ref 3.4–10.8)

## 2024-04-23 LAB — LIPID PANEL
Chol/HDL Ratio: 2.7 ratio (ref 0.0–4.4)
Cholesterol, Total: 186 mg/dL (ref 100–199)
HDL: 69 mg/dL (ref >39)
LDL Chol Calc (NIH): 97 mg/dL (ref 0–99)
Triglycerides: 113 mg/dL (ref 0–149)
VLDL Cholesterol Cal: 20 mg/dL (ref 5–40)

## 2024-04-23 LAB — VITAMIN B12: Vitamin B-12: 675 pg/mL (ref 232–1245)

## 2024-04-23 LAB — HEMOGLOBIN A1C
Est. average glucose Bld gHb Est-mCnc: 120 mg/dL
Hgb A1c MFr Bld: 5.8 % — ABNORMAL HIGH (ref 4.8–5.6)

## 2024-04-23 LAB — VITAMIN D 25 HYDROXY (VIT D DEFICIENCY, FRACTURES): Vit D, 25-Hydroxy: 45.6 ng/mL (ref 30.0–100.0)

## 2024-04-24 ENCOUNTER — Encounter: Payer: Self-pay | Admitting: Pharmacist

## 2024-04-24 NOTE — Progress Notes (Signed)
   04/24/2024  Patient ID: Deanna Johnson, female   DOB: Aug 30, 1950, 73 y.o.   MRN: [id number]  This patient is appearing on a report for being at risk of failing the adherence measure for cholesterol (statin) medications this calendar year.   Medication: rosuvastatin  5 mg Last fill date: 04/16/2024 for 90 day supply  Insurance report was not up to date. No action needed at this time.   Sharyle Sia, PharmD, [facility] [phone]

## 2024-04-24 NOTE — Progress Notes (Signed)
 This encounter was created in error - please disregard.

## 2024-04-26 ENCOUNTER — Other Ambulatory Visit: Payer: Self-pay | Admitting: Internal Medicine

## 2024-04-26 DIAGNOSIS — I10 Essential (primary) hypertension: Secondary | ICD-10-CM

## 2024-04-29 NOTE — Telephone Encounter (Signed)
 Requested Prescriptions  Pending Prescriptions Disp Refills   hydrochlorothiazide  (HYDRODIURIL ) 25 MG tablet [Pharmacy Med Name: HYDROCHLOROTHIAZIDE  25MG  TABLET] 90 tablet 0    Sig: TAKE (1) TABLET BY MOUTH EVERY DAY     Cardiovascular: Diuretics - Thiazide Passed - 04/29/2024  3:35 PM      Passed - Cr in normal range and within 180 days    Creatinine, Ser  Date Value Ref Range Status  04/22/2024 0.92 0.57 - 1.00 mg/dL Final         Passed - K in normal range and within 180 days    Potassium  Date Value Ref Range Status  04/22/2024 4.2 3.5 - 5.2 mmol/L Final         Passed - Na in normal range and within 180 days    Sodium  Date Value Ref Range Status  04/22/2024 140 134 - 144 mmol/L Final         Passed - Last BP in normal range    BP Readings from Last 1 Encounters:  04/22/24 124/78         Passed - Valid encounter within last 6 months    Recent Outpatient Visits           1 week ago Annual physical exam   [facility], Leita DEL, MD   6 months ago Recurrent major depression in partial remission   [place] [facility], Leita DEL, MD   8 months ago Essential hypertension   [facility] at [facility], Leita DEL, MD

## 2024-05-09 DIAGNOSIS — M542 Cervicalgia: Secondary | ICD-10-CM | POA: Diagnosis not present

## 2024-05-09 DIAGNOSIS — S46001A Unspecified injury of muscle(s) and tendon(s) of the rotator cuff of right shoulder, initial encounter: Secondary | ICD-10-CM | POA: Diagnosis not present

## 2024-05-09 DIAGNOSIS — G5603 Carpal tunnel syndrome, bilateral upper limbs: Secondary | ICD-10-CM | POA: Diagnosis not present

## 2024-05-09 DIAGNOSIS — Z9889 Other specified postprocedural states: Secondary | ICD-10-CM | POA: Diagnosis not present

## 2024-05-09 DIAGNOSIS — Z6841 Body Mass Index (BMI) 40.0 and over, adult: Secondary | ICD-10-CM | POA: Diagnosis not present

## 2024-05-10 ENCOUNTER — Other Ambulatory Visit: Payer: Self-pay | Admitting: Internal Medicine

## 2024-05-10 ENCOUNTER — Other Ambulatory Visit: Payer: Self-pay | Admitting: Orthopedic Surgery

## 2024-05-10 DIAGNOSIS — Z9889 Other specified postprocedural states: Secondary | ICD-10-CM

## 2024-05-10 DIAGNOSIS — S46001A Unspecified injury of muscle(s) and tendon(s) of the rotator cuff of right shoulder, initial encounter: Secondary | ICD-10-CM

## 2024-05-10 DIAGNOSIS — E782 Mixed hyperlipidemia: Secondary | ICD-10-CM

## 2024-05-13 NOTE — Telephone Encounter (Signed)
 Requested Prescriptions  Pending Prescriptions Disp Refills   rosuvastatin  (CRESTOR ) 5 MG tablet [Pharmacy Med Name: ROSUVASTATIN  CALCIUM  5MG  TABLET] 90 tablet 0    Sig: TAKE (1) TABLET BY MOUTH EVERY DAY     Cardiovascular:  Antilipid - Statins 2 Failed - 05/13/2024 10:58 AM      Failed - Lipid Panel in normal range within the last 12 months    Cholesterol, Total  Date Value Ref Range Status  04/22/2024 186 100 - 199 mg/dL Final   LDL Chol Calc (NIH)  Date Value Ref Range Status  04/22/2024 97 0 - 99 mg/dL Final   HDL  Date Value Ref Range Status  04/22/2024 69 >39 mg/dL Final   Triglycerides  Date Value Ref Range Status  04/22/2024 113 0 - 149 mg/dL Final         Passed - Cr in normal range and within 360 days    Creatinine, Ser  Date Value Ref Range Status  04/22/2024 0.92 0.57 - 1.00 mg/dL Final         Passed - Patient is not pregnant      Passed - Valid encounter within last 12 months    Recent Outpatient Visits           3 weeks ago Annual physical exam   [facility] at [facility], Leita DEL, MD   6 months ago Recurrent major depression in partial remission   [place] [facility] at [facility], Leita DEL, MD   8 months ago Essential hypertension   [facility] at [facility], Leita DEL, MD

## 2024-05-16 ENCOUNTER — Encounter: Payer: Self-pay | Admitting: Internal Medicine

## 2024-05-16 NOTE — Telephone Encounter (Signed)
 Please review.  KP

## 2024-05-17 ENCOUNTER — Other Ambulatory Visit: Payer: Self-pay | Admitting: Internal Medicine

## 2024-05-17 DIAGNOSIS — Z8739 Personal history of other diseases of the musculoskeletal system and connective tissue: Secondary | ICD-10-CM

## 2024-05-17 DIAGNOSIS — G5603 Carpal tunnel syndrome, bilateral upper limbs: Secondary | ICD-10-CM | POA: Insufficient documentation

## 2024-05-17 MED ORDER — DULOXETINE HCL 60 MG PO CPEP: 60.0000 mg | ORAL_CAPSULE | Freq: Every day | ORAL | 1 refills | Status: DC

## 2024-05-17 NOTE — Progress Notes (Unsigned)
 Date:  05/17/2024   Name:  Deanna Johnson   DOB:  09-23-1950   MRN:  [id number]   Chief Complaint: No chief complaint on file.  HPI  Review of Systems   Lab Results  Component Value Date   NA 140 04/22/2024   K 4.2 04/22/2024   CO2 25 04/22/2024   GLUCOSE 92 04/22/2024   BUN 21 04/22/2024   CREATININE 0.92 04/22/2024   CALCIUM  9.3 04/22/2024   EGFR 65 04/22/2024   GFRNONAA >60 08/19/2022   Lab Results  Component Value Date   CHOL 186 04/22/2024   HDL 69 04/22/2024   LDLCALC 97 04/22/2024   TRIG 113 04/22/2024   CHOLHDL 2.7 04/22/2024   Lab Results  Component Value Date   TSH 0.018 (L) 04/22/2024   Lab Results  Component Value Date   HGBA1C 5.8 (H) 04/22/2024   Lab Results  Component Value Date   WBC 5.5 04/22/2024   HGB 13.3 04/22/2024   HCT 42.8 04/22/2024   MCV 88 04/22/2024   PLT 276 04/22/2024   Lab Results  Component Value Date   ALT 12 04/22/2024   AST 16 04/22/2024   ALKPHOS 66 04/22/2024   BILITOT 0.5 04/22/2024   Lab Results  Component Value Date   VD25OH 45.6 04/22/2024     Patient Active Problem List   Diagnosis Date Noted   Vitamin D  deficiency 04/22/2024   B12 deficiency due to diet 04/22/2024   Environmental and seasonal allergies 02/10/2023   Status post total knee replacement using cement, left 08/18/2022   Lumbar radiculitis 04/29/2020   Cervical spondylosis with myelopathy 10/25/2018   BMI 40.0-44.9, adult (HCC) 06/27/2018   Hyperlipidemia, mixed 05/05/2017   Hypothyroidism due to Hashimoto's thyroiditis 05/05/2017   Recurrent major depression in partial remission 04/11/2016   Hx of colonic polyp 04/11/2016   Degenerative arthritis of knee 12/31/2014   Essential hypertension 12/31/2014   Impingement syndrome of left shoulder 12/16/2014   H/O fibromyalgia 08/18/2012    No Known Allergies  Past Surgical History:  Procedure Laterality Date   Anterial cervical dissectomy Fusion  02/23/2021   BREAST BIOPSY Right 1987    neg   BREAST SURGERY     RIGHT BREAST BIOPSY   BUNIONECTOMY     CATARACT EXTRACTION W/PHACO Left 01/04/2019   Procedure: CATARACT EXTRACTION PHACO AND INTRAOCULAR LENS PLACEMENT (IOC)  LEFT;  Surgeon: Myrna Adine Anes, MD;  Location: [facility];  Service: Ophthalmology;  Laterality: Left;   CATARACT EXTRACTION W/PHACO Right 02/01/2019   Procedure: CATARACT EXTRACTION PHACO AND INTRAOCULAR LENS PLACEMENT (IOC)  RIGHT;  Surgeon: Myrna Adine Anes, MD;  Location: [facility];  Service: Ophthalmology;  Laterality: Right;   COLONOSCOPY  2011   benign polyps - f/u 2016   COLONOSCOPY WITH PROPOFOL  N/A 07/21/2017   Procedure: COLONOSCOPY WITH PROPOFOL ;  Surgeon: Gaylyn Lunger PENNER, MD;  Location: [facility];  Service: Endoscopy;  Laterality: N/A;   COLONOSCOPY WITH PROPOFOL  N/A 06/05/2023   Procedure: COLONOSCOPY WITH PROPOFOL ;  Surgeon: Maryruth Ole DASEN, MD;  Location: [facility] END[facility]COPY;  Service: Endoscopy;  Laterality: N/A;   EYE SURGERY  June & July 2020   cataract- both eyes   FRACTURE SURGERY  1981   MAXILLARY FACIAL SURGERY   JOINT REPLACEMENT     total left knee replacement   MANDIBLE FRACTURE SURGERY     jaw resection   shoulder arthroscopy Right    SHOULDER ARTHROSCOPY WITH OPEN ROTATOR CUFF REPAIR Left 01/30/2015  Procedure: SHOULDER ARTHROSCOPY WITH POSS OPEN ROTATOR CUFF REPAIR;  Surgeon: Helayne Glenn, MD;  Location: [facility];  Service: Orthopedics;  Laterality: Left;  Release of long head of biceps tendon Subacromial decompression Mini open rotator cuff repair   SPINE SURGERY  February 23, 2021   ACDF surgery C4-C7   TONSILLECTOMY     TOTAL ABDOMINAL HYSTERECTOMY     TOTAL KNEE ARTHROPLASTY Left 08/18/2022   Procedure: TOTAL KNEE ARTHROPLASTY;  Surgeon: Edie Norleen PARAS, MD;  Location: [facility];  Service: Orthopedics;  Laterality: Left;   TUBAL LIGATION  1978    Social History   Tobacco Use   Smoking status: Former    Current  packs/day: 0.00    Average packs/day: 0.3 packs/day for 3.0 years (0.8 ttl pk-yrs)    Types: Cigarettes    Start date: 07/19/1967    Quit date: 07/18/1970    Years since quitting: 53.8   Smokeless tobacco: Never   Tobacco comments:    quit at age 70  Vaping Use   Vaping status: Never Used  Substance Use Topics   Alcohol use: No   Drug use: No     Medication list has been reviewed and updated.  No outpatient medications have been marked as taking for the 05/17/24 encounter (Orders Only) with Justus Leita DEL, MD.       04/22/2024   11:05 AM 10/17/2023    2:36 PM 08/24/2023   10:10 AM 05/23/2023    9:02 AM  GAD 7 : Generalized Anxiety Score  Nervous, Anxious, on Edge 1 1 2 2   Control/stop worrying 1 0 2 0  Worry too much - different things 1 2 2 1   Trouble relaxing 0 1 2 2   Restless 0 0 0 0  Easily annoyed or irritable 0 1 0 0  Afraid - awful might happen 0 1 0 0  Total GAD 7 Score 3 6 8 5   Anxiety Difficulty Not difficult at all Somewhat difficult Very difficult Somewhat difficult       04/22/2024   11:05 AM 10/17/2023    2:36 PM 08/24/2023   10:09 AM  Depression screen PHQ 2/9  Decreased Interest 3 1 3   Down, Depressed, Hopeless 1 1 3   PHQ - 2 Score 4 2 6   Altered sleeping 3 2 3   Tired, decreased energy 3 2 3   Change in appetite 1 0 2  Feeling bad or failure about yourself  0 0 0  Trouble concentrating 0 1 2  Moving slowly or fidgety/restless 0 0 0  Suicidal thoughts 0 0 2  PHQ-9 Score 11 7 18   Difficult doing work/chores Not difficult at all Somewhat difficult Very difficult    BP Readings from Last 3 Encounters:  04/22/24 124/78  10/17/23 122/74  08/24/23 128/74    Physical Exam  Wt Readings from Last 3 Encounters:  04/22/24 225 lb (102.1 kg)  10/17/23 225 lb 2 oz (102.1 kg)  08/24/23 225 lb (102.1 kg)    There were no vitals taken for this visit.  Assessment and Plan:  Problem List Items Addressed This Visit   None   No follow-ups on file.     Leita HILARIO Justus, MD [facility] [place]

## 2024-05-18 ENCOUNTER — Ambulatory Visit
Admission: RE | Admit: 2024-05-18 | Discharge: 2024-05-18 | Disposition: A | Source: Ambulatory Visit | Attending: Orthopedic Surgery | Admitting: Orthopedic Surgery

## 2024-05-18 DIAGNOSIS — Z9889 Other specified postprocedural states: Secondary | ICD-10-CM

## 2024-05-18 DIAGNOSIS — S46001A Unspecified injury of muscle(s) and tendon(s) of the rotator cuff of right shoulder, initial encounter: Secondary | ICD-10-CM

## 2024-05-18 DIAGNOSIS — M67813 Other specified disorders of tendon, right shoulder: Secondary | ICD-10-CM | POA: Diagnosis not present

## 2024-05-27 ENCOUNTER — Encounter: Payer: Self-pay | Admitting: Family Medicine

## 2024-05-27 ENCOUNTER — Ambulatory Visit (INDEPENDENT_AMBULATORY_CARE_PROVIDER_SITE_OTHER): Admitting: Family Medicine

## 2024-05-27 VITALS — BP 134/83 | HR 94 | Temp 98.0°F | Resp 18 | Ht 61.0 in | Wt 224.0 lb

## 2024-05-27 DIAGNOSIS — M1712 Unilateral primary osteoarthritis, left knee: Secondary | ICD-10-CM

## 2024-05-27 DIAGNOSIS — M797 Fibromyalgia: Secondary | ICD-10-CM | POA: Diagnosis not present

## 2024-05-27 DIAGNOSIS — G5603 Carpal tunnel syndrome, bilateral upper limbs: Secondary | ICD-10-CM

## 2024-05-27 DIAGNOSIS — E063 Autoimmune thyroiditis: Secondary | ICD-10-CM

## 2024-05-27 DIAGNOSIS — E782 Mixed hyperlipidemia: Secondary | ICD-10-CM

## 2024-05-27 DIAGNOSIS — I10 Essential (primary) hypertension: Secondary | ICD-10-CM | POA: Diagnosis not present

## 2024-05-27 DIAGNOSIS — M7542 Impingement syndrome of left shoulder: Secondary | ICD-10-CM | POA: Diagnosis not present

## 2024-05-27 NOTE — Assessment & Plan Note (Signed)
 She has already had her right knee replaced.  She is taking Celebrex 100 mg a day and using lidocaine  patches and heat for pain relief.  Encouraged her to use her stationary bicycle for knee strengthening.

## 2024-05-27 NOTE — Assessment & Plan Note (Addendum)
 Being evaluated by orthopedics .  Is scheduled for nerve conduction study

## 2024-05-27 NOTE — Progress Notes (Signed)
 Established Patient Office Visit  Subjective   Patient ID: Deanna Johnson, female    DOB: [birth year]  Age: 73 y.o. MRN: [id number]  Chief Complaint  Patient presents with   Establish Care    HPI Discussed the use of AI scribe software for clinical note transcription with the patient, who gave verbal consent to proceed.  History of Present Illness   Deanna Johnson is a 73 year old female with hypertension and fibromyalgia who presents with shoulder pain and hand numbness.  She experiences shoulder pain, and her MRI showed a torn bursa, bursitis, and severe tendinitis. Her history includes rotator cuff surgery on both shoulders and ACDF surgery on her cervical spine. She uses Celebrex 100 mg once daily for pain, supplemented with Tylenol  as needed, and applies lidocaine  patches and a heating pad for additional relief.  She has numbness of the thumb, index and middle fingers of both hands, and the orthopedic PA , suspects carpal tunnel syndrome. Her activities, such as drawing, painting, and crocheting, are important to her quality of life and are affected by her symptoms.  Hypertension is managed with lisinopril  20 mg and HCTZ 25 mg, with home blood pressure readings averaging 130/76 mmHg. She is currently on Crestor  5 mg for cholesterol management after experiencing muscle pain with previous statins.  Hashimoto's thyroiditis was diagnosed at age 52, and she is on Tyrosin SOL, a liquid form of levothyroxine , which has improved her symptoms. Her thyroid  levels have been difficult to stabilize due to the fluctuating nature of the condition. She is currently taking TIROSINT -SOL 157mcg/ml.    Fibromyalgia is managed with physical activity like walking, gardening, and yoga, though activity is limited due to neck and knee issues. She recently started Cymbalta and reports some improvement in her ability to get out of bed.  She has a history of weight stability within 10 pounds over 25 years,  despite efforts to lose weight. She has been told she is insulin resistant and takes vitamin D  2000 IU daily.     She has glaucoma.   Last mammogram was January 2025.  Last colonoscopy was 2024 and she had no polyps. She had a bone density 03/17/2020: L-spine T-score 2.6, right femur neck T-score equals -0.3.  She doesnot drink ETOH, or use drugs.  She smoked about 5 years as a teenager.    Deanna Johnson is a 73 year old female with hypertension and fibromyalgia who presents with shoulder pain and hand numbness.  She experiences shoulder pain, and her MRI showed a torn bursa, bursitis, and severe tendinitis. Her history includes rotator cuff surgery on both shoulders and ACDF surgery on her neck. She uses Celebrex 100 mg once daily for pain, supplemented with Tylenol  as needed, and applies lidocaine  patches and a heating pad for additional relief.  She has numbness in certain fingers of both hands, and Todd, the PA, suspects carpal tunnel syndrome. Her activities, such as drawing, painting, and crocheting, are important to her quality of life and are affected by her symptoms.  Hypertension is managed with lisinopril  20 mg and HCTZ 25 mg, with home blood pressure readings averaging 130/76 mmHg. She is currently on Crestor  5 mg for cholesterol management after experiencing muscle pain with previous statins.  Hashimoto's thyroiditis was diagnosed at age 49, and she is on Tyrosin SOL, a liquid form of levothyroxine , which has improved her symptoms. Her thyroid  levels have been difficult to stabilize due to the fluctuating nature of  the condition.  Fibromyalgia is managed with physical activity like walking, gardening, and yoga, though activity is limited due to neck and knee issues. She recently started Cymbalta and reports some improvement in her ability to get out of bed.  She has a history of weight stability within 10 pounds over 25 years, despite efforts to lose weight. She has been told she  is insulin resistant and takes vitamin D  2000 IU daily.          Objective:     BP 134/83 (BP Location: Left Arm, Patient Position: Sitting, Cuff Size: Large)   Pulse 94   Temp 98 F (36.7 C) (Oral)   Resp 18   Ht 5' 1 (1.549 m)   Wt 224 lb (101.6 kg)   SpO2 94%   BMI 42.32 kg/m    Physical Exam Vitals and nursing note reviewed.  Constitutional:      Appearance: Normal appearance.  HENT:     Head: Normocephalic and atraumatic.  Eyes:     Conjunctiva/sclera: Conjunctivae normal.  Cardiovascular:     Rate and Rhythm: Normal rate and regular rhythm.  Pulmonary:     Effort: Pulmonary effort is normal.     Breath sounds: Normal breath sounds.  Musculoskeletal:     Right lower leg: No edema.     Left lower leg: No edema.     Comments: Swelling of the MCP joints of the index and middle fingers especially of her right hand.  She does have full range of motion.  Skin:    General: Skin is warm and dry.  Neurological:     Mental Status: She is alert and oriented to person, place, and time.  Psychiatric:        Mood and Affect: Mood normal.        Behavior: Behavior normal.        Thought Content: Thought content normal.        Judgment: Judgment normal.          Results for orders placed or performed in visit on 05/27/24  HM MAMMOGRAPHY  Result Value Ref Range   HM Mammogram 0-4 Bi-Rad 0-4 Bi-Rad, Self Reported Normal      The 10-year ASCVD risk score (Arnett DK, et al., 2019) is: 20.4%    Assessment & Plan:  Impingement syndrome, shoulder, left Assessment & Plan: Pain is significant and impacts daily activities.  MRI shows torn bursa, bursitis and severe tendinitis.  Follow-up with Dr. Tobie, orthopedics.  Continue taking Celebrex 100 mg daily as needed   Bilateral carpal tunnel syndrome Assessment & Plan: Being evaluated by orthopedics .  Is scheduled for nerve conduction study   Fibromyalgia Assessment & Plan: Now taking Cymbalta which is showing  some improvement in symptoms.  Discussed Boswellia as an anti-inflammatory supplement.   Hashimoto's thyroiditis Assessment & Plan: Currently using TIROSINT -SOL.  Through her specialty pharmacy.  On 125 mcg daily and doing well.   Essential hypertension Assessment & Plan: Blood pressure well-controlled on lisinopril  20 mg and HCTZ 25 mg daily.  Does check her blood pressure at home and it typically runs in the 130s over 70s.   Hyperlipidemia, mixed Assessment & Plan: She is on Crestor  5 mg daily and tolerating it.  Current 10-year ASCVD risk is 20.4%.  May ask her to try bempedoic acid.   Morbid obesity (HCC) Assessment & Plan: Lifetime struggle for her.  Fibromyalgia prohibits a lot of activity.  Discussed insulin resistance.  Her insurance will  not cover a GLP-1 agonist as she is not a diabetic.  Discussed Topamax and it is not a good option for her because she has glaucoma.   Primary osteoarthritis of left knee Assessment & Plan: She has already had her right knee replaced.  She is taking Celebrex 100 mg a day and using lidocaine  patches and heat for pain relief.  Encouraged her to use her stationary bicycle for knee strengthening.    General Health Maintenance Up to date on vaccinations including flu, COVID, RSV, shingles, and pneumonia. Last colonoscopy was clear, and bone density is good. Discussed upcoming DTaP booster. - Will consider DTaP booster next year.       Return in about 3 months (around 08/27/2024).    Bray Vickerman K Carlis Blanchard, MD

## 2024-05-27 NOTE — Assessment & Plan Note (Signed)
 Blood pressure well-controlled on lisinopril  20 mg and HCTZ 25 mg daily.  Does check her blood pressure at home and it typically runs in the 130s over 70s.

## 2024-05-27 NOTE — Assessment & Plan Note (Signed)
 She is on Crestor  5 mg daily and tolerating it.  Current 10-year ASCVD risk is 20.4%.  May ask her to try bempedoic acid.

## 2024-05-27 NOTE — Assessment & Plan Note (Signed)
 Currently using TIROSINT -SOL.  Through her specialty pharmacy.  On 125 mcg daily and doing well.

## 2024-05-27 NOTE — Assessment & Plan Note (Signed)
 Lifetime struggle for her.  Fibromyalgia prohibits a lot of activity.  Discussed insulin resistance.  Her insurance will not cover a GLP-1 agonist as she is not a diabetic.  Discussed Topamax and it is not a good option for her because she has glaucoma.

## 2024-05-27 NOTE — Assessment & Plan Note (Signed)
 Pain is significant and impacts daily activities.  MRI shows torn bursa, bursitis and severe tendinitis.  Follow-up with Dr. Tobie, orthopedics.  Continue taking Celebrex 100 mg daily as needed

## 2024-05-27 NOTE — Assessment & Plan Note (Signed)
 Now taking Cymbalta which is showing some improvement in symptoms.  Discussed Boswellia as an anti-inflammatory supplement.

## 2024-05-30 DIAGNOSIS — S46011A Strain of muscle(s) and tendon(s) of the rotator cuff of right shoulder, initial encounter: Secondary | ICD-10-CM | POA: Diagnosis not present

## 2024-05-31 ENCOUNTER — Other Ambulatory Visit: Payer: Self-pay | Admitting: Orthopedic Surgery

## 2024-06-05 ENCOUNTER — Ambulatory Visit

## 2024-06-12 ENCOUNTER — Other Ambulatory Visit: Payer: Self-pay | Admitting: Orthopedic Surgery

## 2024-06-17 ENCOUNTER — Other Ambulatory Visit: Payer: Self-pay

## 2024-06-17 ENCOUNTER — Encounter
Admission: RE | Admit: 2024-06-17 | Discharge: 2024-06-17 | Disposition: A | Source: Ambulatory Visit | Attending: Orthopedic Surgery

## 2024-06-17 DIAGNOSIS — M25511 Pain in right shoulder: Secondary | ICD-10-CM

## 2024-06-17 DIAGNOSIS — I10 Essential (primary) hypertension: Secondary | ICD-10-CM

## 2024-06-17 DIAGNOSIS — Z01810 Encounter for preprocedural cardiovascular examination: Secondary | ICD-10-CM

## 2024-06-17 DIAGNOSIS — Z01812 Encounter for preprocedural laboratory examination: Secondary | ICD-10-CM

## 2024-06-17 HISTORY — DX: Headache, unspecified: R51.9

## 2024-06-17 HISTORY — DX: Pneumonia, unspecified organism: J18.9

## 2024-06-17 NOTE — Patient Instructions (Addendum)
 Your procedure is scheduled on: 06/24/24 - Monday Report to the Registration Desk on the 1st floor of the [facility]. To find out your arrival time, please call [phone] between 1PM - 3PM on: 06/21/24 - Friday If your arrival time is 6:00 am, do not arrive before that time as the [facility] entrance doors do not open until 6:00 am.  REMEMBER: Instructions that are not followed completely may result in serious medical risk, up to and including death; or upon the discretion of your surgeon and anesthesiologist your surgery may need to be rescheduled.  Do not eat food after midnight the night before surgery.  No gum chewing or hard candies.  You may however, drink CLEAR liquids up to 2 hours before you are scheduled to arrive for your surgery. Do not drink anything within 2 hours of your scheduled arrival time.  Clear liquids include: - water  - apple juice without pulp - gatorade (not RED colors) - black coffee or tea (Do NOT add milk or creamers to the coffee or tea) Do NOT drink anything that is not on this list.  One week prior to surgery: Stop Anti-inflammatories (NSAIDS) such as Advil, Aleve, Ibuprofen, Motrin, Naproxen, Naprosyn and Aspirin based products such as Excedrin, Goody's Powder, BC Powder. You may take Tylenol  if needed for pain up until the day of surgery.  Stop ANY OVER THE COUNTER supplements until after surgery.  ON THE DAY OF SURGERY ONLY TAKE THESE MEDICATIONS WITH SIPS OF WATER:  celecoxib (CELEBREX)  DULoxetine  (CYMBALTA )  Levothyroxine  Sodium   No Alcohol for 24 hours before or after surgery.  No Smoking including e-cigarettes for 24 hours before surgery.  No chewable tobacco products for at least 6 hours before surgery.  No nicotine patches on the day of surgery.  Do not use any recreational drugs for at least a week (preferably 2 weeks) before your surgery.  Please be advised that the combination of cocaine and anesthesia may have negative  outcomes, up to and including death. If you test positive for cocaine, your surgery will be cancelled.  On the morning of surgery brush your teeth with toothpaste and water, you may rinse your mouth with mouthwash if you wish. Do not swallow any toothpaste or mouthwash.  Use CHG Soap or wipes as directed on instruction sheet.  Do not wear jewelry, make-up, hairpins, clips or nail polish.  For welded (permanent) jewelry: bracelets, anklets, waist bands, etc.  Please have this removed prior to surgery.  If it is not removed, there is a chance that hospital personnel will need to cut it off on the day of surgery.  Do not wear lotions, powders, or perfumes.   Do not shave body hair from the neck down 48 hours before surgery.  Contact lenses, hearing aids and dentures may not be worn into surgery.  Do not bring valuables to the hospital. [facility] is not responsible for any missing/lost belongings or valuables.   Notify your doctor if there is any change in your medical condition (cold, fever, infection).  Wear comfortable clothing (specific to your surgery type) to the hospital.  After surgery, you can help prevent lung complications by doing breathing exercises.  Take deep breaths and cough every 1-2 hours. Your doctor may order a device called an Incentive Spirometer to help you take deep breaths.  If you are being admitted to the hospital overnight, leave your suitcase in the car. After surgery it may be brought to your room.  In case of increased patient census, it may be necessary for you, the patient, to continue your postoperative care in the Same Day Surgery department.  If you are being discharged the day of surgery, you will not be allowed to drive home. You will need a responsible individual to drive you home and stay with you for 24 hours after surgery.   If you are taking public transportation, you will need to have a responsible individual with you.  Please call the  Pre-admissions Testing Dept. at [phone] if you have any questions about these instructions.  Surgery Visitation Policy:  Patients having surgery or a procedure may have two visitors.  Children under the age of 73 must have an adult with them who is not the patient.  Inpatient Visitation:    Visiting hours are 7 a.m. to 8 p.m. Up to four visitors are allowed at one time in a patient room. The visitors may rotate out with other people during the day.  One visitor age 78 or older may stay with the patient overnight and must be in the room by 8 p.m.   Merchandiser, Retail to address health-related social needs:  [email or web address]                                                                                                             Preparing for Surgery with CHLORHEXIDINE  GLUCONATE (CHG) Soap  Chlorhexidine  Gluconate (CHG) Soap  o An antiseptic cleaner that kills germs and bonds with the skin to continue killing germs even after washing  o Used for showering the night before surgery and morning of surgery  Before surgery, you can play an important role by reducing the number of germs on your skin.  CHG (Chlorhexidine  gluconate) soap is an antiseptic cleanser which kills germs and bonds with the skin to continue killing germs even after washing.  Please do not use if you have an allergy to CHG or antibacterial soaps. If your skin becomes reddened/irritated stop using the CHG.  1. Shower the NIGHT BEFORE SURGERY with CHG soap.  2. If you choose to wash your hair, wash your hair first as usual with your normal shampoo.  3. After shampooing, rinse your hair and body thoroughly to remove the shampoo.  4. Use CHG as you would any other liquid soap. You can apply CHG directly to the skin and wash gently with a clean washcloth.  5. Apply the CHG soap to your body only from the neck down. Do not use on open wounds or open sores. Avoid contact with your  eyes, ears, mouth, and genitals (private parts). Wash face and genitals (private parts) with your normal soap.  6. Wash thoroughly, paying special attention to the area where your surgery will be performed.  7. Thoroughly rinse your body with warm water.  8. Do not shower/wash with your normal soap after using and rinsing off the CHG soap.  9. Do not use lotions, oils, etc., after showering with CHG.  10. Pat yourself dry with a clean  towel.  11. Wear clean pajamas to bed the night before surgery.  12. Place clean sheets on your bed the night of your shower and do not sleep with pets.  13. Do not apply any deodorants/lotions/powders.  14. Please wear clean clothes to the hospital.  15. Remember to brush your teeth with your regular toothpaste.

## 2024-06-18 ENCOUNTER — Inpatient Hospital Stay: Admission: RE | Admit: 2024-06-18 | Discharge: 2024-06-18 | Attending: Orthopedic Surgery

## 2024-06-18 DIAGNOSIS — Z01810 Encounter for preprocedural cardiovascular examination: Secondary | ICD-10-CM | POA: Diagnosis not present

## 2024-06-18 DIAGNOSIS — M67911 Unspecified disorder of synovium and tendon, right shoulder: Secondary | ICD-10-CM | POA: Diagnosis not present

## 2024-06-18 DIAGNOSIS — Z1331 Encounter for screening for depression: Secondary | ICD-10-CM | POA: Diagnosis not present

## 2024-06-18 DIAGNOSIS — I10 Essential (primary) hypertension: Secondary | ICD-10-CM | POA: Diagnosis not present

## 2024-06-18 DIAGNOSIS — Z01812 Encounter for preprocedural laboratory examination: Secondary | ICD-10-CM

## 2024-06-18 DIAGNOSIS — R200 Anesthesia of skin: Secondary | ICD-10-CM | POA: Diagnosis not present

## 2024-06-18 DIAGNOSIS — Z01818 Encounter for other preprocedural examination: Secondary | ICD-10-CM | POA: Insufficient documentation

## 2024-06-18 DIAGNOSIS — G5603 Carpal tunnel syndrome, bilateral upper limbs: Secondary | ICD-10-CM | POA: Diagnosis not present

## 2024-06-18 LAB — SURGICAL PCR SCREEN
MRSA, PCR: NEGATIVE
Staphylococcus aureus: NEGATIVE

## 2024-06-21 NOTE — Anesthesia Preprocedure Evaluation (Signed)
 Anesthesia Evaluation  Patient identified by MRN, date of birth, ID band Patient awake    Reviewed: Allergy & Precautions, H&P , NPO status , Patient's Chart, lab work & pertinent test results  Airway Mallampati: II  TM Distance: >3 FB Neck ROM: full    Dental no notable dental hx.    Pulmonary neg pulmonary ROS, former smoker   Pulmonary exam normal        Cardiovascular hypertension, Normal cardiovascular exam     Neuro/Psych  PSYCHIATRIC DISORDERS  Depression    negative neurological ROS     GI/Hepatic negative GI ROS, Neg liver ROS,,,  Endo/Other  Hypothyroidism    Renal/GU      Musculoskeletal  (+)  Fibromyalgia -  Abdominal  (+) + obese  Peds  Hematology negative hematology ROS (+)   Anesthesia Other Findings Past Medical History: No date: Allergy No date: Arthritis No date: Cataract No date: Complete tear of rotator cuff     Comment:  RIGHT No date: Depression No date: Fibromyalgia No date: Headache No date: Heart murmur     Comment:  slight no problems No date: History of tick-borne disease 05/05/2017: Hyperlipidemia, mixed No date: Hypertension No date: Hypothyroidism 08/08/2022: Nose colonized with MRSA     Comment:  a.) PCR (+) prior to LEFT TKA No date: Pneumonia No date: Shoulder pain, left     Comment:  TORN ROTATOR CUFF  Past Surgical History: 02/23/2021: Anterial cervical dissectomy Fusion 1987: BREAST BIOPSY; Right     Comment:  neg No date: BREAST SURGERY     Comment:  RIGHT BREAST BIOPSY No date: BUNIONECTOMY 01/04/2019: CATARACT EXTRACTION W/PHACO; Left     Comment:  Procedure: CATARACT EXTRACTION PHACO AND INTRAOCULAR               LENS PLACEMENT (IOC)  LEFT;  Surgeon: Myrna Adine Anes,              MD;  Location: [facility];  Service:               Ophthalmology;  Laterality: Left; 02/01/2019: CATARACT EXTRACTION W/PHACO; Right     Comment:  Procedure: CATARACT  EXTRACTION PHACO AND INTRAOCULAR               LENS PLACEMENT (IOC)  RIGHT;  Surgeon: Myrna Adine Anes, MD;  Location: [facility];  Service:               Ophthalmology;  Laterality: Right; 2011: COLONOSCOPY     Comment:  benign polyps - f/u 2016 07/21/2017: COLONOSCOPY WITH PROPOFOL ; N/A     Comment:  Procedure: COLONOSCOPY WITH PROPOFOL ;  Surgeon:               Gaylyn Gladis PENNER, MD;  Location: [facility];                Service: Endoscopy;  Laterality: N/A; 06/05/2023: COLONOSCOPY WITH PROPOFOL ; N/A     Comment:  Procedure: COLONOSCOPY WITH PROPOFOL ;  Surgeon:               Maryruth Ole DASEN, MD;  Location: [facility] ENDOSCOPY;                Service: Endoscopy;  Laterality: N/A; June & July 2020: EYE SURGERY     Comment:  cataract- both eyes 1981: FRACTURE SURGERY     Comment:  MAXILLARY FACIAL SURGERY No  date: JOINT REPLACEMENT     Comment:  total left knee replacement No date: MANDIBLE FRACTURE SURGERY     Comment:  jaw resection No date: shoulder arthroscopy; Right 01/30/2015: SHOULDER ARTHROSCOPY WITH OPEN ROTATOR CUFF REPAIR; Left     Comment:  Procedure: SHOULDER ARTHROSCOPY WITH POSS OPEN ROTATOR               CUFF REPAIR;  Surgeon: Helayne Glenn, MD;  Location:               [facility];  Service: Orthopedics;  Laterality:               Left;  Release of long head of biceps tendon Subacromial              decompression Mini open rotator cuff repair February 23, 2021: SPINE SURGERY     Comment:  ACDF surgery C4-C7 No date: TONSILLECTOMY No date: TOTAL ABDOMINAL HYSTERECTOMY 08/18/2022: TOTAL KNEE ARTHROPLASTY; Left     Comment:  Procedure: TOTAL KNEE ARTHROPLASTY;  Surgeon: Edie Norleen PARAS, MD;  Location: [facility] ORS;  Service: Orthopedics;                Laterality: Left; 1978: TUBAL LIGATION     Reproductive/Obstetrics negative OB ROS                              Anesthesia  Physical Anesthesia Plan  ASA: 2  Anesthesia Plan: General ETT   Post-op Pain Management: Regional block*   Induction: Intravenous  PONV Risk Score and Plan: 2 and Ondansetron , Dexamethasone  and Midazolam   Airway Management Planned: Oral ETT  Additional Equipment:   Intra-op Plan:   Post-operative Plan: Extubation in OR  Informed Consent:      Dental Advisory Given  Plan Discussed with: CRNA and Surgeon  Anesthesia Plan Comments:          Anesthesia Quick Evaluation

## 2024-06-23 MED ORDER — ORAL CARE MOUTH RINSE: 15.0000 mL | Freq: Once | OROMUCOSAL | Status: AC

## 2024-06-23 MED ORDER — ACETAMINOPHEN 500 MG PO TABS
1000.0000 mg | ORAL_TABLET | Freq: Once | ORAL | Status: AC
  Administered 2024-06-24: 1000 mg via ORAL

## 2024-06-23 MED ORDER — LACTATED RINGERS IV SOLN: INTRAVENOUS | Status: DC

## 2024-06-23 MED ORDER — CHLORHEXIDINE GLUCONATE 0.12 % MT SOLN
15.0000 mL | Freq: Once | OROMUCOSAL | Status: AC
  Administered 2024-06-24: 15 mL via OROMUCOSAL

## 2024-06-23 MED ORDER — CEFAZOLIN SODIUM-DEXTROSE 2-4 GM/100ML-% IV SOLN
2.0000 g | INTRAVENOUS | Status: AC
  Administered 2024-06-24: 2 g via INTRAVENOUS

## 2024-06-24 ENCOUNTER — Ambulatory Visit: Payer: Self-pay | Admitting: Urgent Care

## 2024-06-24 ENCOUNTER — Ambulatory Visit: Payer: Self-pay | Admitting: Anesthesiology

## 2024-06-24 ENCOUNTER — Ambulatory Visit

## 2024-06-24 ENCOUNTER — Ambulatory Visit
Admission: RE | Admit: 2024-06-24 | Discharge: 2024-06-24 | Disposition: A | Attending: Orthopedic Surgery | Admitting: Orthopedic Surgery

## 2024-06-24 ENCOUNTER — Encounter: Payer: Self-pay | Admitting: Orthopedic Surgery

## 2024-06-24 ENCOUNTER — Encounter: Admission: RE | Disposition: A | Payer: Self-pay | Source: Home / Self Care | Attending: Orthopedic Surgery

## 2024-06-24 ENCOUNTER — Other Ambulatory Visit: Payer: Self-pay

## 2024-06-24 DIAGNOSIS — F32A Depression, unspecified: Secondary | ICD-10-CM | POA: Diagnosis not present

## 2024-06-24 DIAGNOSIS — S46011A Strain of muscle(s) and tendon(s) of the rotator cuff of right shoulder, initial encounter: Secondary | ICD-10-CM | POA: Diagnosis not present

## 2024-06-24 DIAGNOSIS — Z87891 Personal history of nicotine dependence: Secondary | ICD-10-CM | POA: Diagnosis not present

## 2024-06-24 DIAGNOSIS — G8918 Other acute postprocedural pain: Secondary | ICD-10-CM | POA: Diagnosis not present

## 2024-06-24 DIAGNOSIS — I10 Essential (primary) hypertension: Secondary | ICD-10-CM | POA: Diagnosis not present

## 2024-06-24 HISTORY — PX: BICEPT TENODESIS: SHX5116

## 2024-06-24 HISTORY — PX: SUBACROMIAL DECOMPRESSION: SHX5174

## 2024-06-24 HISTORY — PX: ARTHOSCOPIC ROTAOR CUFF REPAIR: SHX5002

## 2024-06-24 SURGERY — REPAIR, ROTATOR CUFF, ARTHROSCOPIC
Anesthesia: General | Site: Shoulder | Laterality: Right

## 2024-06-24 MED ORDER — FENTANYL CITRATE (PF) 100 MCG/2ML IJ SOLN
INTRAMUSCULAR | Status: AC
  Filled 2024-06-24: qty 2

## 2024-06-24 MED ORDER — FENTANYL CITRATE (PF) 100 MCG/2ML IJ SOLN
INTRAMUSCULAR | Status: DC | PRN
  Administered 2024-06-24: 50 ug via INTRAVENOUS

## 2024-06-24 MED ORDER — EPHEDRINE 5 MG/ML INJ
INTRAVENOUS | Status: AC
  Filled 2024-06-24: qty 5

## 2024-06-24 MED ORDER — ACETAMINOPHEN 500 MG PO TABS
ORAL_TABLET | ORAL | Status: AC
  Filled 2024-06-24: qty 2

## 2024-06-24 MED ORDER — DEXAMETHASONE SOD PHOSPHATE PF 10 MG/ML IJ SOLN
INTRAMUSCULAR | Status: DC | PRN
  Administered 2024-06-24: 10 mg via INTRAVENOUS

## 2024-06-24 MED ORDER — CHLORHEXIDINE GLUCONATE 0.12 % MT SOLN
OROMUCOSAL | Status: AC
  Filled 2024-06-24: qty 15

## 2024-06-24 MED ORDER — ONDANSETRON 4 MG PO TBDP: 4.0000 mg | ORAL_TABLET | Freq: Three times a day (TID) | ORAL | 0 refills | Status: AC | PRN

## 2024-06-24 MED ORDER — OXYCODONE HCL 5 MG/5ML PO SOLN: 5.0000 mg | Freq: Once | ORAL | Status: DC | PRN

## 2024-06-24 MED ORDER — RINGERS IRRIGATION IR SOLN
Status: DC | PRN
  Administered 2024-06-24 (×2): 3000 mL
  Administered 2024-06-24: 2000 mL
  Administered 2024-06-24 (×3): 3000 mL

## 2024-06-24 MED ORDER — ONDANSETRON HCL 4 MG/2ML IJ SOLN
INTRAMUSCULAR | Status: AC
  Filled 2024-06-24: qty 2

## 2024-06-24 MED ORDER — OXYCODONE HCL 5 MG PO TABS: 5.0000 mg | ORAL_TABLET | ORAL | 0 refills | Status: AC | PRN

## 2024-06-24 MED ORDER — BUPIVACAINE HCL (PF) 0.5 % IJ SOLN
INTRAMUSCULAR | Status: DC | PRN
  Administered 2024-06-24: 10 mL

## 2024-06-24 MED ORDER — ACETAMINOPHEN 500 MG PO TABS: 1000.0000 mg | ORAL_TABLET | Freq: Three times a day (TID) | ORAL | 2 refills | Status: AC

## 2024-06-24 MED ORDER — MIDAZOLAM HCL 2 MG/2ML IJ SOLN
INTRAMUSCULAR | Status: AC
  Filled 2024-06-24: qty 2

## 2024-06-24 MED ORDER — BUPIVACAINE HCL (PF) 0.5 % IJ SOLN
INTRAMUSCULAR | Status: AC
  Filled 2024-06-24: qty 30

## 2024-06-24 MED ORDER — PHENYLEPHRINE HCL-NACL 20-0.9 MG/250ML-% IV SOLN
INTRAVENOUS | Status: DC | PRN
  Administered 2024-06-24: 30 ug/min via INTRAVENOUS

## 2024-06-24 MED ORDER — PHENYLEPHRINE HCL-NACL 20-0.9 MG/250ML-% IV SOLN
INTRAVENOUS | Status: AC
  Filled 2024-06-24: qty 250

## 2024-06-24 MED ORDER — BUPIVACAINE HCL (PF) 0.5 % IJ SOLN
INTRAMUSCULAR | Status: AC
  Filled 2024-06-24: qty 10

## 2024-06-24 MED ORDER — BUPIVACAINE LIPOSOME 1.3 % IJ SUSP
INTRAMUSCULAR | Status: AC
  Filled 2024-06-24: qty 10

## 2024-06-24 MED ORDER — LIDOCAINE HCL (CARDIAC) PF 100 MG/5ML IV SOSY
PREFILLED_SYRINGE | INTRAVENOUS | Status: DC | PRN
  Administered 2024-06-24: 50 mg via INTRAVENOUS

## 2024-06-24 MED ORDER — SUGAMMADEX SODIUM 500 MG/5ML IV SOLN
INTRAVENOUS | Status: DC | PRN
  Administered 2024-06-24: 200 mg via INTRAVENOUS

## 2024-06-24 MED ORDER — PHENYLEPHRINE 80 MCG/ML (10ML) SYRINGE FOR IV PUSH (FOR BLOOD PRESSURE SUPPORT)
PREFILLED_SYRINGE | INTRAVENOUS | Status: DC | PRN
  Administered 2024-06-24: 160 ug via INTRAVENOUS
  Administered 2024-06-24 (×3): 80 ug via INTRAVENOUS

## 2024-06-24 MED ORDER — OXYCODONE HCL 5 MG PO TABS: 5.0000 mg | ORAL_TABLET | Freq: Once | ORAL | Status: DC | PRN

## 2024-06-24 MED ORDER — ACETAMINOPHEN 10 MG/ML IV SOLN: 1000.0000 mg | Freq: Once | INTRAVENOUS | Status: DC | PRN

## 2024-06-24 MED ORDER — ROCURONIUM BROMIDE 100 MG/10ML IV SOLN
INTRAVENOUS | Status: DC | PRN
  Administered 2024-06-24: 60 mg via INTRAVENOUS

## 2024-06-24 MED ORDER — ONDANSETRON HCL 4 MG/2ML IJ SOLN
INTRAMUSCULAR | Status: DC | PRN
  Administered 2024-06-24: 4 mg via INTRAVENOUS

## 2024-06-24 MED ORDER — PHENYLEPHRINE 80 MCG/ML (10ML) SYRINGE FOR IV PUSH (FOR BLOOD PRESSURE SUPPORT)
PREFILLED_SYRINGE | INTRAVENOUS | Status: AC
  Filled 2024-06-24: qty 10

## 2024-06-24 MED ORDER — ASPIRIN 325 MG PO TBEC: 325.0000 mg | DELAYED_RELEASE_TABLET | Freq: Every day | ORAL | 0 refills | Status: AC

## 2024-06-24 MED ORDER — EPHEDRINE SULFATE-NACL 50-0.9 MG/10ML-% IV SOSY
PREFILLED_SYRINGE | INTRAVENOUS | Status: DC | PRN
  Administered 2024-06-24 (×2): 5 mg via INTRAVENOUS

## 2024-06-24 MED ORDER — BUPIVACAINE LIPOSOME 1.3 % IJ SUSP
INTRAMUSCULAR | Status: DC | PRN
  Administered 2024-06-24: 10 mL via PERINEURAL

## 2024-06-24 MED ORDER — FENTANYL CITRATE (PF) 100 MCG/2ML IJ SOLN: 25.0000 ug | INTRAMUSCULAR | Status: DC | PRN

## 2024-06-24 MED ORDER — FENTANYL CITRATE (PF) 50 MCG/ML IJ SOSY
PREFILLED_SYRINGE | INTRAMUSCULAR | Status: AC
  Filled 2024-06-24: qty 1

## 2024-06-24 MED ORDER — DROPERIDOL 2.5 MG/ML IJ SOLN: 0.6250 mg | Freq: Once | INTRAMUSCULAR | Status: DC | PRN

## 2024-06-24 MED ORDER — PROPOFOL 10 MG/ML IV BOLUS
INTRAVENOUS | Status: DC | PRN
  Administered 2024-06-24: 160 mg via INTRAVENOUS

## 2024-06-24 MED ORDER — PROPOFOL 10 MG/ML IV BOLUS
INTRAVENOUS | Status: AC
  Filled 2024-06-24: qty 20

## 2024-06-24 MED ORDER — MIDAZOLAM HCL (PF) 2 MG/2ML IJ SOLN
INTRAMUSCULAR | Status: DC | PRN
  Administered 2024-06-24: 2 mg via INTRAVENOUS

## 2024-06-24 MED ORDER — CEFAZOLIN SODIUM-DEXTROSE 2-4 GM/100ML-% IV SOLN
INTRAVENOUS | Status: AC
  Filled 2024-06-24: qty 100

## 2024-06-24 SURGICAL SUPPLY — 65 items
ADAPTER IRRIG TUBE 2 SPIKE SOL (ADAPTER) ×2 IMPLANT
ANCHOR 2.3 SP SGL 1.2 XBRAID (Anchor) IMPLANT
ANCHOR SWIVELOCK BIO 4.75X19.1 (Anchor) IMPLANT
ANCHOR SWIVELOCK SP KL 4.75 (Anchor) IMPLANT
BLADE SHAVER 4.5X7 STR FR (MISCELLANEOUS) ×1 IMPLANT
BNDG ADH 2 X3.75 FABRIC TAN LF (GAUZE/BANDAGES/DRESSINGS) ×1 IMPLANT
BUR ACROMIONIZER 4.0 (BURR) IMPLANT
BUR BR 5.5 WIDE MOUTH (BURR) ×1 IMPLANT
CANNULA PART THRD DISP 5.75X7 (CANNULA) ×1 IMPLANT
CANNULA PARTIAL THREAD 2X7 (CANNULA) IMPLANT
CANNULA TWIST IN 8.25X7CM (CANNULA) ×1 IMPLANT
CANNULA TWIST IN 8.25X9CM (CANNULA) IMPLANT
CHLORAPREP W/TINT 26 (MISCELLANEOUS) ×1 IMPLANT
COOLER ICEMAN CLASSIC (MISCELLANEOUS) ×1 IMPLANT
COVER LIGHT HANDLE STERIS (MISCELLANEOUS) ×1 IMPLANT
DERMABOND ADVANCED .7 DNX12 (GAUZE/BANDAGES/DRESSINGS) IMPLANT
DRAPE INCISE IOBAN 66X45 STRL (DRAPES) ×1 IMPLANT
DRAPE SHEET LG 3/4 BI-LAMINATE (DRAPES) ×1 IMPLANT
DRSG TEGADERM 2-3/8X2-3/4 SM (GAUZE/BANDAGES/DRESSINGS) IMPLANT
DRSG TEGADERM 4X4.75 (GAUZE/BANDAGES/DRESSINGS) ×3 IMPLANT
ELECTRODE REM PT RTRN 9FT ADLT (ELECTROSURGICAL) IMPLANT
GAUZE SPONGE 4X4 12PLY STRL (GAUZE/BANDAGES/DRESSINGS) ×1 IMPLANT
GAUZE XEROFORM 1X8 LF (GAUZE/BANDAGES/DRESSINGS) ×1 IMPLANT
GLOVE BIOGEL PI IND STRL 8 (GLOVE) ×2 IMPLANT
GLOVE SURG SYN 7.0 PF PI (GLOVE) IMPLANT
GLOVE SURG SYN 8.0 PF PI (GLOVE) ×1 IMPLANT
GOWN SRG XL LONG LVL 3 NONREIN (GOWNS) ×2 IMPLANT
GOWN STRL REUS W/ TWL LRG LVL3 (GOWN DISPOSABLE) ×1 IMPLANT
IV LR IRRIG 3000ML ARTHROMATIC (IV SOLUTION) ×4 IMPLANT
KIT CORKSCREW KNTLS 3.9 S/T/P (INSTRUMENTS) IMPLANT
KIT STABILIZATION SHOULDER (MISCELLANEOUS) ×1 IMPLANT
KIT SUTURETAK 3.0 INSERT PERC (KITS) IMPLANT
KIT TURNOVER KIT A (KITS) ×1 IMPLANT
MANIFOLD NEPTUNE II (INSTRUMENTS) ×1 IMPLANT
MASK FACE SPIDER DISP (MASK) ×1 IMPLANT
MAT ABSORB FLUID 56X50 GRAY (MISCELLANEOUS) ×2 IMPLANT
NDL SAFETY ECLIP 18X1.5 (MISCELLANEOUS) ×1 IMPLANT
NDL SCORPION MULTI FIRE (NEEDLE) IMPLANT
NDL SUT 5 .5 CRC TPR PNT MAYO (NEEDLE) IMPLANT
PACK ARTHROSCOPY SHOULDER (MISCELLANEOUS) ×1 IMPLANT
PAD ABD DERMACEA PRESS 5X9 (GAUZE/BANDAGES/DRESSINGS) ×1 IMPLANT
PAD ARMBOARD POSITIONER FOAM (MISCELLANEOUS) ×3 IMPLANT
PAD COLD SHLDR SM WRAP-ON (PAD) ×1 IMPLANT
PASSER SUT FIRSTPASS SELF (INSTRUMENTS) IMPLANT
PASSER SUT SWIFTSTITCH HIP CRT (INSTRUMENTS) IMPLANT
SLING ULTRA II M (MISCELLANEOUS) IMPLANT
SOLN STERILE WATER 500 ML (IV SOLUTION) ×1 IMPLANT
SPONGE T-LAP 18X18 ~~LOC~~+RFID (SPONGE) IMPLANT
STAPLER SKIN PROX 35W (STAPLE) IMPLANT
STRAP SAFETY 5IN WIDE (MISCELLANEOUS) ×1 IMPLANT
SUT ETHILON 3-0 (SUTURE) IMPLANT
SUT LASSO 90 DEG CVD (SUTURE) IMPLANT
SUT LASSO 90 DEG SD STR (SUTURE) IMPLANT
SUT PROLENE 0 CT 2 (SUTURE) IMPLANT
SUT VIC AB 0 CT1 36 (SUTURE) IMPLANT
SUT VIC AB 2-0 CT2 27 (SUTURE) IMPLANT
SUTURE MNCRL 4-0 27XMF (SUTURE) IMPLANT
SUTURE TAPE 1.3 40 TPR END (SUTURE) IMPLANT
SYSTEM IMPL TENODESIS LNT 2.9 (Orthopedic Implant) IMPLANT
TAPE CLOTH 3X10 WHT NS LF (GAUZE/BANDAGES/DRESSINGS) ×1 IMPLANT
TRAP FLUID SMOKE EVACUATOR (MISCELLANEOUS) ×1 IMPLANT
TUBE SET DOUBLEFLO INFLOW (TUBING) ×1 IMPLANT
TUBE SET DOUBLEFLO OUTFLOW (TUBING) ×1 IMPLANT
TUBING CONNECTING 10 (TUBING) IMPLANT
WAND WEREWOLF FLOW 90D (MISCELLANEOUS) ×1 IMPLANT

## 2024-06-24 NOTE — Discharge Instructions (Addendum)
 Post-Op Instructions - Rotator Cuff Repair  1. Bracing: You will wear a shoulder immobilizer or sling for 6 weeks.   2. Driving: No driving for 6 weeks post-op.   3. Activity: No active lifting for 2 months. Wrist, hand, and elbow motion only. Avoid lifting the upper arm away from the body except for hygiene. You are permitted to bend and straighten the elbow passively only (no active elbow motion). You may use your hand and wrist for typing, writing, and managing utensils (cutting food). Do not lift more than a coffee cup for 8 weeks.  When sleeping or resting, inclined positions (recliner chair or wedge pillow) and a pillow under the forearm for support may provide better comfort for up to 4 weeks.  Avoid long distance travel for 4 weeks.  Return to normal activities after rotator cuff repair repair normally takes 6 months on average. If rehab goes very well, may be able to do most activities at 4 months, except overhead or contact sports.  4. Physical Therapy: Begins 3-4 days after surgery, and proceed 1 time per week for the first 6 weeks, then 1-2 times per week from weeks 6-20 post-op.  5. Medications:  - You will be provided a prescription for narcotic pain medicine. After surgery, take 1-2 narcotic tablets every 4 hours if needed for severe pain.  - A prescription for anti-nausea medication will be provided in case the narcotic medicine causes nausea - take 1 tablet every 6 hours only if nauseated.   - Take tylenol  1000 mg (2 Extra Strength tablets or 3 regular strength) every 8 hours for pain.  May decrease or stop tylenol  5 days after surgery if you are having minimal pain. - Take ASA 325mg /day x 2 weeks to help prevent DVTs/PEs (blood clots).  - DO NOT take ANY nonsteroidal anti-inflammatory pain medications (Advil, Motrin, Ibuprofen, Aleve, Naproxen, or Naprosyn). These medicines can inhibit healing of your shoulder repair.    If you are taking prescription medication for anxiety,  depression, insomnia, muscle spasm, chronic pain, or for attention deficit disorder, you are advised that you are at a higher risk of adverse effects with use of narcotics post-op, including narcotic addiction/dependence, depressed breathing, death. If you use non-prescribed substances: alcohol, marijuana, cocaine, heroin, methamphetamines, etc., you are at a higher risk of adverse effects with use of narcotics post-op, including narcotic addiction/dependence, depressed breathing, death. You are advised that taking > 50 morphine milligram equivalents (MME) of narcotic pain medication per day results in twice the risk of overdose or death. For your prescription provided: oxycodone  5 mg - taking more than 6 tablets per day would result in > 50 morphine milligram equivalents (MME) of narcotic pain medication. Be advised that we will prescribe narcotics short-term, for acute post-operative pain only - 3 weeks for major operations such as shoulder repair/reconstruction surgeries.     6. Post-Op Appointment:  Your first post-op appointment will be 10-14 days post-op.  7. Work or School: For most, but not all procedures, we advise staying out of work or school for at least 1 to 2 weeks in order to recover from the stress of surgery and to allow time for healing.   If you need a work or school note this can be provided.   8. Smoking: If you are a smoker, you need to refrain from smoking in the postoperative period. The nicotine in cigarettes will inhibit healing of your shoulder repair and decrease the chance of successful repair. Similarly, nicotine containing  products (gum, patches) should be avoided.   Post-operative Brace: Apply and remove the brace you received as you were instructed to at the time of fitting and as described in detail as the brace's instructions for use indicate.  Wear the brace for the period of time prescribed by your physician.  The brace can be cleaned with soap and water and  allowed to air dry only.  Should the brace result in increased pain, decreased feeling (numbness/tingling), increased swelling or an overall worsening of your medical condition, please contact your doctor immediately.  If an emergency situation occurs as a result of wearing the brace after normal business hours, please dial 911 and seek immediate medical attention.  Let your doctor know if you have any further questions about the brace issued to you. Refer to the shoulder sling instructions for use if you have any questions regarding the correct fit of your shoulder sling.  [facility] Customer Care for Troubleshooting: [phone]  Video that illustrates how to properly use a shoulder sling: Instructions for Proper Use of an Orthopaedic Sling Http://bass.com/  Information for Discharge Teaching: EXPAREL  (bupivacaine  liposome injectable suspension)   Pain relief is important to your recovery. The goal is to control your pain so you can move easier and return to your normal activities as soon as possible after your procedure. Your physician may use several types of medicines to manage pain, swelling, and more.  Your surgeon or anesthesiologist gave you EXPAREL (bupivacaine ) to help control your pain after surgery.  EXPAREL  is a local anesthetic designed to release slowly over an extended period of time to provide pain relief by numbing the tissue around the surgical site. EXPAREL  is designed to release pain medication over time and can control pain for up to 72 hours. Depending on how you respond to EXPAREL , you may require less pain medication during your recovery. EXPAREL  can help reduce or eliminate the need for opioids during the first few days after surgery when pain relief is needed the most. EXPAREL  is not an opioid and is not addictive. It does not cause sleepiness or sedation.   Important! A teal colored band has been placed on your arm with the date, time and  amount of EXPAREL  you have received. Please leave this armband in place for the full 96 hours following administration, and then you may remove the band. If you return to the hospital for any reason within 96 hours following the administration of EXPAREL , the armband provides important information that your health care providers to know, and alerts them that you have received this anesthetic.    Possible side effects of EXPAREL : Temporary loss of sensation or ability to move in the area where medication was injected. Nausea, vomiting, constipation Rarely, numbness and tingling in your mouth or lips, lightheadedness, or anxiety may occur. Call your doctor right away if you think you may be experiencing any of these sensations, or if you have other questions regarding possible side effects.  Follow all other discharge instructions given to you by your surgeon or nurse. Eat a healthy diet and drink plenty of water or other fluids.  SHOULDER SLING IMMOBILIZER   VIDEO Slingshot 2 Shoulder Brace Application - YouTube ---Https://www.porter.info/  INSTRUCTIONS While supporting the injured arm, slide the forearm into the sling. Wrap the adjustable shoulder strap around the neck and shoulders and attach the strap end to the sling using  the "alligator strap tab."  Adjust the shoulder strap to the required length. Position the shoulder pad  behind the neck. To secure the shoulder pad location (optional), pull the shoulder strap away from the shoulder pad, unfold the hook material on the top of the pad, then press the shoulder strap back onto the hook material to secure the pad in place. Attach the closure strap across the open top of the sling. Position the strap so that it holds the arm securely in the sling. Next, attach the thumb strap to the open end of the sling between the thumb and fingers. After sling has been fit, it may be easily removed and reapplied using the quick release  buckle on shoulder strap. If a neutral pillow or 15 abduction pillow is included, place the pillow at the waistline. Attach the sling to the pillow, lining up hook material on the pillow with the loop on sling. Adjust the waist strap to fit.  If waist strap is too long, cut it to fit. Use the small piece of double sided hook material (located on top of the pillow) to secure the strap end. Place the double sided hook material on the inside of the cut strap end and secure it to the waist strap.     If no pillow is included, attach the waist strap to the sling and adjust to fit.    Washing Instructions: Straps and sling must be removed and cleaned regularly depending on your activity level and perspiration. Hand wash straps and sling in cold water with mild detergent, rinse, air dry

## 2024-06-24 NOTE — Anesthesia Procedure Notes (Signed)
 Procedure Name: Intubation Date/Time: 06/24/2024 1:31 PM  Performed by: Elly Pfeiffer, CRNAPre-anesthesia Checklist: Patient identified, Emergency Drugs available, Suction available and Patient being monitored Patient Re-evaluated:Patient Re-evaluated prior to induction Oxygen Delivery Method: Circle system utilized Preoxygenation: Pre-oxygenation with 100% oxygen Induction Type: IV induction Ventilation: Mask ventilation without difficulty Laryngoscope Size: Mac and 4 Grade View: Grade I Tube type: Oral Tube size: 7.0 mm Number of attempts: 1 Airway Equipment and Method: Stylet and Oral airway Placement Confirmation: ETT inserted through vocal cords under direct vision, positive ETCO2 and breath sounds checked- equal and bilateral Secured at: 21 cm Tube secured with: Tape Dental Injury: Teeth and Oropharynx as per pre-operative assessment  Comments: Cords clear; no trauma. CA

## 2024-06-24 NOTE — Op Note (Signed)
 SURGERY DATE: 06/24/2024   PRE-OP DIAGNOSIS:  1. Right subacromial impingement 2. Right biceps tendinopathy 3. Right rotator cuff tear  POST-OP DIAGNOSIS: 1. Right subacromial impingement 2. Right biceps tendinopathy 3. Right rotator cuff tear  PROCEDURES:  1. Right arthroscopic rotator cuff repair (subcapularis + supraspinatus) 2. Right arthroscopic biceps tenodesis 3. Right arthroscopic subacromial decompression 4. Right arthroscopic extensive debridement of shoulder (glenohumeral and subacromial spaces)  SURGEON: Earnestine HILARIO Blanch, MD   ASSISTANT: none   ANESTHESIA: Gen with Exparel  interscalene block   ESTIMATED BLOOD LOSS: 5cc   DRAINS:  none   TOTAL IV FLUIDS: per anesthesia      SPECIMENS: none   IMPLANTS:  - Arthrex 2.7mm PushLock x 1 - Arthrex 4.59mm SwiveLock x 4 - Iconix SPEED double loaded with 1.2 and 2.0mm tape x 2     OPERATIVE FINDINGS:  Examination under anesthesia: A careful examination under anesthesia was performed.  Passive range of motion was: FF: 150; ER at side: 50; ER in abduction: 90; IR in abduction: 45.  Anterior load shift: NT.  Posterior load shift: NT.  Sulcus in neutral: NT.  Sulcus in ER: NT.     Intra-operative findings: A thorough arthroscopic examination of the shoulder was performed.  The findings are: 1. Biceps tendon: Severe split thickness tearing 2. Superior labrum: erythema, type I SLAP tear 3. Posterior labrum and capsule: normal 4. Inferior capsule and inferior recess: normal 5. Glenoid cartilage surface: Small areas of grade 3 degenerative changes, primarily about the inferior glenoid 6. Supraspinatus attachment: full-thickness tear of the supraspinatus with delamination 7. Posterior rotator cuff attachment: normal 8. Humeral head articular cartilage: Focal areas of grade 1 degenerative changes 9. Rotator interval: significant synovitis 10: Subscapularis tendon: Full-thickness tear of the upper border with minimal  retraction 11. Anterior labrum: Mildly degenerative 12. IGHL: normal   OPERATIVE REPORT:    Indications for procedure:  AIRABELLA BARLEY is a 73 y.o. female with an approximately 6-week history that began after holding onto a dog leash throughout the day.  She has had difficulty with overhead motion since that time with sensations of weakness. Clinical exam and MRI were suggestive of a full-thickness rotator cuff tear, biceps tendinopathy, and subacromial impingement. After discussion of risks, benefits, and alternatives to surgery, the patient elected to proceed.    Procedure in detail:   I identified JURI DINNING in the pre-operative holding area.  I marked the operative shoulder with my initials. I reviewed the risks and benefits of the proposed surgical intervention, and the patient wished to proceed.  Anesthesia was then performed with an Exparel  interscalene block.  The patient was transferred to the operative suite and placed in the beach chair position.     Appropriate IV antibiotics were administered prior to incision. The operative upper extremity was then prepped and draped in standard fashion. A time out was performed confirming the correct extremity, correct patient, and correct procedure.    I then created a standard posterior portal with an 11 blade. The glenohumeral joint was easily entered with a blunt trocar and the arthroscope introduced. The findings of diagnostic arthroscopy are described above. I debrided degenerative tissue including the synovitic tissue about the rotator interval and anterior and superior labrum.  Also debrided the inferior glenoid and humeral head cartilaginous surfaces with an oscillating shaver such that there were stable cartilage edges.  I then coagulated the inflamed synovium to obtain hemostasis and reduce the risk of post-operative swelling using an Arthrocare radiofrequency  device.   I then turned my attention to the arthroscopic biceps tenodesis. The  Loop n Tack technique was used to pass a FiberTape through the biceps in a locked fashion adjacent to the biceps anchor.  A hole for a 2.9 mm Arthrex PushLock was drilled in the bicipital groove just superior to the subscapularis tendon insertion.  The biceps tendon was then cut and the biceps anchor complex was debrided down to a stable base on the superior labrum.  The FiberTape was loaded onto the PushLock anchor and impacted into place into the previously drilled hole in the bicipital groove.  This appropriately secured the biceps into the bicipital groove and took it off of tension.    The subscapularis tear was identified.  A superior anterolateral portal was made under needle localization.  A cannula was placed.  The comma tissue indicating the superolateral border of the subscapularis was identified readily.  The tip of the coracoid as well as the conjoined tendon and coracoacromial ligaments were visualized after debriding rotator interval tissue.  Tissue about the subscapularis was released anteriorly, superiorly, and posteriorly to allow for improved mobilization. A suture tape was passed in a mattress fashion. Both strands of suture were then loaded onto a 4.75 mm SwiveLock anchor and placed into the prepared footprint with the arm in a neutral position.  The knotless mechanism was used to further reinforce the upper border.  This construct appropriately reduced the subscapularis tear.  The arm was then internally and externally rotated and the subscapularis was noted to move appropriately with rotation.  The remainder of the suture was then cut.    Next, the arthroscope was then introduced into the subacromial space. A direct lateral portal was created with an 11-blade after spinal needle localization. An extensive subacromial bursectomy and debridement was performed using a combination of the shaver and Arthrocare wand. The entire acromial undersurface was exposed and the CA ligament was  subperiosteally elevated to expose the anterior acromial hook. A burr was used to create a flat anterior and lateral aspect of the acromion, converting it from a Type 2 to a Type 1 acromion. Care was made to keep the deltoid fascia intact.  Next, I created an accessory posterolateral portal to assist with visualization and instrumentation.  I debrided the poor quality edges of the supraspinatus tendon.  This was a U-shaped tear of the supraspinatus.  There is also a delaminated component.  The footprint was prepared appropriately.    I then percutaneously placed one Iconix SPEED medial row anchor along the anterior portion of the tear at the articular margin. Another SPEED anchor was placed along the posterior portion of the tear at the articular margin. I then shuttled all the strands of tape through the rotator cuff just lateral to the musculotendinous junction using a FirstPass suture passer spanning the anterior to posterior extent of the tear. The posterior strands of each suture were passed through an [employer] anchor.  This was placed approximately 2 cm distal to the lateral edge of the footprint in line with the posterior aspect of the tear with appropriate tensioning of each suture prior to final fixation.  Similarly, the anterior strands of each suture were passed through another SwiveLock anchor along the anterior margin of the tear.  The anterior lateral row anchor did not achieve excellent fixation so a buddy 4.50mm anchor was placed just inferior to it.  This did achieve excellent fixation of both anchors.  There was a small anterior  dogear.  The knotless mechanism of the anterior SwiveLock anchors was utilized to reduce the dogear and further reinforce the anterior cable.  This construct allowed for excellent reapproximation of the rotator cuff to its native footprint without undue tension.  Appropriate compression was achieved.  The repair was stable to external and internal rotation.    Fluid was evacuated from the shoulder, and the portals were closed with 3-0 Nylon. Xeroform was applied to the portals. A sterile dressing was applied, followed by a Polar Care sleeve and a SlingShot shoulder immobilizer/sling. The patient was awakened from anesthesia without difficulty and was transferred to the PACU in stable condition.    Of note, assistance from a PA was essential to performing the surgery.  PA was present for the entire surgery.  PA assisted with patient positioning, retraction, instrumentation, and wound closure. The surgery would have been more difficult and had longer operative time without PA assistance.   COMPLICATIONS: none   DISPOSITION: plan for discharge home after recovery in PACU     POSTOPERATIVE PLAN: Remain in sling (except hygiene and elbow/wrist/hand RoM exercises as instructed by PT) x 6 weeks and NWB for this time with subscapularis restrictions. PT to begin 3-4 days after surgery.  Large rotator cuff repair rehab protocol. ASA 325mg  daily x 2 weeks for DVT ppx.

## 2024-06-24 NOTE — Anesthesia Postprocedure Evaluation (Signed)
 Anesthesia Post Note  Patient: Deanna Johnson  Procedure(s) Performed: REPAIR, ROTATOR CUFF, ARTHROSCOPIC (Right: Shoulder) DECOMPRESSION, SUBACROMIAL SPACE (Right: Shoulder) TENODESIS, BICEPS (Right: Shoulder)  Patient location during evaluation: PACU Anesthesia Type: General Level of consciousness: awake and alert Pain management: pain level controlled Vital Signs Assessment: post-procedure vital signs reviewed and stable Respiratory status: spontaneous breathing, nonlabored ventilation and respiratory function stable Cardiovascular status: blood pressure returned to baseline and stable Postop Assessment: no apparent nausea or vomiting Anesthetic complications: no   No notable events documented.   Last Vitals:  Vitals:   06/24/24 1630 06/24/24 1702  BP: 135/78 (!) 154/61  Pulse: 100 89  Resp: 15 16  Temp:  36.6 C  SpO2: 99% 92%    Last Pain:  Vitals:   06/24/24 1702  TempSrc: Temporal  PainSc: 0-No pain                 Fairy POUR Chivonne Rascon

## 2024-06-24 NOTE — H&P (Signed)
 Paper H&P to be scanned into permanent record. H&P reviewed. No significant changes noted.

## 2024-06-24 NOTE — Anesthesia Procedure Notes (Signed)
 Anesthesia Regional Block: Interscalene brachial plexus block   Pre-Anesthetic Checklist: , timeout performed,  Correct Patient, Correct Site, Correct Laterality,  Correct Procedure, Correct Position, site marked,  Risks and benefits discussed,  Surgical consent,  Pre-op evaluation,  At surgeon's request and post-op pain management  Laterality: Upper and Right  Prep: chloraprep       Needles:  Injection technique: Single-shot  Needle Type: Stimiplex     Needle Length: 9cm  Needle Gauge: 22     Additional Needles:   Procedures:,,,, ultrasound used (permanent image in chart),,    Narrative:  Start time: 06/24/2024 1:10 PM End time: 06/24/2024 1:16 PM Injection made incrementally with aspirations every 5 mL.  Performed by: Personally  Anesthesiologist: Vicci Camellia Glatter, MD  Additional Notes: Patient consented for risk and benefits of nerve block including but not limited to nerve damage, failed block, bleeding and infection.  Patient voiced understanding.  Functioning IV was confirmed and monitors were applied.  Timeout done prior to procedure and prior to any sedation being given to the patient.  Patient confirmed procedure site prior to any sedation given to the patient. Sterile prep,hand hygiene and sterile gloves were used.  Minimal sedation used for procedure.  No paresthesia endorsed by patient during the procedure.  Negative aspiration and negative test dose prior to incremental administration of local anesthetic. The patient tolerated the procedure well with no immediate complications.

## 2024-06-24 NOTE — Transfer of Care (Signed)
 Immediate Anesthesia Transfer of Care Note  Patient: Deanna Johnson  Procedure(s) Performed: REPAIR, ROTATOR CUFF, ARTHROSCOPIC (Right: Shoulder) DECOMPRESSION, SUBACROMIAL SPACE (Right: Shoulder) TENODESIS, BICEPS (Right: Shoulder)  Patient Location: PACU  Anesthesia Type:General  Level of Consciousness: drowsy  Airway & Oxygen Therapy: Patient Spontanous Breathing and Patient connected to face mask oxygen  Post-op Assessment: Report given to RN  Post vital signs: stable  Last Vitals:  Vitals Value Taken Time  BP 136/63 06/24/24 15:53  Temp    Pulse 97 06/24/24 15:56  Resp 14 06/24/24 15:56  SpO2 99 % 06/24/24 15:56  Vitals shown include unfiled device data.  Last Pain:  Vitals:   06/24/24 1219  TempSrc: Temporal  PainSc: 3          Complications: No notable events documented.

## 2024-06-25 ENCOUNTER — Encounter: Payer: Self-pay | Admitting: Orthopedic Surgery

## 2024-07-30 ENCOUNTER — Encounter: Payer: Self-pay | Admitting: Family Medicine

## 2024-07-30 DIAGNOSIS — I10 Essential (primary) hypertension: Secondary | ICD-10-CM

## 2024-07-30 DIAGNOSIS — Z8739 Personal history of other diseases of the musculoskeletal system and connective tissue: Secondary | ICD-10-CM

## 2024-07-30 DIAGNOSIS — E782 Mixed hyperlipidemia: Secondary | ICD-10-CM

## 2024-07-30 MED ORDER — LISINOPRIL 20 MG PO TABS: ORAL_TABLET | ORAL | 1 refills | Status: AC

## 2024-07-30 MED ORDER — DULOXETINE HCL 60 MG PO CPEP: 60.0000 mg | ORAL_CAPSULE | Freq: Every day | ORAL | 1 refills | Status: AC

## 2024-07-30 MED ORDER — ROSUVASTATIN CALCIUM 5 MG PO TABS: 5.0000 mg | ORAL_TABLET | Freq: Every day | ORAL | 1 refills | Status: AC

## 2024-07-30 MED ORDER — HYDROCHLOROTHIAZIDE 25 MG PO TABS: ORAL_TABLET | ORAL | 1 refills | Status: AC

## 2024-08-16 ENCOUNTER — Ambulatory Visit

## 2024-08-27 ENCOUNTER — Ambulatory Visit: Admitting: Family Medicine

## 2024-08-28 ENCOUNTER — Ambulatory Visit: Admitting: Family Medicine

## 2024-10-11 ENCOUNTER — Ambulatory Visit
